# Patient Record
Sex: Male | Born: 1950 | Race: White | Hispanic: No | Smoking: Former smoker
Health system: Southern US, Community
[De-identification: ages and names within clinical notes are randomized; demographics above are authoritative.]

## PROBLEM LIST (undated history)

## (undated) DIAGNOSIS — J449 Chronic obstructive pulmonary disease, unspecified: Secondary | ICD-10-CM

## (undated) DIAGNOSIS — I2510 Atherosclerotic heart disease of native coronary artery without angina pectoris: Secondary | ICD-10-CM

## (undated) DIAGNOSIS — N189 Chronic kidney disease, unspecified: Secondary | ICD-10-CM

## (undated) DIAGNOSIS — I509 Heart failure, unspecified: Secondary | ICD-10-CM

## (undated) DIAGNOSIS — M1990 Unspecified osteoarthritis, unspecified site: Secondary | ICD-10-CM

## (undated) DIAGNOSIS — I10 Essential (primary) hypertension: Secondary | ICD-10-CM

## (undated) DIAGNOSIS — E785 Hyperlipidemia, unspecified: Secondary | ICD-10-CM

## (undated) DIAGNOSIS — K219 Gastro-esophageal reflux disease without esophagitis: Secondary | ICD-10-CM

## (undated) HISTORY — PX: CARDIAC CATHETERIZATION: SHX172

## (undated) HISTORY — PX: TONSILLECTOMY: SUR1361

## (undated) HISTORY — DX: Essential (primary) hypertension: I10

## (undated) HISTORY — DX: Atherosclerotic heart disease of native coronary artery without angina pectoris: I25.10

## (undated) HISTORY — DX: Heart failure, unspecified: I50.9

## (undated) HISTORY — PX: KNEE SURGERY: SHX244

---

## 2018-09-02 LAB — BASIC METABOLIC PANEL
BUN: 15 (ref 4–21)
CO2: 25 — AB (ref 13–22)
Chloride: 111 — AB (ref 99–108)
Creatinine: 1.2 (ref 0.6–1.3)
Glucose: 107
Potassium: 4.4 (ref 3.4–5.3)
Sodium: 141 (ref 137–147)

## 2018-09-02 LAB — COMPREHENSIVE METABOLIC PANEL
Calcium: 8.9 (ref 8.7–10.7)
GFR calc non Af Amer: >60

## 2018-09-08 LAB — PULMONARY FUNCTION TEST

## 2018-09-09 LAB — PROTIME-INR: INR: 1 (ref 0.9–1.1)

## 2019-04-20 LAB — PULMONARY FUNCTION TEST

## 2019-09-02 ENCOUNTER — Other Ambulatory Visit: Payer: Self-pay

## 2019-09-02 ENCOUNTER — Ambulatory Visit (INDEPENDENT_AMBULATORY_CARE_PROVIDER_SITE_OTHER): Payer: Medicare Other | Admitting: Family Medicine

## 2019-09-02 ENCOUNTER — Encounter: Payer: Self-pay | Admitting: Family Medicine

## 2019-09-02 VITALS — BP 120/76 | HR 80 | Temp 97.4°F | Ht 69.75 in | Wt 175.0 lb

## 2019-09-02 DIAGNOSIS — I2510 Atherosclerotic heart disease of native coronary artery without angina pectoris: Secondary | ICD-10-CM | POA: Insufficient documentation

## 2019-09-02 DIAGNOSIS — I10 Essential (primary) hypertension: Secondary | ICD-10-CM | POA: Diagnosis not present

## 2019-09-02 DIAGNOSIS — E785 Hyperlipidemia, unspecified: Secondary | ICD-10-CM | POA: Insufficient documentation

## 2019-09-02 DIAGNOSIS — E782 Mixed hyperlipidemia: Secondary | ICD-10-CM | POA: Diagnosis not present

## 2019-09-02 DIAGNOSIS — Z955 Presence of coronary angioplasty implant and graft: Secondary | ICD-10-CM | POA: Diagnosis not present

## 2019-09-02 DIAGNOSIS — J449 Chronic obstructive pulmonary disease, unspecified: Secondary | ICD-10-CM | POA: Diagnosis not present

## 2019-09-02 DIAGNOSIS — J439 Emphysema, unspecified: Secondary | ICD-10-CM | POA: Insufficient documentation

## 2019-09-02 DIAGNOSIS — R911 Solitary pulmonary nodule: Secondary | ICD-10-CM

## 2019-09-02 DIAGNOSIS — I351 Nonrheumatic aortic (valve) insufficiency: Secondary | ICD-10-CM | POA: Insufficient documentation

## 2019-09-02 DIAGNOSIS — I509 Heart failure, unspecified: Secondary | ICD-10-CM | POA: Insufficient documentation

## 2019-09-02 DIAGNOSIS — Z125 Encounter for screening for malignant neoplasm of prostate: Secondary | ICD-10-CM

## 2019-09-02 NOTE — Assessment & Plan Note (Addendum)
Symptoms improved on Breo. Cont treatment. If SOB worsens may refer to pulmonology given prior lung functioning.

## 2019-09-02 NOTE — Assessment & Plan Note (Signed)
Continue dual therapy. Referral to cardiology given extent of stents and recent placement to discuss duration of dual agent therapy. Per outside recommendation - at least 1 year.

## 2019-09-02 NOTE — Assessment & Plan Note (Signed)
BP controled. Wonder if fatigue may due to carvedilol. Cardiology referral - recommend discussing trial of dose decrease with them to see if fatigue improves.

## 2019-09-02 NOTE — Patient Instructions (Addendum)
#  Referral I have placed a referral to a specialist for you. You should receive a phone call from the specialty office. Make sure your voicemail is not full and that if you are able to answer your phone to unknown or new numbers.   It may take up to 2 weeks to hear about the referral. If you do not hear anything in 2 weeks, please call our office and ask to speak with the referral coordinator.   Update pharmacy to let me them know you've changed providers

## 2019-09-02 NOTE — Assessment & Plan Note (Signed)
Reviewed outside imaging - follow-up PET reassuring. Given smoking hx will recommend annual low dose CT screening.

## 2019-09-02 NOTE — Progress Notes (Signed)
Subjective:     Caleb House is a 69 y.o. male presenting for Establish Care (Patient states he would like referral to Cardiologist)     HPI  #Hx of Stent placement x 3 - last year - on dual therapy - on BB - noticing some fatigue lately  #COPD - diagnosed and seen by pulm - hx of smoking - also had lung nodule   Review of Systems   Social History   Tobacco Use  Smoking Status Former Smoker  . Packs/day: 0.75  . Years: 40.00  . Pack years: 30.00  . Types: Cigarettes  . Quit date: 08/23/2018  . Years since quitting: 1.0  Smokeless Tobacco Never Used        Objective:    BP Readings from Last 3 Encounters:  09/02/19 120/76   Wt Readings from Last 3 Encounters:  09/02/19 175 lb (79.4 kg)    BP 120/76 (BP Location: Left Arm, Patient Position: Sitting, Cuff Size: Normal)   Pulse 80   Temp (!) 97.4 F (36.3 C) (Temporal)   Ht 5' 9.75" (1.772 m)   Wt 175 lb (79.4 kg)   SpO2 99%   BMI 25.29 kg/m    Physical Exam Constitutional:      Appearance: Normal appearance. He is not ill-appearing or diaphoretic.  HENT:     Right Ear: External ear normal.     Left Ear: External ear normal.     Nose: Nose normal.  Eyes:     General: No scleral icterus.    Extraocular Movements: Extraocular movements intact.     Conjunctiva/sclera: Conjunctivae normal.  Cardiovascular:     Rate and Rhythm: Normal rate.  Pulmonary:     Effort: Pulmonary effort is normal.  Musculoskeletal:     Cervical back: Neck supple.  Skin:    General: Skin is warm and dry.  Neurological:     Mental Status: He is alert. Mental status is at baseline.  Psychiatric:        Mood and Affect: Mood normal.           Assessment & Plan:   Problem List Items Addressed This Visit      Cardiovascular and Mediastinum   Essential hypertension    BP controled. Wonder if fatigue may due to carvedilol. Cardiology referral - recommend discussing trial of dose decrease with them to see if  fatigue improves.       Relevant Medications   carvedilol (COREG) 3.125 MG tablet   amLODipine (NORVASC) 5 MG tablet   rosuvastatin (CRESTOR) 40 MG tablet   telmisartan (MICARDIS) 40 MG tablet   aspirin EC 81 MG tablet   Other Relevant Orders   Ambulatory referral to Cardiology   Comprehensive metabolic panel   CBC     Respiratory   COPD (chronic obstructive pulmonary disease) (HCC)    Symptoms improved on Breo. Cont treatment. If SOB worsens may refer to pulmonology given prior lung functioning.       Relevant Medications   BREO ELLIPTA 100-25 MCG/INH AEPB     Other   S/P coronary artery stent placement - Primary    Continue dual therapy. Referral to cardiology given extent of stents and recent placement to discuss duration of dual agent therapy. Per outside recommendation - at least 1 year.       Relevant Orders   Ambulatory referral to Cardiology   CBC   Hyperlipidemia   Relevant Medications   carvedilol (COREG) 3.125 MG tablet  amLODipine (NORVASC) 5 MG tablet   rosuvastatin (CRESTOR) 40 MG tablet   telmisartan (MICARDIS) 40 MG tablet   aspirin EC 81 MG tablet   Other Relevant Orders   Lipid panel   CBC   Nodule of lower lobe of right lung    Reviewed outside imaging - follow-up PET reassuring. Given smoking hx will recommend annual low dose CT screening.        Other Visit Diagnoses    Prostate cancer screening       Relevant Orders   PSA, Medicare       Return in about 1 year (around 09/01/2020).  Lynnda Child, MD  This visit occurred during the SARS-CoV-2 public health emergency.  Safety protocols were in place, including screening questions prior to the visit, additional usage of staff PPE, and extensive cleaning of exam room while observing appropriate contact time as indicated for disinfecting solutions.

## 2019-09-09 ENCOUNTER — Encounter: Payer: Self-pay | Admitting: Family Medicine

## 2019-09-09 ENCOUNTER — Encounter: Payer: Self-pay | Admitting: Cardiology

## 2019-09-09 DIAGNOSIS — Z7189 Other specified counseling: Secondary | ICD-10-CM | POA: Insufficient documentation

## 2019-09-09 NOTE — Progress Notes (Deleted)
Cardiology Office Note   Date:  09/09/2019   ID:  Caleb House, DOB Sep 22, 1950, MRN [id number]  PCP:  Lynnda Child, MD  Cardiologist:   No primary care provider on file. Referring:  ***  No chief complaint on file.     History of Present Illness: Caleb House is a 69 y.o. male who presents for ***     Past Medical History:  Diagnosis Date  . Allergy   . CHF (congestive heart failure) (HCC)   . Heart disease     Past Surgical History:  Procedure Laterality Date  . CARDIAC CATHETERIZATION     7 stents placed  . KNEE SURGERY     Right knee  . TONSILLECTOMY       Current Outpatient Medications  Medication Sig Dispense Refill  . acetaminophen (TYLENOL) 500 MG tablet Take 500 mg by mouth every 6 (six) hours as needed.    [name] amLODipine (NORVASC) 5 MG tablet Take 5 mg by mouth daily.    [name] aspirin EC 81 MG tablet Take 81 mg by mouth daily. Swallow whole.    [name] BREO ELLIPTA 100-25 MCG/INH AEPB Inhale 1 puff into the lungs daily.    . carvedilol (COREG) 3.125 MG tablet Take 3.125 mg by mouth 2 (two) times daily.    . clopidogrel (PLAVIX) 75 MG tablet Take 75 mg by mouth daily.    . Multiple Vitamin (MULTIVITAMIN) tablet Take 1 tablet by mouth daily.    . naproxen (NAPROSYN) 500 MG tablet Take 500 mg by mouth 2 (two) times daily with a meal.    . rosuvastatin (CRESTOR) 40 MG tablet Take 40 mg by mouth daily.    [name] telmisartan (MICARDIS) 40 MG tablet Take 40 mg by mouth daily.     No current facility-administered medications for this visit.    Allergies:   Patient has no known allergies.    Social History:  The patient  reports that he quit smoking about 12 months ago. His smoking use included cigarettes. He has a 30.00 pack-year smoking history. He has never used smokeless tobacco. He reports previous alcohol use. He reports that he does not use drugs.   Family History:  The patient's ***family history includes Alcohol abuse in his mother; Depression in his mother;  Heart attack (age of onset: 45) in his paternal grandfather; Heart disease in his brother and father; Suicidality in his maternal grandfather and mother.    ROS:  Please see the history of present illness.   Otherwise, review of systems are positive for {NONE DEFAULTED:18576::"none"}.   All other systems are reviewed and negative.    PHYSICAL EXAM: VS:  There were no vitals taken for this visit. , BMI There is no height or weight on file to calculate BMI. GENERAL:  Well appearing HEENT:  Pupils equal round and reactive, fundi not visualized, oral mucosa unremarkable NECK:  No jugular venous distention, waveform within normal limits, carotid upstroke brisk and symmetric, no bruits, no thyromegaly LYMPHATICS:  No cervical, inguinal adenopathy LUNGS:  Clear to auscultation bilaterally BACK:  No CVA tenderness CHEST:  Unremarkable HEART:  PMI not displaced or sustained,S1 and S2 within normal limits, no S3, no S4, no clicks, no rubs, *** murmurs ABD:  Flat, positive bowel sounds normal in frequency in pitch, no bruits, no rebound, no guarding, no midline pulsatile mass, no hepatomegaly, no splenomegaly EXT:  2 plus pulses throughout, no edema, no cyanosis no clubbing SKIN:  No rashes no nodules  NEURO:  Cranial nerves II through XII grossly intact, motor grossly intact throughout PSYCH:  Cognitively intact, oriented to person place and time    EKG:  EKG {ACTION; IS/IS TDV:76160737} ordered today. The ekg ordered today demonstrates ***   Recent Labs: No results found for requested labs within last 8760 hours.    Lipid Panel No results found for: CHOL, TRIG, HDL, CHOLHDL, VLDL, LDLCALC, LDLDIRECT    Wt Readings from Last 3 Encounters:  09/02/19 175 lb (79.4 kg)      Other studies Reviewed: Additional studies/ records that were reviewed today include: ***. Review of the above records demonstrates:  Please see elsewhere in the note.  ***   ASSESSMENT AND PLAN:  CAD:   ***  DYSLIPIDEMIA:  ***    COVID EDUCATION:  ***    Current medicines are reviewed at length with the patient today.  The patient {ACTIONS; HAS/DOES NOT HAVE:19233} concerns regarding medicines.  The following changes have been made:  {PLAN; NO CHANGE:13088:s}  Labs/ tests ordered today include: *** No orders of the defined types were placed in this encounter.    Disposition:   FU with ***    Signed, Rollene Rotunda, MD  09/09/2019 8:12 PM    [facility]

## 2019-09-10 ENCOUNTER — Other Ambulatory Visit: Payer: Self-pay

## 2019-09-10 ENCOUNTER — Ambulatory Visit (INDEPENDENT_AMBULATORY_CARE_PROVIDER_SITE_OTHER): Payer: Medicare Other | Admitting: Cardiology

## 2019-09-10 ENCOUNTER — Other Ambulatory Visit (INDEPENDENT_AMBULATORY_CARE_PROVIDER_SITE_OTHER): Payer: Medicare Other

## 2019-09-10 ENCOUNTER — Encounter: Payer: Self-pay | Admitting: Cardiology

## 2019-09-10 VITALS — BP 134/80 | HR 56 | Temp 97.5°F | Ht 70.0 in | Wt 180.0 lb

## 2019-09-10 DIAGNOSIS — E782 Mixed hyperlipidemia: Secondary | ICD-10-CM

## 2019-09-10 DIAGNOSIS — Z7189 Other specified counseling: Secondary | ICD-10-CM | POA: Diagnosis not present

## 2019-09-10 DIAGNOSIS — Z125 Encounter for screening for malignant neoplasm of prostate: Secondary | ICD-10-CM

## 2019-09-10 DIAGNOSIS — R0989 Other specified symptoms and signs involving the circulatory and respiratory systems: Secondary | ICD-10-CM

## 2019-09-10 DIAGNOSIS — E785 Hyperlipidemia, unspecified: Secondary | ICD-10-CM

## 2019-09-10 DIAGNOSIS — F17200 Nicotine dependence, unspecified, uncomplicated: Secondary | ICD-10-CM

## 2019-09-10 DIAGNOSIS — I10 Essential (primary) hypertension: Secondary | ICD-10-CM

## 2019-09-10 DIAGNOSIS — I2510 Atherosclerotic heart disease of native coronary artery without angina pectoris: Secondary | ICD-10-CM

## 2019-09-10 DIAGNOSIS — Z955 Presence of coronary angioplasty implant and graft: Secondary | ICD-10-CM

## 2019-09-10 LAB — CBC
HCT: 36.5 % — ABNORMAL LOW (ref 39.0–52.0)
Hemoglobin: 12 g/dL — ABNORMAL LOW (ref 13.0–17.0)
MCHC: 32.9 g/dL (ref 30.0–36.0)
MCV: 89.3 fl (ref 78.0–100.0)
Platelets: 207 10*3/uL (ref 150.0–400.0)
RBC: 4.09 Mil/uL — ABNORMAL LOW (ref 4.22–5.81)
RDW: 14.5 % (ref 11.5–15.5)
WBC: 5.3 10*3/uL (ref 4.0–10.5)

## 2019-09-10 LAB — COMPREHENSIVE METABOLIC PANEL
ALT: 22 U/L (ref 0–53)
AST: 16 U/L (ref 0–37)
Albumin: 4 g/dL (ref 3.5–5.2)
Alkaline Phosphatase: 61 U/L (ref 39–117)
BUN: 27 mg/dL — ABNORMAL HIGH (ref 6–23)
CO2: 24 mEq/L (ref 19–32)
Calcium: 9.1 mg/dL (ref 8.4–10.5)
Chloride: 108 mEq/L (ref 96–112)
Creatinine, Ser: 1.44 mg/dL (ref 0.40–1.50)
GFR: 48.37 mL/min — ABNORMAL LOW (ref >60.00)
Glucose, Bld: 110 mg/dL — ABNORMAL HIGH (ref 70–99)
Potassium: 4.6 mEq/L (ref 3.5–5.1)
Sodium: 138 mEq/L (ref 135–145)
Total Bilirubin: 0.6 mg/dL (ref 0.2–1.2)
Total Protein: 6 g/dL (ref 6.0–8.3)

## 2019-09-10 LAB — LIPID PANEL
Cholesterol: 119 mg/dL (ref 0–200)
HDL: 44 mg/dL (ref >39.00)
LDL Cholesterol: 62 mg/dL (ref 0–99)
NonHDL: 74.66
Total CHOL/HDL Ratio: 3
Triglycerides: 65 mg/dL (ref 0.0–149.0)
VLDL: 13 mg/dL (ref 0.0–40.0)

## 2019-09-10 LAB — PSA, MEDICARE: PSA: 1.57 ng/ml (ref 0.10–4.00)

## 2019-09-10 MED ORDER — ENTRESTO 24-26 MG PO TABS: 1.0000 | ORAL_TABLET | Freq: Two times a day (BID) | ORAL | 11 refills | Status: DC

## 2019-09-10 MED ORDER — AMLODIPINE BESYLATE 2.5 MG PO TABS
5.0000 mg | ORAL_TABLET | Freq: Every day | ORAL | 11 refills | Status: DC
Start: 2019-09-10 — End: 2019-10-22

## 2019-09-10 NOTE — Patient Instructions (Signed)
Medication Instructions:  Stop Micardis Start Entresto 24-26mg , take 1 tablet twice a day Decrease Norvasc to 2.5mg  daily *If you need a refill on your cardiac medications before your next appointment, please call your pharmacy*  Lab Work: None ordered this visit  Testing/Procedures: Your physician has requested that you have an abdominal aorta duplex. During this test, an ultrasound is used to evaluate the aorta. Allow 30 minutes for this exam. Do not eat after midnight the day before and avoid carbonated beverages.  Your physician has requested that you have a carotid duplex. This test is an ultrasound of the carotid arteries in your neck. It looks at blood flow through these arteries that supply the brain with blood. Allow one hour for this exam. There are no restrictions or special instructions.  Follow-Up: At [facility], you and your health needs are our priority.  As part of our continuing mission to provide you with exceptional heart care, we have created designated Provider Care Teams.  These Care Teams include your primary Cardiologist (physician) and Advanced Practice Providers (APPs -  Physician Assistants and Nurse Practitioners) who all work together to provide you with the care you need, when you need it.  Your next appointment:   1 month(s)  The format for your next appointment:   In Person  Provider:   Rollene Rotunda, MD

## 2019-09-10 NOTE — Progress Notes (Signed)
Cardiology Office Note   Date:  09/10/2019   ID:  Caleb House, DOB Dec 13, 1950, MRN [id number]  PCP:  Lynnda Child, MD  Cardiologist:   No primary care provider on file. Referring:  Lynnda Child, MD  Chief Complaint  Patient presents with  . Coronary Artery Disease      History of Present Illness: Caleb House is a 69 y.o. male who is referred by Lynnda Child, MD for evaluation of CAD.  He came for [facility].  I do have some records to review.  He has had severe coronary artery disease.  He had LAD and RCA chronic total obstruction and high-grade disease in his circumflex treated with PCI on September 09, 2018.[name] He was turned down for bypass because of his severe COPD.  His ejection fraction in July 2020 was 38%.  He did have PET scanning that demonstrated moderate sized full-thickness defect in the mid to distal anterior wall and apex.  He had viable anterior and lateral wall.  He had LAD CTO revascularization.  He had ADR/Sting-Ray on October 23, 2018.   He had staged RCA CTO revascularization on January 01, 2019.  Of note he could not tolerate Brilinta secondary to shortness of breath.  His ejection fraction was 38%.  I do see labs from the fall and he had normal renal function.  Interestingly the patient never really had symptoms associated with this.  He was just found to have an abnormal EKG.  In retrospect he had some episodes bad heartburn he thought.  He had some chronic mild dyspnea.  However, he never really knew that he had angina.  Since having all of this done he has done well.  He has had some fatigue he thinks related to starting his carvedilol.  He has not been having any chest pressure, neck or arm discomfort.  He has not been having any new shortness of breath, PND or orthopnea.  He has had no weight gain or edema.  He has been exercising routinely using a [employer].    Past Medical History:  Diagnosis Date  . CAD (coronary artery disease)   . CHF (congestive  heart failure) (HCC)   . HTN (hypertension)     Past Surgical History:  Procedure Laterality Date  . CARDIAC CATHETERIZATION     7 stents placed  . KNEE SURGERY     Right knee  . TONSILLECTOMY       Current Outpatient Medications  Medication Sig Dispense Refill  . acetaminophen (TYLENOL) 500 MG tablet Take 500 mg by mouth every 6 (six) hours as needed.    [name] amLODipine (NORVASC) 2.5 MG tablet Take 2 tablets (5 mg total) by mouth daily. 30 tablet 11  . aspirin EC 81 MG tablet Take 81 mg by mouth daily. Swallow whole.    [name] BREO ELLIPTA 100-25 MCG/INH AEPB Inhale 1 puff into the lungs daily.    . carvedilol (COREG) 3.125 MG tablet Take 3.125 mg by mouth 2 (two) times daily.    . clopidogrel (PLAVIX) 75 MG tablet Take 75 mg by mouth daily.    . Multiple Vitamin (MULTIVITAMIN) tablet Take 1 tablet by mouth daily.    . naproxen (NAPROSYN) 500 MG tablet Take 500 mg by mouth 2 (two) times daily with a meal.    . rosuvastatin (CRESTOR) 40 MG tablet Take 40 mg by mouth daily.    . sacubitril-valsartan (ENTRESTO) 24-26 MG Take 1 tablet by mouth 2 (two)  times daily. 60 tablet 11   No current facility-administered medications for this visit.    Allergies:   Patient has no known allergies.    Social History:  The patient  reports that he quit smoking about 12 months ago. His smoking use included cigarettes. He has a 30.00 pack-year smoking history. He has never used smokeless tobacco. He reports previous alcohol use. He reports that he does not use drugs.   Family History:  The patient's family history includes Alcohol abuse in his mother; Depression in his mother; Heart attack (age of onset: 16) in his paternal grandfather; Heart disease in his brother and father; Suicidality in his maternal grandfather and mother.    ROS:  Please see the history of present illness.   Otherwise, review of systems are positive for none.   All other systems are reviewed and negative.    PHYSICAL EXAM: VS:   BP 134/80   Pulse (!) 56   Temp (!) 97.5 F (36.4 C)   Ht 5\' 10"  (1.778 m)   Wt 180 lb (81.6 kg)   SpO2 98%   BMI 25.83 kg/m  , BMI Body mass index is 25.83 kg/m. GENERAL:  Well appearing HEENT:  Pupils equal round and reactive, fundi not visualized, oral mucosa unremarkable NECK:  No jugular venous distention, waveform within normal limits, carotid upstroke brisk and symmetric, positive bruits, no thyromegaly LYMPHATICS:  No cervical, inguinal adenopathy LUNGS:  Clear to auscultation bilaterally BACK:  No CVA tenderness CHEST:  Unremarkable HEART:  PMI not displaced or sustained,S1 and S2 within normal limits, no S3, no S4, no clicks, no rubs, no murmurs ABD:  Flat, positive bowel sounds normal in frequency in pitch, no bruits, no rebound, no guarding, no midline pulsatile mass, no hepatomegaly, no splenomegaly EXT:  2 plus pulses throughout, no edema, no cyanosis no clubbing SKIN:  No rashes no nodules NEURO:  Cranial nerves II through XII grossly intact, motor grossly intact throughout PSYCH:  Cognitively intact, oriented to person place and time    EKG:  EKG is ordered today. The ekg ordered today demonstrates sinus rhythm, rate 56, axis within normal limits, intervals within normal limits, old anteroseptal infarct, lateral T wave inversions, no old EKGs for comparison.   Recent Labs: No results found for requested labs within last 8760 hours.    Lipid Panel No results found for: CHOL, TRIG, HDL, CHOLHDL, VLDL, LDLCALC, LDLDIRECT    Wt Readings from Last 3 Encounters:  09/10/19 180 lb (81.6 kg)  09/02/19 175 lb (79.4 kg)      Other studies Reviewed: Additional studies/ records that were reviewed today include: Extensive review of [facility] records. Review of the above records demonstrates:  Please see elsewhere in the note.     ASSESSMENT AND PLAN:  CAD:   The patient had significant three-vessel coronary disease with complicated revascularization as above.  He  has 7 stents reportedly no I do not have these details I do have some of the records.  I am going to continue DAPT indefinitely.  We are going to continue with aggressive risk reduction.  DYSLIPIDEMIA: He has a lipid profile that is drawn and I will be wanting to review this with an LDL goal less than 70.  CARDIOMYOPATHY: He has an ischemic cardiomyopathy.  I am going to work to reduce his Norvasc and eliminated.  I will start by reducing it to 2.5 mg daily.  I am going to stop his Micardis and start Entresto 24/26  twice daily.  He has normal renal function.  AAA SCREENING: I will order screening because of his smoking history.  BRUITS: We will get carotid Dopplers.  COVID EDUCATION: He has been vaccinated.   Current medicines are reviewed at length with the patient today.  The patient does not have concerns regarding medicines.  The following changes have been made: As above.  Labs/ tests ordered today include:   Orders Placed This Encounter  Procedures  . EKG 12-Lead  . VAS US CAROTID  . VAS [place] AAA DUPLEX     Disposition:   FU with me in one month.     Signed, Rollene Rotunda, MD  09/10/2019 2:31 PM    [facility]

## 2019-09-11 DIAGNOSIS — H3530 Unspecified macular degeneration: Secondary | ICD-10-CM | POA: Insufficient documentation

## 2019-10-07 ENCOUNTER — Other Ambulatory Visit: Payer: Self-pay

## 2019-10-07 ENCOUNTER — Other Ambulatory Visit: Payer: Self-pay | Admitting: Cardiology

## 2019-10-07 ENCOUNTER — Ambulatory Visit (HOSPITAL_COMMUNITY)
Admission: RE | Admit: 2019-10-07 | Discharge: 2019-10-07 | Disposition: A | Discharge status: Home / Self Care | Payer: Medicare Other | Source: Ambulatory Visit | Attending: Cardiology | Admitting: Cardiology

## 2019-10-07 ENCOUNTER — Ambulatory Visit (HOSPITAL_BASED_OUTPATIENT_CLINIC_OR_DEPARTMENT_OTHER)
Admission: RE | Admit: 2019-10-07 | Discharge: 2019-10-07 | Disposition: A | Discharge status: Home / Self Care | Payer: Medicare Other | Source: Ambulatory Visit | Attending: Cardiology | Admitting: Cardiology

## 2019-10-07 DIAGNOSIS — I6523 Occlusion and stenosis of bilateral carotid arteries: Secondary | ICD-10-CM

## 2019-10-07 DIAGNOSIS — R0989 Other specified symptoms and signs involving the circulatory and respiratory systems: Secondary | ICD-10-CM | POA: Diagnosis present

## 2019-10-07 DIAGNOSIS — F17200 Nicotine dependence, unspecified, uncomplicated: Secondary | ICD-10-CM

## 2019-10-07 DIAGNOSIS — I700 Atherosclerosis of aorta: Secondary | ICD-10-CM | POA: Insufficient documentation

## 2019-10-20 NOTE — Progress Notes (Signed)
Cardiology Office Note   Date:  10/22/2019   ID:  Caleb House, DOB [birth year], MRN [id number]  PCP:  Lynnda Child, MD  Cardiologist:   No primary care provider on file. Referring:  Lynnda Child, MD  Chief Complaint  Patient presents with   Cardiomyopathy      History of Present Illness: Caleb House is a 69 y.o. male who is referred by Lynnda Child, MD for evaluation of CAD.  He came for [facility].   He has had severe coronary artery disease.  He had LAD and RCA chronic total obstruction and high-grade disease in his circumflex treated with PCI on September 09, 2018.[name] He was turned down for bypass because of his severe COPD.  His ejection fraction in July 2020 was 38%.  He did have PET scanning that demonstrated moderate sized full-thickness defect in the mid to distal anterior wall and apex.  He had viable anterior and lateral wall.  He had LAD CTO revascularization.  He had ADR/Sting-Ray on October 23, 2018.   He had staged RCA CTO revascularization on January 01, 2019.  Of note he could not tolerate Brilinta secondary to shortness of breath.  His ejection fraction was 38%.  I do see labs from the fall and he had normal renal function.  At my first visit with him I reduced his Norvasc.  Stopped the Micardis and started [employer].  He has felt well since that time.  He denies any cardiovascular symptoms.  He has no new shortness of breath, PND or orthopnea.  He has had no palpitations, presyncope or syncope.  He tolerates the meds as listed despite having a low heart rate.   Past Medical History:  Diagnosis Date   CAD (coronary artery disease)    CHF (congestive heart failure) (HCC)    HTN (hypertension)     Past Surgical History:  Procedure Laterality Date   CARDIAC CATHETERIZATION     7 stents placed   KNEE SURGERY     Right knee   TONSILLECTOMY       Current Outpatient Medications  Medication Sig Dispense Refill   acetaminophen (TYLENOL) 500 MG tablet  Take 500 mg by mouth every 6 (six) hours as needed.     aspirin EC 81 MG tablet Take 81 mg by mouth daily. Swallow whole.     BREO ELLIPTA 100-25 MCG/INH AEPB Inhale 1 puff into the lungs daily.     carvedilol (COREG) 3.125 MG tablet Take 3.125 mg by mouth 2 (two) times daily.     clopidogrel (PLAVIX) 75 MG tablet Take 75 mg by mouth daily.     Multiple Vitamin (MULTIVITAMIN) tablet Take 1 tablet by mouth daily.     naproxen (NAPROSYN) 500 MG tablet Take 500 mg by mouth 2 (two) times daily with a meal.     rosuvastatin (CRESTOR) 40 MG tablet Take 40 mg by mouth daily.     sacubitril-valsartan (ENTRESTO) 49-51 MG Take 1 tablet by mouth 2 (two) times daily. 60 tablet 1   No current facility-administered medications for this visit.    Allergies:   Patient has no known allergies.    ROS:  Please see the history of present illness.   Otherwise, review of systems are positive for none.   All other systems are reviewed and negative.    PHYSICAL EXAM: VS:  BP 132/68    Pulse (!) 46    Ht 5\' 10"  (1.778 m)    Wt  177 lb 12.8 oz (80.6 kg)    SpO2 98%    BMI 25.51 kg/m  , BMI Body mass index is 25.51 kg/m. GENERAL:  Well appearing NECK:  No jugular venous distention, waveform within normal limits, carotid upstroke brisk and symmetric, no bruits, no thyromegaly LUNGS:  Clear to auscultation bilaterally CHEST:  Unremarkable HEART:  PMI not displaced or sustained,S1 and S2 within normal limits, no S3, no S4, no clicks, no rubs, no murmurs ABD:  Flat, positive bowel sounds normal in frequency in pitch, no bruits, no rebound, no guarding, no midline pulsatile mass, no hepatomegaly, no splenomegaly EXT:  2 plus pulses throughout, no edema, no cyanosis no clubbing   EKG:  EKG is not ordered today.   Recent Labs: 09/10/2019: ALT 22; BUN 27; Creatinine, Ser 1.44; Hemoglobin 12.0; Platelets 207.0; Potassium 4.6; Sodium 138    Lipid Panel    Component Value Date/Time   CHOL 119 09/10/2019  0825   TRIG 65.0 09/10/2019 0825   HDL 44.00 09/10/2019 0825   CHOLHDL 3 09/10/2019 0825   VLDL 13.0 09/10/2019 0825   LDLCALC 62 09/10/2019 0825      Wt Readings from Last 3 Encounters:  10/22/19 177 lb 12.8 oz (80.6 kg)  09/10/19 180 lb (81.6 kg)  09/02/19 175 lb (79.4 kg)      Other studies Reviewed: Additional studies/ records that were reviewed today include: Vascular studies Review of the above records demonstrates:  Please see elsewhere in the note.     ASSESSMENT AND PLAN:  CAD:   I will continue DAPT indefinitely.  No change in therapy other than below.   DYSLIPIDEMIA:   LDL was 62.  He will continue the meds as listed.   CARDIOMYOPATHY:    I am going to stop his amlodipine.  I am going to increase his Entresto to 47/51 twice daily.  AAA SCREENING:  He had no evidence of aneurysm.    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  I will follow in Sept of next year.    COVID EDUCATION: He has been vaccinated.     Current medicines are reviewed at length with the patient today.  The patient does not have concerns regarding medicines.  The following changes have been made:  As above  Labs/ tests ordered today include: NA  No orders of the defined types were placed in this encounter.    Disposition:   FU with me in 1 month.     Signed, Rollene Rotunda, MD  10/22/2019 12:25 PM    [facility]

## 2019-10-22 ENCOUNTER — Ambulatory Visit (INDEPENDENT_AMBULATORY_CARE_PROVIDER_SITE_OTHER): Payer: Medicare Other | Admitting: Cardiology

## 2019-10-22 ENCOUNTER — Other Ambulatory Visit: Payer: Self-pay

## 2019-10-22 ENCOUNTER — Encounter: Payer: Self-pay | Admitting: Cardiology

## 2019-10-22 VITALS — BP 132/68 | HR 46 | Ht 70.0 in | Wt 177.8 lb

## 2019-10-22 DIAGNOSIS — Z7189 Other specified counseling: Secondary | ICD-10-CM | POA: Diagnosis not present

## 2019-10-22 DIAGNOSIS — I255 Ischemic cardiomyopathy: Secondary | ICD-10-CM | POA: Diagnosis not present

## 2019-10-22 DIAGNOSIS — I2510 Atherosclerotic heart disease of native coronary artery without angina pectoris: Secondary | ICD-10-CM | POA: Diagnosis not present

## 2019-10-22 DIAGNOSIS — E785 Hyperlipidemia, unspecified: Secondary | ICD-10-CM

## 2019-10-22 MED ORDER — ENTRESTO 49-51 MG PO TABS: 1.0000 | ORAL_TABLET | Freq: Two times a day (BID) | ORAL | 1 refills | Status: DC

## 2019-10-22 NOTE — Patient Instructions (Signed)
Medication Instructions:  STOP amlodipine  INCREASE Entresto to 49/51 mg two times daily  *If you need a refill on your cardiac medications before your next appointment, please call your pharmacy*   Follow-Up: At [facility], you and your health needs are our priority.  As part of our continuing mission to provide you with exceptional heart care, we have created designated Provider Care Teams.  These Care Teams include your primary Cardiologist (physician) and Advanced Practice Providers (APPs -  Physician Assistants and Nurse Practitioners) who all work together to provide you with the care you need, when you need it.  We recommend signing up for the patient portal called "MyChart".  Sign up information is provided on this After Visit Summary.  MyChart is used to connect with patients for Virtual Visits (Telemedicine).  Patients are able to view lab/test results, encounter notes, upcoming appointments, etc.  Non-urgent messages can be sent to your provider as well.   To learn more about what you can do with MyChart, go to [email or web address].    Your next appointment:   1 month(s)  The format for your next appointment:   In Person  Provider:   Rollene Rotunda, MD

## 2019-11-23 ENCOUNTER — Ambulatory Visit: Payer: Medicare Other | Admitting: Cardiology

## 2019-11-23 DIAGNOSIS — I6523 Occlusion and stenosis of bilateral carotid arteries: Secondary | ICD-10-CM | POA: Insufficient documentation

## 2019-11-23 NOTE — Progress Notes (Signed)
Cardiology Office Note   Date:  11/25/2019   ID:  Caleb House, DOB 11-25-1950, MRN [id number]  PCP:  Caleb Child, MD  Cardiologist:   No primary care provider on file. Referring:  Caleb Child, MD  Chief Complaint  Patient presents with  . Cardiomyopathy      History of Present Illness: Caleb House is a 69 y.o. male who is referred by Caleb Child, MD for evaluation of CAD.  He came for [facility].   He has had severe coronary artery disease.  He had LAD and RCA chronic total obstruction and high-grade disease in his circumflex treated with PCI on September 09, 2018.[name] He was turned down for bypass because of his severe COPD.  His ejection fraction in July 2020 was 38%.  He did have PET scanning that demonstrated moderate sized full-thickness defect in the mid to distal anterior wall and apex.  He had viable anterior and lateral wall.  He had LAD CTO revascularization.  He had ADR/Sting-Ray on October 23, 2018.   He had staged RCA CTO revascularization on January 01, 2019.  Of note he could not tolerate Brilinta secondary to shortness of breath.  His ejection fraction was 38%.  I do see labs from the fall and he had normal renal function.  I have been titrating his medications.  At the last visit I increased Entresto.   He did well with this.  He has had no lightheadedness except when he might bend over very quickly.  Otherwise feels well. The patient denies any new symptoms such as chest discomfort, neck or arm discomfort. There has been no new shortness of breath, PND or orthopnea. There have been no reported palpitations, presyncope or syncope.   Past Medical History:  Diagnosis Date  . CAD (coronary artery disease)   . CHF (congestive heart failure) (HCC)   . HTN (hypertension)     Past Surgical History:  Procedure Laterality Date  . CARDIAC CATHETERIZATION     7 stents placed  . KNEE SURGERY     Right knee  . TONSILLECTOMY       Current Outpatient Medications   Medication Sig Dispense Refill  . aspirin EC 81 MG tablet Take 81 mg by mouth daily. Swallow whole.    [name] BREO ELLIPTA 100-25 MCG/INH AEPB Inhale 1 puff into the lungs daily.    . carvedilol (COREG) 3.125 MG tablet Take 3 tablets (9.375 mg total) by mouth 2 (two) times daily. 180 tablet 4  . clopidogrel (PLAVIX) 75 MG tablet Take 75 mg by mouth daily.    . Multiple Vitamin (MULTIVITAMIN) tablet Take 1 tablet by mouth daily.    . rosuvastatin (CRESTOR) 40 MG tablet Take 40 mg by mouth daily.    . sacubitril-valsartan (ENTRESTO) 49-51 MG Take 1 tablet by mouth 2 (two) times daily. 60 tablet 1  . acetaminophen (TYLENOL) 500 MG tablet Take 500 mg by mouth every 6 (six) hours as needed. (Patient not taking: Reported on 11/25/2019)    . naproxen (NAPROSYN) 500 MG tablet Take 500 mg by mouth 2 (two) times daily with a meal. (Patient not taking: Reported on 11/25/2019)     No current facility-administered medications for this visit.    Allergies:   Patient has no known allergies.    ROS:  Please see the history of present illness.   Otherwise, review of systems are positive for none.   All other systems are reviewed and negative.  PHYSICAL EXAM: VS:  BP 122/70   Pulse 62   Ht 5\' 10"  (1.778 m)   Wt 173 lb 3.2 oz (78.6 kg)   SpO2 96%   BMI 24.85 kg/m  , BMI Body mass index is 24.85 kg/m. GENERAL:  Well appearing NECK:  No jugular venous distention, waveform within normal limits, carotid upstroke brisk and symmetric, no bruits, no thyromegaly LUNGS:  Clear to auscultation bilaterally CHEST:  Unremarkable HEART:  PMI not displaced or sustained,S1 and S2 within normal limits, no S3, no S4, no clicks, no rubs, no murmurs ABD:  Flat, positive bowel sounds normal in frequency in pitch, no bruits, no rebound, no guarding, no midline pulsatile mass, no hepatomegaly, no splenomegaly EXT:  2 plus pulses throughout, no edema, no cyanosis no clubbing   EKG:  EKG is not ordered today. NA  Recent  Labs: 09/10/2019: ALT 22; BUN 27; Creatinine, Ser 1.44; Hemoglobin 12.0; Platelets 207.0; Potassium 4.6; Sodium 138    Lipid Panel    Component Value Date/Time   CHOL 119 09/10/2019 0825   TRIG 65.0 09/10/2019 0825   HDL 44.00 09/10/2019 0825   CHOLHDL 3 09/10/2019 0825   VLDL 13.0 09/10/2019 0825   LDLCALC 62 09/10/2019 0825      Wt Readings from Last 3 Encounters:  11/25/19 173 lb 3.2 oz (78.6 kg)  10/22/19 177 lb 12.8 oz (80.6 kg)  09/10/19 180 lb (81.6 kg)      Other studies Reviewed: Additional studies/ records that were reviewed today include: None Review of the above records demonstrates:  Please see elsewhere in the note.     ASSESSMENT AND PLAN:  CAD:   I will continue DAPT indefinitely.   No change in therapy.   DYSLIPIDEMIA:   LDL was 62.  No change in therapy  CARDIOMYOPATHY:    Today we will increase his Entresto.  I would like him to come back in 1 month and see if his heart rate and blood pressure will allow increasing his carvedilol.  When he comes back he should also get a basic metabolic profile.   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  I will follow in Sept of 2022.       Current medicines are reviewed at length with the patient today.  The patient does not have concerns regarding medicines.  The following changes have been made:  As above  Labs/ tests ordered today include:   Orders Placed This Encounter  Procedures  . Basic metabolic panel     Disposition:   FU with me in APP in one month.     Signed, 02-12-1979, MD  11/25/2019 9:33 AM    [facility]

## 2019-11-25 ENCOUNTER — Encounter: Payer: Self-pay | Admitting: Cardiology

## 2019-11-25 ENCOUNTER — Other Ambulatory Visit: Payer: Self-pay

## 2019-11-25 ENCOUNTER — Ambulatory Visit (INDEPENDENT_AMBULATORY_CARE_PROVIDER_SITE_OTHER): Payer: Medicare Other | Admitting: Cardiology

## 2019-11-25 VITALS — BP 122/70 | HR 62 | Ht 70.0 in | Wt 173.2 lb

## 2019-11-25 DIAGNOSIS — E785 Hyperlipidemia, unspecified: Secondary | ICD-10-CM

## 2019-11-25 DIAGNOSIS — I255 Ischemic cardiomyopathy: Secondary | ICD-10-CM | POA: Diagnosis not present

## 2019-11-25 DIAGNOSIS — I6523 Occlusion and stenosis of bilateral carotid arteries: Secondary | ICD-10-CM | POA: Diagnosis not present

## 2019-11-25 MED ORDER — CARVEDILOL 3.125 MG PO TABS
9.3750 mg | ORAL_TABLET | Freq: Two times a day (BID) | ORAL | 4 refills | Status: DC
Start: 2019-11-25 — End: 2020-01-07

## 2019-11-25 NOTE — Patient Instructions (Addendum)
Medication Instructions:    increase dose of Carvedilol 9.375 twice a day   ( 3 tablets each time)   *If you need a refill on your cardiac medications before your next appointment, please call your pharmacy*   Lab Work: Bmp in one month  If you have labs (blood work) drawn today and your tests are completely normal, you will receive your results only by: [name] MyChart Message (if you have MyChart) OR . A paper copy in the mail If you have any lab test that is abnormal or we need to change your treatment, we will call you to review the results.   Testing/Procedures: Not needed   Follow-Up: At [facility], you and your health needs are our priority.  As part of our continuing mission to provide you with exceptional heart care, we have created designated Provider Care Teams.  These Care Teams include your primary Cardiologist (physician) and Advanced Practice Providers (APPs -  Physician Assistants and Nurse Practitioners) who all work together to provide you with the care you need, when you need it.  We recommend signing up for the patient portal called "MyChart".  Sign up information is provided on this After Visit Summary.  MyChart is used to connect with patients for Virtual Visits (Telemedicine).  Patients are able to view lab/test results, encounter notes, upcoming appointments, etc.  Non-urgent messages can be sent to your provider as well.   To learn more about what you can do with MyChart, go to [email or web address].    Your next appointment:   1 month(s)  The format for your next appointment:   In Person  Provider:   You may see  or one of the following Advanced Practice Providers on your designated Care Team:    Theodore Demark, PA-C  Joni Reining, DNP, ANP

## 2019-12-19 ENCOUNTER — Other Ambulatory Visit: Payer: Self-pay | Admitting: Cardiology

## 2020-01-06 DIAGNOSIS — I429 Cardiomyopathy, unspecified: Secondary | ICD-10-CM | POA: Insufficient documentation

## 2020-01-06 DIAGNOSIS — I6529 Occlusion and stenosis of unspecified carotid artery: Secondary | ICD-10-CM | POA: Insufficient documentation

## 2020-01-06 LAB — BASIC METABOLIC PANEL
BUN/Creatinine Ratio: 14 (ref 10–24)
BUN: 24 mg/dL (ref 8–27)
CO2: 20 mmol/L (ref 20–29)
Calcium: 9.2 mg/dL (ref 8.6–10.2)
Chloride: 108 mmol/L — ABNORMAL HIGH (ref 96–106)
Creatinine, Ser: 1.73 mg/dL — ABNORMAL HIGH (ref 0.76–1.27)
GFR calc Af Amer: 45 mL/min/{1.73_m2} — ABNORMAL LOW (ref >59)
GFR calc non Af Amer: 39 mL/min/{1.73_m2} — ABNORMAL LOW (ref >59)
Glucose: 93 mg/dL (ref 65–99)
Potassium: 5.3 mmol/L — ABNORMAL HIGH (ref 3.5–5.2)
Sodium: 142 mmol/L (ref 134–144)

## 2020-01-06 NOTE — Progress Notes (Signed)
Cardiology Office Note   Date:  01/07/2020   ID:  Caleb House, DOB 09-Jun-1950, MRN [id number]  PCP:  Lynnda Child, MD  Cardiologist:   No primary care provider on file. Referring:  Lynnda Child, MD  Chief Complaint  Patient presents with  . Dizziness      History of Present Illness: Caleb House is a 69 y.o. male who is referred by Lynnda Child, MD for evaluation of CAD.  He came for [facility]   He has had severe coronary artery disease.  He had LAD and RCA chronic total obstruction and high-grade disease in his circumflex treated with PCI on September 09, 2018.[name] He was turned down for bypass because of his severe COPD.  His ejection fraction in July 2020 was 38%.  He did have PET scanning that demonstrated moderate sized full-thickness defect in the mid to distal anterior wall and apex.  He had viable anterior and lateral wall.  He had LAD CTO revascularization.  He had ADR/Sting-Ray on October 23, 2018.   He had staged RCA CTO revascularization on January 01, 2019.  Of note he could not tolerate Brilinta secondary to shortness of breath.  His ejection fraction was 38%.  I do see labs from the fall and he had normal renal function.  I have been titrating his medications.  At the last visit I increased Entresto and beta blocker. He has been getting lightheaded and has to stand up slowly with his beta-blocker. His heart rate has been low. He is not had any presyncope or syncope. He has no palpitations. He is not having any chest pressure, neck or arm discomfort. He has no new shortness of breath, PND or orthopnea. He has no weight gain or edema.  Past Medical History:  Diagnosis Date  . CAD (coronary artery disease)   . CHF (congestive heart failure) (HCC)   . HTN (hypertension)     Past Surgical History:  Procedure Laterality Date  . CARDIAC CATHETERIZATION     7 stents placed  . KNEE SURGERY     Right knee  . TONSILLECTOMY       Current Outpatient Medications   Medication Sig Dispense Refill  . acetaminophen (TYLENOL) 500 MG tablet Take 500 mg by mouth every 6 (six) hours as needed.    [name] aspirin EC 81 MG tablet Take 81 mg by mouth daily. Swallow whole.    [name] BREO ELLIPTA 100-25 MCG/INH AEPB Inhale 1 puff into the lungs daily.    . clopidogrel (PLAVIX) 75 MG tablet Take 75 mg by mouth daily.    [name] ENTRESTO 49-51 MG TAKE 1 TABLET BY MOUTH TWICE A DAY 60 tablet 10  . Multiple Vitamin (MULTIVITAMIN) tablet Take 1 tablet by mouth daily.    . naproxen (NAPROSYN) 500 MG tablet Take 500 mg by mouth 2 (two) times daily with a meal.    . rosuvastatin (CRESTOR) 40 MG tablet Take 40 mg by mouth daily.    . carvedilol (COREG) 6.25 MG tablet Take 1 tablet (6.25 mg total) by mouth 2 (two) times daily. 90 tablet 3   No current facility-administered medications for this visit.    Allergies:   Patient has no known allergies.    ROS:  Please see the history of present illness.   Otherwise, review of systems are positive for none.   All other systems are reviewed and negative.    PHYSICAL EXAM: VS:  BP 123/74   Pulse [name])  55   Temp (!) 95.9 F (35.5 C)   Ht 5\' 10"  (1.778 m)   Wt 179 lb 6.4 oz (81.4 kg)   SpO2 95%   BMI 25.74 kg/m  , BMI Body mass index is 25.74 kg/m. GENERAL:  Well appearing NECK:  No jugular venous distention, waveform within normal limits, carotid upstroke brisk and symmetric, no bruits, no thyromegaly LUNGS:  Clear to auscultation bilaterally CHEST:  Unremarkable HEART:  PMI not displaced or sustained,S1 and S2 within normal limits, no S3, no S4, no clicks, no rubs, no murmurs ABD:  Flat, positive bowel sounds normal in frequency in pitch, no bruits, no rebound, no guarding, no midline pulsatile mass, no hepatomegaly, no splenomegaly EXT:  2 plus pulses throughout, no edema, no cyanosis no clubbing    EKG:  EKG is not ordered today.   Recent Labs: 09/10/2019: ALT 22; Hemoglobin 12.0; Platelets 207.0 01/05/2020: BUN 24;  Creatinine, Ser 1.73; Potassium 5.3; Sodium 142    Lipid Panel    Component Value Date/Time   CHOL 119 09/10/2019 0825   TRIG 65.0 09/10/2019 0825   HDL 44.00 09/10/2019 0825   CHOLHDL 3 09/10/2019 0825   VLDL 13.0 09/10/2019 0825   LDLCALC 62 09/10/2019 0825      Wt Readings from Last 3 Encounters:  01/07/20 179 lb 6.4 oz (81.4 kg)  11/25/19 173 lb 3.2 oz (78.6 kg)  10/22/19 177 lb 12.8 oz (80.6 kg)      Other studies Reviewed: Additional studies/ records that were reviewed today include: Labs Review of the above records demonstrates:  Please see elsewhere in the note.     ASSESSMENT AND PLAN:  CAD:   The patient has no new sypmtoms.  No further cardiovascular testing is indicated.  We will continue with aggressive risk reduction and meds as listed  He will be on DAPT indefinitely.    DYSLIPIDEMIA:   LDL was 62 with HDL of 44.   CARDIOMYOPATHY:      Today we will increase his Entresto.  I would like him to come back in 1 month and see if his heart rate and blood pressure will allow increasing his carvedilol.  When he comes back he should also get a basic metabolic profile. I think I have to back off on his carvedilol to 6.25 twice daily as he is dizzy and bradycardic. I am going to try to increase his Entresto to 97/103 twice daily if his creat comes back normal or only mildly increased today.    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   I will follow in Sept of 2022.     CKD:  Creat and potassuim were elevated on labs two days ago.  I will repeat a BMET this morning.  He needs to take increased oral hydration as he drinks barely any fluid.     Current medicines are reviewed at length with the patient today.  The patient does not have concerns regarding medicines.  The following changes have been made:  As above  Labs/ tests ordered today include:    Orders Placed This Encounter  Procedures  . Basic metabolic panel     Disposition:    FU with me in one month.    Signed, 02-12-1979, MD  01/07/2020 8:57 AM    [facility]

## 2020-01-07 ENCOUNTER — Other Ambulatory Visit: Payer: Self-pay

## 2020-01-07 ENCOUNTER — Ambulatory Visit (INDEPENDENT_AMBULATORY_CARE_PROVIDER_SITE_OTHER): Payer: Medicare Other | Admitting: Cardiology

## 2020-01-07 ENCOUNTER — Encounter: Payer: Self-pay | Admitting: Cardiology

## 2020-01-07 VITALS — BP 123/74 | HR 55 | Temp 95.9°F | Ht 70.0 in | Wt 179.4 lb

## 2020-01-07 DIAGNOSIS — I429 Cardiomyopathy, unspecified: Secondary | ICD-10-CM | POA: Diagnosis not present

## 2020-01-07 DIAGNOSIS — I255 Ischemic cardiomyopathy: Secondary | ICD-10-CM | POA: Diagnosis not present

## 2020-01-07 DIAGNOSIS — I6529 Occlusion and stenosis of unspecified carotid artery: Secondary | ICD-10-CM

## 2020-01-07 DIAGNOSIS — E785 Hyperlipidemia, unspecified: Secondary | ICD-10-CM

## 2020-01-07 DIAGNOSIS — Z79899 Other long term (current) drug therapy: Secondary | ICD-10-CM

## 2020-01-07 DIAGNOSIS — I25118 Atherosclerotic heart disease of native coronary artery with other forms of angina pectoris: Secondary | ICD-10-CM

## 2020-01-07 LAB — BASIC METABOLIC PANEL
BUN/Creatinine Ratio: 16 (ref 10–24)
BUN: 27 mg/dL (ref 8–27)
CO2: 21 mmol/L (ref 20–29)
Calcium: 9.2 mg/dL (ref 8.6–10.2)
Chloride: 108 mmol/L — ABNORMAL HIGH (ref 96–106)
Creatinine, Ser: 1.64 mg/dL — ABNORMAL HIGH (ref 0.76–1.27)
GFR calc Af Amer: 48 mL/min/{1.73_m2} — ABNORMAL LOW (ref >59)
GFR calc non Af Amer: 41 mL/min/{1.73_m2} — ABNORMAL LOW (ref >59)
Glucose: 104 mg/dL — ABNORMAL HIGH (ref 65–99)
Potassium: 4.6 mmol/L (ref 3.5–5.2)
Sodium: 142 mmol/L (ref 134–144)

## 2020-01-07 MED ORDER — CARVEDILOL 6.25 MG PO TABS: 6.2500 mg | ORAL_TABLET | Freq: Two times a day (BID) | ORAL | 3 refills | Status: DC

## 2020-01-07 NOTE — Patient Instructions (Signed)
Medication Instructions:  Decrease Carvedilol to 6.25mg  twice a day *If you need a refill on your cardiac medications before your next appointment, please call your pharmacy*  Lab Work: Your physician recommends that you return for lab work today (BMP)  Testing/Procedures: None ordered this visit  Follow-Up: At [facility], you and your health needs are our priority.  As part of our continuing mission to provide you with exceptional heart care, we have created designated Provider Care Teams.  These Care Teams include your primary Cardiologist (physician) and Advanced Practice Providers (APPs -  Physician Assistants and Nurse Practitioners) who all work together to provide you with the care you need, when you need it.  Your next appointment:   1 month(s)  The format for your next appointment:   In Person  Provider:   Rollene Rotunda, MD

## 2020-01-08 ENCOUNTER — Ambulatory Visit: Payer: Medicare Other | Admitting: Adult Health

## 2020-01-08 ENCOUNTER — Ambulatory Visit: Payer: Medicare Other | Admitting: Cardiology

## 2020-01-11 ENCOUNTER — Telehealth: Payer: Self-pay

## 2020-01-11 MED ORDER — ENTRESTO 97-103 MG PO TABS: 1.0000 | ORAL_TABLET | Freq: Two times a day (BID) | ORAL | 11 refills | Status: DC

## 2020-01-11 NOTE — Telephone Encounter (Signed)
-----   Message from Rollene Rotunda, MD sent at 01/08/2020 12:07 PM EST ----- Creat is better and potassium not elevated .  He can start increased Entresto.  Call Mr. Scarfo with the results and send results to Lynnda Child, MD

## 2020-01-11 NOTE — Telephone Encounter (Signed)
Spoke with patient. Informed patient of lab results and ordered new Entresto 97-103 dosage. Prescription sent to preferred pharmacy. No further questions.

## 2020-02-08 ENCOUNTER — Ambulatory Visit: Payer: Medicare Other | Admitting: Cardiology

## 2020-02-17 ENCOUNTER — Other Ambulatory Visit: Payer: Self-pay | Admitting: Cardiology

## 2020-03-16 NOTE — Progress Notes (Signed)
Cardiology Office Note   Date:  03/17/2020   ID:  Caleb House, DOB 1950-04-15, MRN [id number]  PCP:  Caleb Child, MD  Cardiologist:   No primary care provider on file. Referring:  Caleb Child, MD  Chief Complaint  Patient presents with  . Cardiomyopathy      History of Present Illness: Caleb House is a 70 y.o. male who is referred by Caleb Child, MD for evaluation of CAD.  He came for [facility]   He has had severe coronary artery disease.  He had LAD and RCA chronic total obstruction and high-grade disease in his circumflex treated with PCI on September 09, 2018.[name] He was turned down for bypass because of his severe COPD.  His ejection fraction in July 2020 was 38%.  He did have PET scanning that demonstrated moderate sized full-thickness defect in the mid to distal anterior wall and apex.  He had viable anterior and lateral wall.  He had LAD CTO revascularization.  He had ADR/Sting-Ray on October 23, 2018.   He had staged RCA CTO revascularization on January 01, 2019.  Of note he could not tolerate Brilinta secondary to shortness of breath.  His ejection fraction was 38%.    I have been titrating his medications.   At the last visit I increased his Entresto.   He did okay with this.  He has had a little bit of orthostatic dizziness but this passes quickly and he is careful not to move quickly.  He denies any new shortness of breath, PND or orthopnea.  He said no palpitations, presyncope or syncope.  He has had no chest pressure, neck or arm discomfort.   Past Medical History:  Diagnosis Date  . CAD (coronary artery disease)   . CHF (congestive heart failure) (HCC)   . HTN (hypertension)     Past Surgical History:  Procedure Laterality Date  . CARDIAC CATHETERIZATION     7 stents placed  . KNEE SURGERY     Right knee  . TONSILLECTOMY       Current Outpatient Medications  Medication Sig Dispense Refill  . acetaminophen (TYLENOL) 500 MG tablet Take 500 mg by  mouth every 6 (six) hours as needed.    [name] aspirin EC 81 MG tablet Take 81 mg by mouth daily. Swallow whole.    [name] BREO ELLIPTA 100-25 MCG/INH AEPB Inhale 1 puff into the lungs daily.    . Calcium Carbonate Antacid (ALKA-SELTZER ANTACID PO) Take by mouth.    . carvedilol (COREG) 6.25 MG tablet Take 1 tablet (6.25 mg total) by mouth 2 (two) times daily. 90 tablet 3  . clopidogrel (PLAVIX) 75 MG tablet Take 75 mg by mouth daily.    . Multiple Vitamins-Minerals (IMMUNE SUPPORT PO) Take by mouth.    . Multiple Vitamins-Minerals (PRESERVISION AREDS 2) CHEW Chew by mouth.    . naproxen (NAPROSYN) 500 MG tablet Take 500 mg by mouth 2 (two) times daily with a meal.    . rosuvastatin (CRESTOR) 40 MG tablet Take 40 mg by mouth daily.    . sacubitril-valsartan (ENTRESTO) 97-103 MG Take 1 tablet by mouth 2 (two) times daily. 60 tablet 11  . spironolactone (ALDACTONE) 25 MG tablet Take 0.5 tablets (12.5 mg total) by mouth daily. 45 tablet 3   No current facility-administered medications for this visit.    Allergies:   Brilinta [ticagrelor]    ROS:  Please see the history of present illness.   Otherwise,  review of systems are positive for none.   All other systems are reviewed and negative.    PHYSICAL EXAM: VS:  BP 124/80   Pulse (!) 48   Ht 5\' 11"  (1.803 m)   Wt 181 lb (82.1 kg)   SpO2 98%   BMI 25.24 kg/m  , BMI Body mass index is 25.24 kg/m. GENERAL:  Well appearing NECK:  No jugular venous distention, waveform within normal limits, carotid upstroke brisk and symmetric, no bruits, no thyromegaly LUNGS:  Clear to auscultation bilaterally CHEST:  Unremarkable HEART:  PMI not displaced or sustained,S1 and S2 within normal limits, no S3, no S4, no clicks, no rubs, no murmurs ABD:  Flat, positive bowel sounds normal in frequency in pitch, no bruits, no rebound, no guarding, no midline pulsatile mass, no hepatomegaly, no splenomegaly EXT:  2 plus pulses throughout, no edema, no cyanosis no  clubbing   EKG:  EKG is  ordered today. Sinus bradycardia, rate 48, low voltage in the limb and chest leads, old anteroseptal infarct, lateral T wave inversions unchanged from previous.  Recent Labs: 09/10/2019: ALT 22; Hemoglobin 12.0; Platelets 207.0 01/07/2020: BUN 27; Creatinine, Ser 1.64; Potassium 4.6; Sodium 142    Lipid Panel    Component Value Date/Time   CHOL 119 09/10/2019 0825   TRIG 65.0 09/10/2019 0825   HDL 44.00 09/10/2019 0825   CHOLHDL 3 09/10/2019 0825   VLDL 13.0 09/10/2019 0825   LDLCALC 62 09/10/2019 0825      Wt Readings from Last 3 Encounters:  03/17/20 181 lb (82.1 kg)  01/07/20 179 lb 6.4 oz (81.4 kg)  11/25/19 173 lb 3.2 oz (78.6 kg)      Other studies Reviewed: Additional studies/ records that were reviewed today include: Labs Review of the above records demonstrates:  Please see elsewhere in the note.     ASSESSMENT AND PLAN:  CAD:     The patient has no new sypmtoms.  No further cardiovascular testing is indicated.  We will continue with aggressive risk reduction and meds as listed.  He will be on DAPT indefinitely given the extent of his disease.  DYSLIPIDEMIA:   LDL was 62 no change in therapy.   CARDIOMYOPATHY:    We had a long discussion about goal-directed medical therapy.  He does have some mild renal insufficiency.  However, I think is very reasonable to try spironolactone.    I will follow guidelines for spironolactone follow up in heart failure.  (Check potassium levels and  renal function 3--4 days and 1 week, then at least monthly for first 3 months and every 3 months thereafter after initiation of spironolactone.)  We talked about this at length.  If he gets dizzy or has any progressive renal insufficiency we will need to back off.  We talked briefly about SGLT2 inhibitors and he thinks that would probably be cost prohibitive.  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  I will follow this up in September  2022.   CKD:  Creat and potassium were stable in December.  Again this will be followed very closely as above.   Current medicines are reviewed at length with the patient today.  The patient does not have concerns regarding medicines.  The following changes have been made:  As above  Labs/ tests ordered today include:    Orders Placed This Encounter  Procedures  . Basic metabolic panel  . Basic metabolic panel  . Basic  metabolic panel  . EKG 12-Lead     Disposition:   FU with me in 6 weeks   Signed, Rollene Rotunda, MD  03/17/2020 10:43 AM    [facility]

## 2020-03-17 ENCOUNTER — Other Ambulatory Visit: Payer: Self-pay

## 2020-03-17 ENCOUNTER — Encounter: Payer: Self-pay | Admitting: Cardiology

## 2020-03-17 ENCOUNTER — Ambulatory Visit (INDEPENDENT_AMBULATORY_CARE_PROVIDER_SITE_OTHER): Payer: Medicare Other | Admitting: Cardiology

## 2020-03-17 VITALS — BP 124/80 | HR 48 | Ht 71.0 in | Wt 181.0 lb

## 2020-03-17 DIAGNOSIS — I10 Essential (primary) hypertension: Secondary | ICD-10-CM

## 2020-03-17 DIAGNOSIS — I509 Heart failure, unspecified: Secondary | ICD-10-CM | POA: Diagnosis not present

## 2020-03-17 DIAGNOSIS — Z79899 Other long term (current) drug therapy: Secondary | ICD-10-CM | POA: Diagnosis not present

## 2020-03-17 DIAGNOSIS — I25118 Atherosclerotic heart disease of native coronary artery with other forms of angina pectoris: Secondary | ICD-10-CM

## 2020-03-17 MED ORDER — SPIRONOLACTONE 25 MG PO TABS: 12.5000 mg | ORAL_TABLET | Freq: Every day | ORAL | 3 refills | Status: DC

## 2020-03-17 NOTE — Patient Instructions (Signed)
Medication Instructions:  Start 12.5mg  of Spironolactone daily *If you need a refill on your cardiac medications before your next appointment, please call your pharmacy*  Lab Work: Your physician recommends that you return for lab work: 4 days after starting spironolactone - 2/28 1 week after having lab work - 3/7 1 month after previous lab work - 4/7  If you have labs (blood work) drawn today and your tests are completely normal, you will receive your results only by: [name] MyChart Message (if you have MyChart) OR . A paper copy in the mail If you have any lab test that is abnormal or we need to change your treatment, we will call you to review the results.  Follow-Up: At [facility], you and your health needs are our priority.  As part of our continuing mission to provide you with exceptional heart care, we have created designated Provider Care Teams.  These Care Teams include your primary Cardiologist (physician) and Advanced Practice Providers (APPs -  Physician Assistants and Nurse Practitioners) who all work together to provide you with the care you need, when you need it.   Your next appointment:   6 week(s)  The format for your next appointment:   In Person  Provider:   Rollene Rotunda, MD

## 2020-03-23 LAB — BASIC METABOLIC PANEL
BUN/Creatinine Ratio: 16 (ref 10–24)
BUN: 28 mg/dL — ABNORMAL HIGH (ref 8–27)
CO2: 20 mmol/L (ref 20–29)
Calcium: 9.4 mg/dL (ref 8.6–10.2)
Chloride: 105 mmol/L (ref 96–106)
Creatinine, Ser: 1.7 mg/dL — ABNORMAL HIGH (ref 0.76–1.27)
Glucose: 99 mg/dL (ref 65–99)
Potassium: 5 mmol/L (ref 3.5–5.2)
Sodium: 140 mmol/L (ref 134–144)
eGFR: 43 mL/min/{1.73_m2} — ABNORMAL LOW (ref >59)

## 2020-03-30 LAB — BASIC METABOLIC PANEL
BUN/Creatinine Ratio: 17 (ref 10–24)
BUN: 33 mg/dL — ABNORMAL HIGH (ref 8–27)
CO2: 17 mmol/L — ABNORMAL LOW (ref 20–29)
Calcium: 8.6 mg/dL (ref 8.6–10.2)
Chloride: 108 mmol/L — ABNORMAL HIGH (ref 96–106)
Creatinine, Ser: 1.96 mg/dL — ABNORMAL HIGH (ref 0.76–1.27)
Glucose: 114 mg/dL — ABNORMAL HIGH (ref 65–99)
Potassium: 4.7 mmol/L (ref 3.5–5.2)
Sodium: 139 mmol/L (ref 134–144)
eGFR: 36 mL/min/{1.73_m2} — ABNORMAL LOW (ref >59)

## 2020-04-16 ENCOUNTER — Telehealth: Payer: Self-pay | Admitting: Cardiology

## 2020-04-16 ENCOUNTER — Telehealth: Payer: Self-pay | Admitting: Physician Assistant

## 2020-04-16 LAB — BASIC METABOLIC PANEL
BUN/Creatinine Ratio: 19 (ref 10–24)
BUN: 39 mg/dL — ABNORMAL HIGH (ref 8–27)
CO2: 17 mmol/L — ABNORMAL LOW (ref 20–29)
Calcium: 9.6 mg/dL (ref 8.6–10.2)
Chloride: 107 mmol/L — ABNORMAL HIGH (ref 96–106)
Creatinine, Ser: 2.07 mg/dL — ABNORMAL HIGH (ref 0.76–1.27)
Glucose: 89 mg/dL (ref 65–99)
Potassium: 6.1 mmol/L (ref 3.5–5.2)
Sodium: 139 mmol/L (ref 134–144)
eGFR: 34 mL/min/{1.73_m2} — ABNORMAL LOW (ref >59)

## 2020-04-16 MED ORDER — LOKELMA 10 G PO PACK: 10.0000 g | PACK | Freq: Once | ORAL | 0 refills | Status: AC

## 2020-04-16 NOTE — Telephone Encounter (Signed)
Called patient and updated with Dr. Jenene Slicker recommendation.  Patient already took his spironolactone this morning.  He will stop starting tomorrow.   Wrote spironolactone as allergy. Sent lokelma prescription to requested pharmacy. Advised to avoid leafy green vegetables until reevaluation of abnormal labs. He will call office Monday morning to arrange blood work.

## 2020-04-16 NOTE — Telephone Encounter (Signed)
Received page from [facility] regarding K+ 6.1. Notes reviewed from earlier in the morning. Caleb House addressed hyperkalemia with the patient per instructions noted by Dr. Antoine Poche.

## 2020-04-17 ENCOUNTER — Other Ambulatory Visit: Payer: Self-pay | Admitting: *Deleted

## 2020-04-17 DIAGNOSIS — E875 Hyperkalemia: Secondary | ICD-10-CM

## 2020-04-18 ENCOUNTER — Other Ambulatory Visit: Payer: Medicare Other

## 2020-04-18 LAB — BASIC METABOLIC PANEL
BUN/Creatinine Ratio: 17 (ref 10–24)
BUN: 34 mg/dL — ABNORMAL HIGH (ref 8–27)
CO2: 18 mmol/L — ABNORMAL LOW (ref 20–29)
Calcium: 9.1 mg/dL (ref 8.6–10.2)
Chloride: 107 mmol/L — ABNORMAL HIGH (ref 96–106)
Creatinine, Ser: 2 mg/dL — ABNORMAL HIGH (ref 0.76–1.27)
Glucose: 111 mg/dL — ABNORMAL HIGH (ref 65–99)
Potassium: 5.1 mmol/L (ref 3.5–5.2)
Sodium: 139 mmol/L (ref 134–144)
eGFR: 35 mL/min/{1.73_m2} — ABNORMAL LOW (ref >59)

## 2020-05-16 NOTE — Progress Notes (Signed)
Cardiology Office Note   Date:  05/17/2020   ID:  Caleb House, DOB 1951/01/03, MRN [id number]  PCP:  Lynnda Child, MD  Cardiologist:   No primary care provider on file. Referring:  Lynnda Child, MD  Chief Complaint  Patient presents with  . Follow-up  . Coronary Artery Disease      History of Present Illness: Caleb House is a 70 y.o. male who is referred by Lynnda Child, MD for evaluation of CAD.  He came for [facility].   He has had severe coronary artery disease.  He had LAD and RCA chronic total obstruction and high-grade disease in his circumflex treated with PCI on September 09, 2018.[name] He was turned down for bypass because of his severe COPD.  His ejection fraction in July 2020 was 38%.  He did have PET scanning that demonstrated moderate sized full-thickness defect in the mid to distal anterior wall and apex.  He had viable anterior and lateral wall.  He had LAD CTO revascularization.  He had ADR/Sting-Ray on October 23, 2018.   He had staged RCA CTO revascularization on January 01, 2019.  Of note he could not tolerate Brilinta secondary to shortness of breath.  His ejection fraction was 38%.   At the last visit I tried to start spironolactone but he had hyperkalemia.    Since I last saw him he has felt well.  He denies any new cardiovascular symptoms.  He is not having any new palpitations, presyncope or syncope.  He said he had side pain when he was taking the spironolactone.  Past Medical History:  Diagnosis Date  . CAD (coronary artery disease)   . CHF (congestive heart failure) (HCC)   . HTN (hypertension)     Past Surgical History:  Procedure Laterality Date  . CARDIAC CATHETERIZATION     7 stents placed  . KNEE SURGERY     Right knee  . TONSILLECTOMY       Current Outpatient Medications  Medication Sig Dispense Refill  . acetaminophen (TYLENOL) 500 MG tablet Take 500 mg by mouth every 6 (six) hours as needed.    [name] aspirin EC 81 MG tablet Take 81 mg  by mouth daily. Swallow whole.    [name] BREO ELLIPTA 100-25 MCG/INH AEPB Inhale 1 puff into the lungs daily.    . Calcium Carbonate Antacid (ALKA-SELTZER ANTACID PO) Take by mouth.    . carvedilol (COREG) 6.25 MG tablet Take 1 tablet (6.25 mg total) by mouth 2 (two) times daily. 90 tablet 3  . clopidogrel (PLAVIX) 75 MG tablet Take 75 mg by mouth daily.    . Multiple Vitamins-Minerals (PRESERVISION AREDS 2) CHEW Chew by mouth.    . rosuvastatin (CRESTOR) 40 MG tablet Take 40 mg by mouth daily.    . sacubitril-valsartan (ENTRESTO) 97-103 MG Take 1 tablet by mouth 2 (two) times daily. 60 tablet 11  . Multiple Vitamins-Minerals (IMMUNE SUPPORT PO) Take by mouth.     No current facility-administered medications for this visit.    Allergies:   Brilinta [ticagrelor] and Spironolactone    ROS:  Please see the history of present illness.   Otherwise, review of systems are positive for none.   All other systems are reviewed and negative.    PHYSICAL EXAM: VS:  BP 120/68   Pulse (!) 58   Ht 5\' 10"  (1.778 m)   Wt 177 lb 6.4 oz (80.5 kg)   SpO2 97%   BMI 25.45  kg/m  , BMI Body mass index is 25.45 kg/m. GENERAL:  Well appearing NECK:  No jugular venous distention, waveform within normal limits, carotid upstroke brisk and symmetric, no bruits, no thyromegaly LUNGS:  Clear to auscultation bilaterally CHEST:  Unremarkable HEART:  PMI not displaced or sustained,S1 and S2 within normal limits, no S3, no S4, no clicks, no rubs, no murmurs ABD:  Flat, positive bowel sounds normal in frequency in pitch, no bruits, no rebound, no guarding, no midline pulsatile mass, no hepatomegaly, no splenomegaly EXT:  2 plus pulses throughout, no edema, no cyanosis no clubbing   EKG:  EKG is not ordered today.   Recent Labs: 09/10/2019: ALT 22; Hemoglobin 12.0; Platelets 207.0 04/18/2020: BUN 34; Creatinine, Ser 2.00; Potassium 5.1; Sodium 139    Lipid Panel    Component Value Date/Time   CHOL 119 09/10/2019  0825   TRIG 65.0 09/10/2019 0825   HDL 44.00 09/10/2019 0825   CHOLHDL 3 09/10/2019 0825   VLDL 13.0 09/10/2019 0825   LDLCALC 62 09/10/2019 0825      Wt Readings from Last 3 Encounters:  05/17/20 177 lb 6.4 oz (80.5 kg)  03/17/20 181 lb (82.1 kg)  01/07/20 179 lb 6.4 oz (81.4 kg)      Other studies Reviewed: Additional studies/ records that were reviewed today include: Labs Review of the above records demonstrates:  Please see elsewhere in the note.     ASSESSMENT AND PLAN:  CAD:    He has no new anginal symptoms.  No change in therapy.   DYSLIPIDEMIA:   LDL was 62.  He will continue the meds as listed.   CARDIOMYOPATHY:      He has not tolerated spironolactone because of hyperkalemia.  He does not want to consider SGLT2 inhibitor because of cost.  He really does not want to titrate meds further and I think he is on maximal therapy that he would tolerate.  I am going to check an echocardiogram.  He would consider an ICD if his EF is less than 35%.   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  I will follow this up in September 2022.    CKD:   I will check a renal function panel today.   Current medicines are reviewed at length with the patient today.  The patient does not have concerns regarding medicines.  The following changes have been made:  None  Labs/ tests ordered today include:    Orders Placed This Encounter  Procedures  . Basic metabolic panel  . ECHOCARDIOGRAM COMPLETE     Disposition:   FU with me in 3 months.    Signed, Rollene Rotunda, MD  05/17/2020 3:45 PM    [facility]

## 2020-05-17 ENCOUNTER — Ambulatory Visit (INDEPENDENT_AMBULATORY_CARE_PROVIDER_SITE_OTHER): Payer: Medicare Other | Admitting: Cardiology

## 2020-05-17 ENCOUNTER — Encounter: Payer: Self-pay | Admitting: Cardiology

## 2020-05-17 ENCOUNTER — Other Ambulatory Visit: Payer: Self-pay

## 2020-05-17 VITALS — BP 120/68 | HR 58 | Ht 70.0 in | Wt 177.4 lb

## 2020-05-17 DIAGNOSIS — E785 Hyperlipidemia, unspecified: Secondary | ICD-10-CM | POA: Diagnosis not present

## 2020-05-17 DIAGNOSIS — I255 Ischemic cardiomyopathy: Secondary | ICD-10-CM | POA: Diagnosis not present

## 2020-05-17 DIAGNOSIS — I25118 Atherosclerotic heart disease of native coronary artery with other forms of angina pectoris: Secondary | ICD-10-CM | POA: Diagnosis not present

## 2020-05-17 DIAGNOSIS — I6523 Occlusion and stenosis of bilateral carotid arteries: Secondary | ICD-10-CM | POA: Diagnosis not present

## 2020-05-17 NOTE — Patient Instructions (Addendum)
Medication Instructions:  Your physician recommends that you continue on your current medications as directed. Please refer to the Current Medication list given to you today.  *If you need a refill on your cardiac medications before your next appointment, please call your pharmacy*  Lab Work: Your physician recommends that you return for lab work TODAY:   BMET If you have labs (blood work) drawn today and your tests are completely normal, you will receive your results only by: [name] MyChart Message (if you have MyChart) OR . A paper copy in the mail If you have any lab test that is abnormal or we need to change your treatment, we will call you to review the results.  Testing/Procedures: Your physician has requested that you have an echocardiogram. Echocardiography is a painless test that uses sound waves to create images of your heart. It provides your doctor with information about the size and shape of your heart and how well your heart's chambers and valves are working. This procedure takes approximately one hour. There are no restrictions for this procedure. This test is performed at [address], [place], [place] [address]   Please schedule for 1-2 months    Follow-Up: At [facility], you and your health needs are our priority.  As part of our continuing mission to provide you with exceptional heart care, we have created designated Provider Care Teams.  These Care Teams include your primary Cardiologist (physician) and Advanced Practice Providers (APPs -  Physician Assistants and Nurse Practitioners) who all work together to provide you with the care you need, when you need it.  Your next appointment:   3 month(s)  The format for your next appointment:   In Person  Provider:   Rollene Rotunda, MD  Other Instructions

## 2020-05-18 LAB — BASIC METABOLIC PANEL
BUN/Creatinine Ratio: 18 (ref 10–24)
BUN: 32 mg/dL — ABNORMAL HIGH (ref 8–27)
CO2: 19 mmol/L — ABNORMAL LOW (ref 20–29)
Calcium: 9 mg/dL (ref 8.6–10.2)
Chloride: 105 mmol/L (ref 96–106)
Creatinine, Ser: 1.78 mg/dL — ABNORMAL HIGH (ref 0.76–1.27)
Glucose: 96 mg/dL (ref 65–99)
Potassium: 4.6 mmol/L (ref 3.5–5.2)
Sodium: 139 mmol/L (ref 134–144)
eGFR: 40 mL/min/{1.73_m2} — ABNORMAL LOW (ref >59)

## 2020-06-15 ENCOUNTER — Ambulatory Visit (HOSPITAL_COMMUNITY): Payer: Medicare Other | Attending: Cardiology

## 2020-06-15 ENCOUNTER — Other Ambulatory Visit: Payer: Self-pay

## 2020-06-15 DIAGNOSIS — I255 Ischemic cardiomyopathy: Secondary | ICD-10-CM | POA: Diagnosis present

## 2020-06-15 LAB — ECHOCARDIOGRAM COMPLETE
Area-P 1/2: 3.45 cm2
P 1/2 time: 722 msec
S' Lateral: 2.7 cm

## 2020-07-06 MED ORDER — CLOPIDOGREL BISULFATE 75 MG PO TABS: 75.0000 mg | ORAL_TABLET | Freq: Every day | ORAL | 3 refills | Status: DC

## 2020-07-27 NOTE — Progress Notes (Signed)
Cardiology Office Note   Date:  07/28/2020   ID:  Caleb House, DOB [birth year], MRN [id number]  PCP:  Lynnda Child, MD  Cardiologist:   None Referring:  Lynnda Child, MD  Chief Complaint  Patient presents with   Coronary Artery Disease       History of Present Illness: Caleb House is a 70 y.o. male who is referred by Lynnda Child, MD for evaluation of CAD.  He came for [facility].   He has had severe coronary artery disease.  He had LAD and RCA chronic total obstruction and high-grade disease in his circumflex treated with PCI on September 09, 2018. He was turned down for bypass because of his severe COPD.  His ejection fraction in July 2020 was 38%.  He did have PET scanning that demonstrated moderate sized full-thickness defect in the mid to distal anterior wall and apex.  He had viable anterior and lateral wall.  He had LAD CTO revascularization.  He had ADR/Sting-Ray on October 23, 2018.   He had staged RCA CTO revascularization on January 01, 2019.  Of note he could not tolerate Brilinta secondary to shortness of breath.  His ejection fraction was 38%.   At the last visit I tried to start spironolactone but he had hyperkalemia.  His EF was 40 - 45% when I saw him recently.     Since I last saw him he has done well.  He does get dizzy with movements.  This is typically with bending over or sometimes just with turning quickly.  He had a little dizziness when he sat up from the exam table.  He has not had any syncope.  He is walking multiple times a day and staying very active.  He denies any chest pressure, neck or arm discomfort.  He has no new shortness of breath, PND or orthopnea.  He has had no weight gain or edema.   Past Medical History:  Diagnosis Date   CAD (coronary artery disease)    CHF (congestive heart failure) (HCC)    HTN (hypertension)     Past Surgical History:  Procedure Laterality Date   CARDIAC CATHETERIZATION     7 stents placed   KNEE SURGERY      Right knee   TONSILLECTOMY       Current Outpatient Medications  Medication Sig Dispense Refill   acetaminophen (TYLENOL) 500 MG tablet Take 500 mg by mouth every 6 (six) hours as needed.     aspirin EC 81 MG tablet Take 81 mg by mouth daily. Swallow whole.     BREO ELLIPTA 100-25 MCG/INH AEPB Inhale 1 puff into the lungs daily.     Calcium Carbonate Antacid (ALKA-SELTZER ANTACID PO) Take by mouth.     carvedilol (COREG) 6.25 MG tablet Take 1 tablet (6.25 mg total) by mouth 2 (two) times daily. 90 tablet 3   clopidogrel (PLAVIX) 75 MG tablet Take 1 tablet (75 mg total) by mouth daily. 90 tablet 3   Multiple Vitamins-Minerals (IMMUNE SUPPORT PO) Take by mouth.     Multiple Vitamins-Minerals (PRESERVISION AREDS 2) CHEW Chew by mouth.     rosuvastatin (CRESTOR) 40 MG tablet Take 40 mg by mouth daily.     sacubitril-valsartan (ENTRESTO) 97-103 MG Take 1 tablet by mouth 2 (two) times daily. 60 tablet 11   tadalafil (CIALIS) 5 MG tablet Take 1 tablet (5 mg total) by mouth daily as needed for erectile dysfunction. 10 tablet 1  No current facility-administered medications for this visit.    Allergies:   Brilinta [ticagrelor] and Spironolactone    ROS:  Please see the history of present illness.   Otherwise, review of systems are positive for ED.   All other systems are reviewed and negative.    PHYSICAL EXAM: VS:  BP 138/74   Pulse (!) 50   Ht 5\' 10"  (1.778 m)   Wt 173 lb 6.4 oz (78.7 kg)   SpO2 96%   BMI 24.88 kg/m  , BMI Body mass index is 24.88 kg/m. GENERAL:  Well appearing NECK:  No jugular venous distention, waveform within normal limits, carotid upstroke brisk and symmetric, no bruits, no thyromegaly LUNGS:  Clear to auscultation bilaterally CHEST:  Unremarkable HEART:  PMI not displaced or sustained,S1 and S2 within normal limits, no S3, no S4, no clicks, no rubs, no murmurs ABD:  Flat, positive bowel sounds normal in frequency in pitch, no bruits, no rebound, no guarding,  no midline pulsatile mass, no hepatomegaly, no splenomegaly EXT:  2 plus pulses throughout, no edema, no cyanosis no clubbing   EKG:  EKG is  not ordered today.   Recent Labs: 09/10/2019: ALT 22; Hemoglobin 12.0; Platelets 207.0 05/17/2020: BUN 32; Creatinine, Ser 1.78; Potassium 4.6; Sodium 139    Lipid Panel    Component Value Date/Time   CHOL 119 09/10/2019 0825   TRIG 65.0 09/10/2019 0825   HDL 44.00 09/10/2019 0825   CHOLHDL 3 09/10/2019 0825   VLDL 13.0 09/10/2019 0825   LDLCALC 62 09/10/2019 0825      Wt Readings from Last 3 Encounters:  07/28/20 173 lb 6.4 oz (78.7 kg)  05/17/20 177 lb 6.4 oz (80.5 kg)  03/17/20 181 lb (82.1 kg)      Other studies Reviewed: Additional studies/ records that were reviewed today include: None Review of the above records demonstrates:  Please see elsewhere in the note.     ASSESSMENT AND PLAN:  CAD:  The patient has no new sypmtoms.  No further cardiovascular testing is indicated.  We will continue with aggressive risk reduction and meds as listed.  DYSLIPIDEMIA:   LDL was 62.  No change in therapy  CARDIOMYOPATHY:    Ejection fraction improved.  He wondered about going down on the [place] because of the dizziness but we talked about it and he agrees to stay on the current dose.  He does say his blood pressure is oftentimes in the high 90s systolic.  If he has persistent dizziness we might have to reduce back down and he will let me know.    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   CKD:   He is going to have follow-up soon by his primary provider keeping an eye on his.   ED:   I will give him Cialis 5 mg and we talked about monitoring his blood pressure and if he gets dizzy or he will not be able to take this.    Current medicines are reviewed at length with the patient today.  The patient does not have concerns regarding medicines.  The following changes have been made: As above  Labs/ tests  ordered today include:  None  No orders of the defined types were placed in this encounter.    Disposition:   FU with me in 6 months.    Signed, [place], MD  07/28/2020 11:09 AM    [facility]

## 2020-07-28 ENCOUNTER — Other Ambulatory Visit: Payer: Self-pay

## 2020-07-28 ENCOUNTER — Encounter: Payer: Self-pay | Admitting: Cardiology

## 2020-07-28 ENCOUNTER — Ambulatory Visit (INDEPENDENT_AMBULATORY_CARE_PROVIDER_SITE_OTHER): Payer: Medicare Other | Admitting: Cardiology

## 2020-07-28 VITALS — BP 138/74 | HR 50 | Ht 70.0 in | Wt 173.4 lb

## 2020-07-28 DIAGNOSIS — N529 Male erectile dysfunction, unspecified: Secondary | ICD-10-CM | POA: Diagnosis not present

## 2020-07-28 DIAGNOSIS — I25118 Atherosclerotic heart disease of native coronary artery with other forms of angina pectoris: Secondary | ICD-10-CM | POA: Diagnosis not present

## 2020-07-28 DIAGNOSIS — I255 Ischemic cardiomyopathy: Secondary | ICD-10-CM | POA: Diagnosis not present

## 2020-07-28 MED ORDER — TADALAFIL 5 MG PO TABS: 5.0000 mg | ORAL_TABLET | Freq: Every day | ORAL | 1 refills | Status: DC | PRN

## 2020-07-28 NOTE — Patient Instructions (Addendum)
Medication Instructions:  RX FOR CIALIS 5 MG AS NEEDED HAS BEEN SENT TO PHARMACY   *If you need a refill on your cardiac medications before your next appointment, please call your pharmacy*  Lab Work: NONE  Testing/Procedures: Your physician has requested that you have a carotid duplex. This test is an ultrasound of the carotid arteries in your neck. It looks at blood flow through these arteries that supply the brain with blood. Allow one hour for this exam. There are no restrictions or special instructions. IN September   Follow-Up: At [facility], you and your health needs are our priority.  As part of our continuing mission to provide you with exceptional heart care, we have created designated Provider Care Teams.  These Care Teams include your primary Cardiologist (physician) and Advanced Practice Providers (APPs -  Physician Assistants and Nurse Practitioners) who all work together to provide you with the care you need, when you need it.  We recommend signing up for the patient portal called "MyChart".  Sign up information is provided on this After Visit Summary.  MyChart is used to connect with patients for Virtual Visits (Telemedicine).  Patients are able to view lab/test results, encounter notes, upcoming appointments, etc.  Non-urgent messages can be sent to your provider as well.   To learn more about what you can do with MyChart, go to [email or web address].    Your next appointment:   6 month(s)  The format for your next appointment:   In Person  Provider:   You may see DR [facility]  or one of the following Advanced Practice Providers on your designated Care Team:   Theodore Demark, PA-C Joni Reining, DNP, ANP

## 2020-07-31 ENCOUNTER — Other Ambulatory Visit: Payer: Self-pay | Admitting: Cardiology

## 2020-08-26 ENCOUNTER — Other Ambulatory Visit: Payer: Self-pay

## 2020-08-26 MED ORDER — TADALAFIL 10 MG PO TABS: 10.0000 mg | ORAL_TABLET | Freq: Every day | ORAL | 1 refills | Status: DC | PRN

## 2020-09-14 ENCOUNTER — Encounter: Payer: Self-pay | Admitting: Family Medicine

## 2020-09-14 ENCOUNTER — Ambulatory Visit (INDEPENDENT_AMBULATORY_CARE_PROVIDER_SITE_OTHER): Payer: Medicare Other | Admitting: Family Medicine

## 2020-09-14 ENCOUNTER — Other Ambulatory Visit: Payer: Self-pay

## 2020-09-14 VITALS — BP 120/68 | HR 67 | Temp 98.0°F | Ht 70.0 in | Wt 166.8 lb

## 2020-09-14 DIAGNOSIS — I10 Essential (primary) hypertension: Secondary | ICD-10-CM

## 2020-09-14 DIAGNOSIS — Z0000 Encounter for general adult medical examination without abnormal findings: Secondary | ICD-10-CM | POA: Diagnosis not present

## 2020-09-14 DIAGNOSIS — Z87891 Personal history of nicotine dependence: Secondary | ICD-10-CM | POA: Diagnosis not present

## 2020-09-14 DIAGNOSIS — E782 Mixed hyperlipidemia: Secondary | ICD-10-CM

## 2020-09-14 DIAGNOSIS — Z125 Encounter for screening for malignant neoplasm of prostate: Secondary | ICD-10-CM | POA: Diagnosis not present

## 2020-09-14 DIAGNOSIS — Z1211 Encounter for screening for malignant neoplasm of colon: Secondary | ICD-10-CM | POA: Diagnosis not present

## 2020-09-14 DIAGNOSIS — Z23 Encounter for immunization: Secondary | ICD-10-CM | POA: Diagnosis not present

## 2020-09-14 LAB — LIPID PANEL
Cholesterol: 104 mg/dL (ref 0–200)
HDL: 31.6 mg/dL — ABNORMAL LOW (ref >39.00)
LDL Cholesterol: 56 mg/dL (ref 0–99)
NonHDL: 72.54
Total CHOL/HDL Ratio: 3
Triglycerides: 83 mg/dL (ref 0.0–149.0)
VLDL: 16.6 mg/dL (ref 0.0–40.0)

## 2020-09-14 LAB — COMPREHENSIVE METABOLIC PANEL
ALT: 13 U/L (ref 0–53)
AST: 17 U/L (ref 0–37)
Albumin: 3.9 g/dL (ref 3.5–5.2)
Alkaline Phosphatase: 84 U/L (ref 39–117)
BUN: 36 mg/dL — ABNORMAL HIGH (ref 6–23)
CO2: 20 mEq/L (ref 19–32)
Calcium: 9.2 mg/dL (ref 8.4–10.5)
Chloride: 108 mEq/L (ref 96–112)
Creatinine, Ser: 1.97 mg/dL — ABNORMAL HIGH (ref 0.40–1.50)
GFR: 33.46 mL/min — ABNORMAL LOW (ref >60.00)
Glucose, Bld: 98 mg/dL (ref 70–99)
Potassium: 4.8 mEq/L (ref 3.5–5.1)
Sodium: 138 mEq/L (ref 135–145)
Total Bilirubin: 0.5 mg/dL (ref 0.2–1.2)
Total Protein: 6 g/dL (ref 6.0–8.3)

## 2020-09-14 LAB — PSA, MEDICARE: PSA: 5.67 ng/ml — ABNORMAL HIGH (ref 0.10–4.00)

## 2020-09-14 NOTE — Patient Instructions (Addendum)
Check with pharmacist about the Tdap and Shingrix Vaccine   Referral to GI and Lung Cancer screening group  #Referral I have placed a referral to a specialist for you. You should receive a phone call from the specialty office. Make sure your voicemail is not full and that if you are able to answer your phone to unknown or new numbers.   It may take up to 2 weeks to hear about the referral. If you do not hear anything in 2 weeks, please call our office and ask to speak with the referral coordinator.   GI - may take 4-6 weeks

## 2020-09-14 NOTE — Progress Notes (Signed)
Subjective:   Caleb House is a 70 y.o. male who presents for Medicare Annual/Subsequent preventive examination.  Review of Systems    Review of Systems  Constitutional:  Negative for chills and fever.  HENT:  Negative for congestion and sore throat.   Eyes:  Negative for blurred vision and double vision.  Respiratory:  Negative for shortness of breath.   Cardiovascular:  Negative for chest pain.  Gastrointestinal:  Negative for heartburn, nausea and vomiting.  Genitourinary: Negative.   Musculoskeletal: Negative.  Negative for myalgias.  Skin:  Negative for rash.  Neurological:  Negative for dizziness and headaches.  Endo/Heme/Allergies:  Does not bruise/bleed easily.  Psychiatric/Behavioral:  Negative for depression. The patient is not nervous/anxious.    Cardiac Risk Factors include: advanced age (>34men, >39 women);dyslipidemia;male gender;hypertension;smoking/ tobacco exposure     Objective:    Today's Vitals   09/14/20 0821  BP: 120/68  Pulse: 67  Temp: 98 F (36.7 C)  TempSrc: Temporal  SpO2: 98%  Weight: 166 lb 12 oz (75.6 kg)  Height: 5\' 10"  (1.778 m)   Body mass index is 23.93 kg/m.  Advanced Directives 09/14/2020  Does Patient Have a Medical Advance Directive? Yes  Type of 09/16/2020 of [place];Living will  Does patient want to make changes to medical advance directive? No - Patient declined  Copy of Healthcare Power of Attorney in Chart? No - copy requested    Current Medications (verified) Outpatient Encounter Medications as of 09/14/2020  Medication Sig   acetaminophen (TYLENOL) 500 MG tablet Take 500 mg by mouth every 6 (six) hours as needed.   aspirin EC 81 MG tablet Take 81 mg by mouth daily. Swallow whole.   Calcium Carbonate Antacid (ALKA-SELTZER ANTACID PO) Take by mouth.   carvedilol (COREG) 6.25 MG tablet TAKE 1 TABLET BY MOUTH TWICE A DAY   clopidogrel (PLAVIX) 75 MG tablet Take 1 tablet (75 mg total) by mouth  daily.   Multiple Vitamins-Minerals (IMMUNE SUPPORT PO) Take by mouth.   Multiple Vitamins-Minerals (PRESERVISION AREDS 2) CHEW Chew by mouth.   rosuvastatin (CRESTOR) 40 MG tablet Take 40 mg by mouth daily.   sacubitril-valsartan (ENTRESTO) 97-103 MG Take 1 tablet by mouth 2 (two) times daily.   tadalafil (CIALIS) 10 MG tablet Take 1 tablet (10 mg total) by mouth daily as needed for erectile dysfunction.   [DISCONTINUED] BREO ELLIPTA 100-25 MCG/INH AEPB Inhale 1 puff into the lungs daily.   No facility-administered encounter medications on file as of 09/14/2020.    Allergies (verified) Brilinta [ticagrelor] and Spironolactone   History: Past Medical History:  Diagnosis Date   CAD (coronary artery disease)    CHF (congestive heart failure) (HCC)    HTN (hypertension)    Past Surgical History:  Procedure Laterality Date   CARDIAC CATHETERIZATION     7 stents placed   KNEE SURGERY     Right knee   TONSILLECTOMY     Family History  Problem Relation Age of Onset   Depression Mother    Alcohol abuse Mother    Suicidality Mother    Heart disease Father        No details   Heart disease Brother        No details   Suicidality Maternal Grandfather    Heart attack Paternal Grandfather 74   Social History   Socioeconomic History   Marital status: Divorced    Spouse name: Not on file   Number of children: 1  Years of education: college   Highest education level: Not on file  Occupational History   Not on file  Tobacco Use   Smoking status: Former    Packs/day: 0.75    Years: 40.00    Pack years: 30.00    Types: Cigarettes    Quit date: 08/23/2018    Years since quitting: 2.0   Smokeless tobacco: Never  Vaping Use   Vaping Use: Never used  Substance and Sexual Activity   Alcohol use: Not Currently    Comment: quit drinking 9 years ago   Drug use: Never   Sexual activity: Not Currently  Other Topics Concern   Not on file  Social History Narrative   09/02/19    From: [place], moved from [place] most recently   Living: alone   Work: retired - Emergency planning/management officer      Family: Daughter - Engineer, site - lives nearby, 2 grandchildren and 1 great-grandson      Enjoys: putter      Exercise: total gym - 1 hour 5 days a week - strength training    Diet: good overall       Safety   Seat belts: Yes    Guns: Yes  and secure   Safe in relationships: Yes    Social Determinants of Corporate investment banker Strain: Not on file  Food Insecurity: Not on file  Transportation Needs: Not on file  Physical Activity: Not on file  Stress: Not on file  Social Connections: Not on file    Tobacco Counseling Counseling given: Not Answered   Clinical Intake:  Pre-visit preparation completed: No  Pain : No/denies pain     BMI - recorded: 23.9 Nutritional Status: BMI of 19-24  Normal Nutritional Risks: None Diabetes: No  How often do you need to have someone help you when you read instructions, pamphlets, or other written materials from your doctor or pharmacy?: 1 - Never What is the last grade level you completed in school?: associates degree  Diabetic?no  Interpreter Needed?: No      Activities of Daily Living In your present state of health, do you have any difficulty performing the following activities: 09/14/2020  Hearing? N  Vision? N  Difficulty concentrating or making decisions? N  Walking or climbing stairs? N  Dressing or bathing? N  Doing errands, shopping? N  Preparing Food and eating ? N  Using the Toilet? N  In the past six months, have you accidently leaked urine? N  Do you have problems with loss of bowel control? N  Managing your Medications? N  Managing your Finances? N  Housekeeping or managing your Housekeeping? N  Some recent data might be hidden    Patient Care Team: Lynnda Child, MD as PCP - General (Family Medicine)  Indicate any recent Medical Services you may have received from other than [facility] providers in the  past year (date may be approximate).     Assessment:   This is a routine wellness examination for Caleb House.  Hearing/Vision screen Hearing Screening   250Hz  500Hz  1000Hz  2000Hz  4000Hz   Right ear 20 20 20 20 20   Left ear 20 20 20 20 20   Vision Screening - Comments:: Last eye exam 8/22 at [facility]   Dietary issues and exercise activities discussed: Current Exercise Habits: Home exercise routine, Type of exercise: walking;strength training/weights, Time (Minutes): 50, Frequency (Times/Week): 7, Weekly Exercise (Minutes/Week): 350, Intensity: Moderate, Exercise limited by: orthopedic condition(s)   Goals  Addressed             This Visit's Progress    Exercise 3x per week (30 min per time)        Depression Screen PHQ 2/9 Scores 09/14/2020 09/14/2020  PHQ - 2 Score 0 0    Fall Risk Fall Risk  09/14/2020  Falls in the past year? 0  Number falls in past yr: 0    FALL RISK PREVENTION PERTAINING TO THE HOME:  Any stairs in or around the home? No  If so, are there any without handrails?  N/a Home free of loose throw rugs in walkways, pet beds, electrical cords, etc? Yes  Adequate lighting in your home to reduce risk of falls? Yes   ASSISTIVE DEVICES UTILIZED TO PREVENT FALLS:  Life alert? No  Use of a cane, walker or w/c? No  Grab bars in the bathroom? Yes  Shower chair or bench in shower? No  Elevated toilet seat or a handicapped toilet? No    Cognitive Function:      Mini-Cog - 09/14/20 0832     Normal clock drawing test? yes    How many words correct? 3                Immunizations Immunization History  Administered Date(s) Administered   PFIZER(Purple Top)SARS-COV-2 Vaccination 02/19/2019, 03/13/2019, 11/05/2019   PNEUMOCOCCAL CONJUGATE-20 09/14/2020    Tdap - pt will get with pharmacy  Flu Vaccine status: Due, Education has been provided regarding the importance of this vaccine. Advised may receive this vaccine at local pharmacy or Health  Dept. Aware to provide a copy of the vaccination record if obtained from local pharmacy or Health Dept. Verbalized acceptance and understanding.  Pneumococcal vaccine status: Due, Education has been provided regarding the importance of this vaccine. Advised may receive this vaccine at local pharmacy or Health Dept. Aware to provide a copy of the vaccination record if obtained from local pharmacy or Health Dept. Verbalized acceptance and understanding.  Covid-19 vaccine status: Completed vaccines  Qualifies for Shingles Vaccine? Yes   Zostavax completed Yes   Shingrix Completed?: No.    Education has been provided regarding the importance of this vaccine. Patient has been advised to call insurance company to determine out of pocket expense if they have not yet received this vaccine. Advised may also receive vaccine at local pharmacy or Health Dept. Verbalized acceptance and understanding.  Screening Tests Health Maintenance  Topic Date Due   TETANUS/TDAP  Never done   Zoster Vaccines- Shingrix (1 of 2) Never done   COLONOSCOPY (Pts 45-2137yrs Insurance coverage will need to be confirmed)  Never done   PNA vac Low Risk Adult (1 of 2 - PCV13) 10/06/2013   COVID-19 Vaccine (4 - Booster for Pfizer series) 02/05/2020   INFLUENZA VACCINE  08/22/2020   Hepatitis C Screening  Completed   HPV VACCINES  Aged Out    Health Maintenance  Health Maintenance Due  Topic Date Due   TETANUS/TDAP  Never done   Zoster Vaccines- Shingrix (1 of 2) Never done   COLONOSCOPY (Pts 45-1237yrs Insurance coverage will need to be confirmed)  Never done   PNA vac Low Risk Adult (1 of 2 - PCV13) 10/06/2013   COVID-19 Vaccine (4 - Booster for Pfizer series) 02/05/2020   INFLUENZA VACCINE  08/22/2020    Colorectal cancer screening: Type of screening: Colonoscopy. Completed >10 years agot. Repeat every 10 years  Lung Cancer Screening: (Low Dose CT Chest recommended if  Age 41-80 years, 30 pack-year currently smoking OR  have quit w/in 15years.) does qualify.   Lung Cancer Screening Referral: yes  Additional Screening:  Hepatitis C Screening: does qualify; Completed yes  Vision Screening: Recommended annual ophthalmology exams for early detection of glaucoma and other disorders of the eye. Is the patient up to date with their annual eye exam?  Yes  Who is the provider or what is the name of the office in which the patient attends annual eye exams? [facility]  If pt is not established with a provider, would they like to be referred to a provider to establish care? No .   Dental Screening: Recommended annual dental exams for proper oral hygiene  Community Resource Referral / Chronic Care Management: CRR required this visit?  No   CCM required this visit?  No      Plan:     I have personally reviewed and noted the following in the patient's chart:   Medical and social history Use of alcohol, tobacco or illicit drugs  Current medications and supplements including opioid prescriptions. Patient is not currently taking opioid prescriptions. Functional ability and status Nutritional status Physical activity Advanced directives List of other physicians Hospitalizations, surgeries, and ER visits in previous 12 months Vitals Screenings to include cognitive, depression, and falls Referrals and appointments  In addition, I have reviewed and discussed with patient certain preventive protocols, quality metrics, and best practice recommendations. A written personalized care plan for preventive services as well as general preventive health recommendations were provided to patient.     Lynnda Child, MD   09/14/2020

## 2020-09-15 ENCOUNTER — Encounter: Payer: Self-pay | Admitting: Family Medicine

## 2020-09-15 ENCOUNTER — Other Ambulatory Visit: Payer: Self-pay | Admitting: Family Medicine

## 2020-09-15 DIAGNOSIS — R972 Elevated prostate specific antigen [PSA]: Secondary | ICD-10-CM

## 2020-09-21 ENCOUNTER — Other Ambulatory Visit: Payer: Self-pay

## 2020-09-21 ENCOUNTER — Telehealth: Payer: Self-pay

## 2020-09-21 DIAGNOSIS — Z1211 Encounter for screening for malignant neoplasm of colon: Secondary | ICD-10-CM

## 2020-09-21 MED ORDER — CLENPIQ 10-3.5-12 MG-GM -GM/160ML PO SOLN: 320.0000 mL | Freq: Once | ORAL | 0 refills | Status: AC

## 2020-09-21 NOTE — Telephone Encounter (Signed)
   [facility] Pre-operative Risk Assessment    Request for surgical clearance:  What type of surgery is being performed? Colonoscopy   When is this surgery scheduled? 10/06/2020  Are there any medications that need to be held prior to surgery and how long?Plavix 10m   Practice name and name of physician performing surgery? [facility]   What is your office phone and fax number? [phone] Anesthesia type (None, local, MAC, general) ? General    [name]8/31/2022, 10:26 AM  _________________________________________________________________   (provider comments below)

## 2020-09-21 NOTE — Telephone Encounter (Signed)
Gastroenterology Pre-Procedure Review  Request Date: 10/06/20 Requesting Physician: Dr. Tobi Bastos   PATIENT REVIEW QUESTIONS: The patient responded to the following health history questions as indicated:    1. Are you having any GI issues? no 2. Do you have a personal history of Polyps? no 3. Do you have a family history of Colon Cancer or Polyps? no 4. Diabetes Mellitus? no 5. Joint replacements in the past 12 months?no 6. Major health problems in the past 3 months?no 7. Any artificial heart valves, MVP, or defibrillator?no    MEDICATIONS & ALLERGIES:    Patient reports the following regarding taking any anticoagulation/antiplatelet therapy:   Plavix, Coumadin, Eliquis, Xarelto, Lovenox, Pradaxa, Brilinta, or Effient? Plavix 75mg   Dr.  Aspirin? Yes Asprin 81mg    Patient confirms/reports the following medications:  Current Outpatient Medications  Medication Sig Dispense Refill   acetaminophen (TYLENOL) 500 MG tablet Take 500 mg by mouth every 6 (six) hours as needed.     aspirin EC 81 MG tablet Take 81 mg by mouth daily. Swallow whole.     Calcium Carbonate Antacid (ALKA-SELTZER ANTACID PO) Take by mouth.     carvedilol (COREG) 6.25 MG tablet TAKE 1 TABLET BY MOUTH TWICE A DAY 180 tablet 1   clopidogrel (PLAVIX) 75 MG tablet Take 1 tablet (75 mg total) by mouth daily. 90 tablet 3   Multiple Vitamins-Minerals (IMMUNE SUPPORT PO) Take by mouth.     Multiple Vitamins-Minerals (PRESERVISION AREDS 2) CHEW Chew by mouth.     rosuvastatin (CRESTOR) 40 MG tablet Take 40 mg by mouth daily.     sacubitril-valsartan (ENTRESTO) 97-103 MG Take 1 tablet by mouth 2 (two) times daily. 60 tablet 11   tadalafil (CIALIS) 10 MG tablet Take 1 tablet (10 mg total) by mouth daily as needed for erectile dysfunction. 10 tablet 1   No current facility-administered medications for this visit.    Patient confirms/reports the following allergies:  Allergies  Allergen Reactions   Brilinta [Ticagrelor]     Spironolactone     Hyperkalemia    No orders of the defined types were placed in this encounter.   AUTHORIZATION INFORMATION Primary Insurance: 1D#: Group #:  Secondary Insurance: 1D#: Group #:  SCHEDULE INFORMATION: Date:  Time: Location:

## 2020-09-21 NOTE — Telephone Encounter (Signed)
   Name: Caleb House  DOB: [birth year]  MRN: [id number]   Primary Cardiologist: Rollene Rotunda, MD  Chart reviewed as part of pre-operative protocol coverage. We have been asked for guidance to hold plavix. Pt has a history of CTO intervention to RCA and LAD in 2020. Per Dr. Antoine Poche, OK to hold plavix for 5-7 days prior to procedure.   I will route this recommendation to the requesting party via Epic fax function and remove from pre-op pool. Please call with questions.  Marcelino Duster, PA 09/21/2020, 4:51 PM

## 2020-09-21 NOTE — Telephone Encounter (Signed)
Returned your call-Patient is ready to get Colonoscopy scheduled.

## 2020-09-21 NOTE — Telephone Encounter (Signed)
Dr. Antoine Poche Pt has CAD with last intervention being CTO in Dec 2020. We are asked to hold plavix for colonoscopy. OK to hold?

## 2020-09-22 ENCOUNTER — Telehealth: Payer: Self-pay

## 2020-09-22 ENCOUNTER — Encounter: Payer: Self-pay | Admitting: Family Medicine

## 2020-09-22 NOTE — Telephone Encounter (Signed)
Per Cardiologist patient needs to stop the plavix 7 days prior to procedure.  Sent mychart message to patient informing him this information and called and left a detail message

## 2020-10-03 ENCOUNTER — Other Ambulatory Visit: Payer: Self-pay

## 2020-10-03 ENCOUNTER — Ambulatory Visit (HOSPITAL_COMMUNITY)
Admission: RE | Admit: 2020-10-03 | Discharge: 2020-10-03 | Disposition: A | Discharge status: Home / Self Care | Payer: Medicare Other | Source: Ambulatory Visit | Attending: Cardiology | Admitting: Cardiology

## 2020-10-03 ENCOUNTER — Other Ambulatory Visit (HOSPITAL_COMMUNITY): Payer: Self-pay | Admitting: Cardiology

## 2020-10-03 DIAGNOSIS — I6523 Occlusion and stenosis of bilateral carotid arteries: Secondary | ICD-10-CM | POA: Insufficient documentation

## 2020-10-03 NOTE — Progress Notes (Signed)
10/04/20 9:08 AM   Caleb House Jun 13, 1950 [id number]  Referring provider:  Lynnda Child, MD [address] [place],  [place] [address] Chief Complaint  Patient presents with   Elevated PSA     HPI: Caleb House is a 70 y.o.male who presents today for further evaluation of elevated PSA.   His last PSA on 09/14/2020 was 5.67 it was previously 1.57 on 09/10/2019.   He is doing well today. He reports that he recently put on Entresto and Lodopin, he is wondering if this change in medication could cause an elevation in PSA.  He reports that he rides a motorcycle and had ridden it close to when he had PSA.   He reports that he had a stent placed in 12/2018.  He is on chronic aspirin and Plavix.  He states that he was kept on Plavix because of the number of stents that he has, longer than he normally would have if he only had 1 or 2.  He is followed by Dr. Perry Lions.    PSA trend:   Component PSA  Latest Ref Rng & Units 0.10 - 4.00 ng/ml  09/10/2019 1.57  09/14/2020 5.67 (H)     IPSS     Row Name 10/04/20 0800         International Prostate Symptom Score   How often have you had the sensation of not emptying your bladder? Less than half the time     How often have you had to urinate less than every two hours? Less than 1 in 5 times     How often have you found you stopped and started again several times when you urinated? About half the time     How often have you found it difficult to postpone urination? Less than 1 in 5 times     How often have you had a weak urinary stream? More than half the time     How often have you had to strain to start urination? Less than 1 in 5 times     How many times did you typically get up at night to urinate? 2 Times     Total IPSS Score 14           Quality of Life due to urinary symptoms   If you were to spend the rest of your life with your urinary condition just the way it is now how would you feel about that? Mixed               Score:  1-7 Mild 8-19 Moderate 20-35 Severe    PMH: Past Medical History:  Diagnosis Date   CAD (coronary artery disease)    CHF (congestive heart failure) (HCC)    HTN (hypertension)     Surgical History: Past Surgical History:  Procedure Laterality Date   CARDIAC CATHETERIZATION     7 stents placed   KNEE SURGERY     Right knee   TONSILLECTOMY      Home Medications:  Allergies as of 10/04/2020       Reactions   Brilinta [ticagrelor]    Spironolactone    Hyperkalemia        Medication List        Accurate as of October 04, 2020  9:08 AM. If you have any questions, ask your nurse or doctor.          acetaminophen 500 MG tablet Commonly known as: TYLENOL Take 500 mg by mouth  every 6 (six) hours as needed.   ALKA-SELTZER ANTACID PO Take by mouth.   aspirin EC 81 MG tablet Take 81 mg by mouth daily. Swallow whole.   carvedilol 6.25 MG tablet Commonly known as: COREG TAKE 1 TABLET BY MOUTH TWICE A DAY   clopidogrel 75 MG tablet Commonly known as: PLAVIX Take 1 tablet (75 mg total) by mouth daily.   Entresto 97-103 MG Generic drug: sacubitril-valsartan Take 1 tablet by mouth 2 (two) times daily.   IMMUNE SUPPORT PO Take by mouth.   rosuvastatin 40 MG tablet Commonly known as: CRESTOR Take 40 mg by mouth daily.   tadalafil 10 MG tablet Commonly known as: Cialis Take 1 tablet (10 mg total) by mouth daily as needed for erectile dysfunction.   tamsulosin 0.4 MG Caps capsule Commonly known as: FLOMAX Take 1 capsule (0.4 mg total) by mouth daily. Started by: Caleb Scotland, MD        Allergies:  Allergies  Allergen Reactions   Brilinta [Ticagrelor]    Spironolactone     Hyperkalemia    Family History: Family History  Problem Relation Age of Onset   Depression Mother    Alcohol abuse Mother    Suicidality Mother    Heart disease Father        No details   Heart disease Brother        No details   Suicidality Maternal  Grandfather    Heart attack Paternal Grandfather 64    Social History:  reports that he quit smoking about 2 years ago. His smoking use included cigarettes. He has a 30.00 pack-year smoking history. He has never used smokeless tobacco. He reports that he does not currently use alcohol. He reports that he does not use drugs.   Physical Exam: BP 114/71   Pulse 60   Ht 5\' 10"  (1.778 m)   Wt 167 lb (75.8 kg)   BMI 23.96 kg/m   Constitutional:  Alert and oriented, No acute distress. HEENT: [place] AT, moist mucus membranes.  Trachea midline, no masses. Cardiovascular: No clubbing, cyanosis, or edema. Respiratory: Normal respiratory effort, no increased work of breathing. Rectal: Normal sphincter tone,  50  CC prostate, firm induration spot at left top left base that a discrete nodule Skin: No rashes, bruises or suspicious lesions. Neurologic: Grossly intact, no focal deficits, moving all 4 extremities. Psychiatric: Normal mood and affect.  Laboratory Data:  Lab Results  Component Value Date   CREATININE 1.97 (H) 09/14/2020    Lab Results  Component Value Date   PSA 5.67 (H) 09/14/2020   PSA 1.57 09/10/2019   Urinalysis negative  Pertinent Imaging: Results for orders placed or performed in visit on 10/04/20  BLADDER SCAN AMB NON-IMAGING  Result Value Ref Range   Scan Result 0 ml      Assessment & Plan:   Elevated PSA/ abnormal rectal exam  - suspicious for prostate cancer   - repeat PSA; pending   - We reviewed the implications of an elevated PSA and the uncertainty surrounding it. In general, a man's PSA increases with age and is produced by both normal and cancerous prostate tissue. Differential for elevated PSA is BPH, prostate cancer, infection, recent intercourse/ejaculation, prostate infarction, recent urethroscopic manipulation (foley placement/cystoscopy) and prostatitis. Management of an elevated PSA can include observation or prostate biopsy and wediscussed this in  detail. We discussed that indications for prostate biopsy are defined by age and race specific PSA cutoffs as well as a PSA velocity  of 0.75/year. All his question were answered.   - Rectal exam showed firm nodule st left top left base.   - If PSA comes back abnormal will strongly advise to pursue prostate biopsy. He will need to stop taking Plavix, we will contact his cardiologist if prostate biopsy is needed. We discussed prostate biopsy in detail including the procedure itself, the risks of blood in the urine, stool, and ejaculate, serious infection, and discomfort. He is willing to proceed with this as discussed, in October.   2. BPH with weak stream  - PVR 79ml today  - IPSS 14 - Flomax trial prescribed   Will call tomorrow with PSA results  I,Kailey Littlejohn,acting as a scribe for Caleb Scotland, MD.,have documented all relevant documentation on the behalf of Caleb Scotland, MD,as directed by  Caleb Scotland, MD while in the presence of Caleb Scotland, MD.  I have reviewed the above documentation for accuracy and completeness, and I agree with the above.   Caleb Scotland, MD   [facility] [address] [place], [place] [address] [phone]

## 2020-10-04 ENCOUNTER — Other Ambulatory Visit: Payer: Self-pay | Admitting: *Deleted

## 2020-10-04 ENCOUNTER — Ambulatory Visit (INDEPENDENT_AMBULATORY_CARE_PROVIDER_SITE_OTHER): Payer: Medicare Other | Admitting: Urology

## 2020-10-04 ENCOUNTER — Other Ambulatory Visit: Payer: Self-pay

## 2020-10-04 VITALS — BP 114/71 | HR 60 | Ht 70.0 in | Wt 167.0 lb

## 2020-10-04 DIAGNOSIS — I255 Ischemic cardiomyopathy: Secondary | ICD-10-CM

## 2020-10-04 DIAGNOSIS — R972 Elevated prostate specific antigen [PSA]: Secondary | ICD-10-CM

## 2020-10-04 DIAGNOSIS — I6523 Occlusion and stenosis of bilateral carotid arteries: Secondary | ICD-10-CM

## 2020-10-04 LAB — BLADDER SCAN AMB NON-IMAGING: Scan Result: 0

## 2020-10-04 MED ORDER — TAMSULOSIN HCL 0.4 MG PO CAPS
0.4000 mg | ORAL_CAPSULE | Freq: Every day | ORAL | 3 refills | Status: DC
Start: 2020-10-04 — End: 2020-10-18

## 2020-10-04 NOTE — Patient Instructions (Signed)

## 2020-10-05 ENCOUNTER — Encounter: Payer: Self-pay | Admitting: Gastroenterology

## 2020-10-05 LAB — URINALYSIS, COMPLETE
Bilirubin, UA: NEGATIVE
Glucose, UA: NEGATIVE
Ketones, UA: NEGATIVE
Leukocytes,UA: NEGATIVE
Nitrite, UA: NEGATIVE
Specific Gravity, UA: >1.03 — ABNORMAL HIGH (ref 1.005–1.030)
Urobilinogen, Ur: 0.2 mg/dL (ref 0.2–1.0)
pH, UA: 5.5 (ref 5.0–7.5)

## 2020-10-05 LAB — PSA: Prostate Specific Ag, Serum: 2.8 ng/mL (ref 0.0–4.0)

## 2020-10-05 LAB — MICROSCOPIC EXAMINATION: Bacteria, UA: NONE SEEN

## 2020-10-06 ENCOUNTER — Telehealth: Payer: Self-pay

## 2020-10-06 ENCOUNTER — Encounter
Admission: RE | Disposition: A | Discharge status: Home / Self Care | Payer: Self-pay | Source: Home / Self Care | Attending: Gastroenterology

## 2020-10-06 ENCOUNTER — Encounter: Payer: Self-pay | Admitting: Gastroenterology

## 2020-10-06 ENCOUNTER — Ambulatory Visit
Admission: RE | Admit: 2020-10-06 | Discharge: 2020-10-06 | Disposition: A | Discharge status: Home / Self Care | Payer: Medicare Other | Attending: Gastroenterology | Admitting: Gastroenterology

## 2020-10-06 ENCOUNTER — Encounter: Payer: Self-pay | Admitting: Urology

## 2020-10-06 ENCOUNTER — Ambulatory Visit: Payer: Medicare Other | Admitting: Certified Registered"

## 2020-10-06 DIAGNOSIS — Z955 Presence of coronary angioplasty implant and graft: Secondary | ICD-10-CM | POA: Diagnosis not present

## 2020-10-06 DIAGNOSIS — Z79899 Other long term (current) drug therapy: Secondary | ICD-10-CM | POA: Diagnosis not present

## 2020-10-06 DIAGNOSIS — K640 First degree hemorrhoids: Secondary | ICD-10-CM | POA: Insufficient documentation

## 2020-10-06 DIAGNOSIS — Z888 Allergy status to other drugs, medicaments and biological substances status: Secondary | ICD-10-CM | POA: Diagnosis not present

## 2020-10-06 DIAGNOSIS — Z1211 Encounter for screening for malignant neoplasm of colon: Secondary | ICD-10-CM | POA: Diagnosis present

## 2020-10-06 DIAGNOSIS — Z8249 Family history of ischemic heart disease and other diseases of the circulatory system: Secondary | ICD-10-CM | POA: Insufficient documentation

## 2020-10-06 DIAGNOSIS — I2510 Atherosclerotic heart disease of native coronary artery without angina pectoris: Secondary | ICD-10-CM | POA: Diagnosis not present

## 2020-10-06 DIAGNOSIS — Z87891 Personal history of nicotine dependence: Secondary | ICD-10-CM | POA: Insufficient documentation

## 2020-10-06 DIAGNOSIS — I110 Hypertensive heart disease with heart failure: Secondary | ICD-10-CM | POA: Insufficient documentation

## 2020-10-06 DIAGNOSIS — I509 Heart failure, unspecified: Secondary | ICD-10-CM | POA: Insufficient documentation

## 2020-10-06 DIAGNOSIS — Z7982 Long term (current) use of aspirin: Secondary | ICD-10-CM | POA: Insufficient documentation

## 2020-10-06 DIAGNOSIS — K5730 Diverticulosis of large intestine without perforation or abscess without bleeding: Secondary | ICD-10-CM | POA: Diagnosis not present

## 2020-10-06 HISTORY — PX: COLONOSCOPY WITH PROPOFOL: SHX5780

## 2020-10-06 SURGERY — COLONOSCOPY WITH PROPOFOL
Anesthesia: General

## 2020-10-06 MED ORDER — LIDOCAINE 2% (20 MG/ML) 5 ML SYRINGE
INTRAMUSCULAR | Status: DC | PRN
Start: 2020-10-06 — End: 2020-10-06
  Administered 2020-10-06: 25 mg via INTRAVENOUS

## 2020-10-06 MED ORDER — PROPOFOL 500 MG/50ML IV EMUL
INTRAVENOUS | Status: AC
  Filled 2020-10-06: qty 150

## 2020-10-06 MED ORDER — LIDOCAINE HCL (PF) 2 % IJ SOLN
INTRAMUSCULAR | Status: AC
  Filled 2020-10-06: qty 5

## 2020-10-06 MED ORDER — PROPOFOL 10 MG/ML IV BOLUS
INTRAVENOUS | Status: DC | PRN
  Administered 2020-10-06: 30 mg via INTRAVENOUS
  Administered 2020-10-06: 70 mg via INTRAVENOUS

## 2020-10-06 MED ORDER — SODIUM CHLORIDE 0.9 % IV SOLN: INTRAVENOUS | Status: DC

## 2020-10-06 MED ORDER — PROPOFOL 500 MG/50ML IV EMUL
INTRAVENOUS | Status: DC | PRN
  Administered 2020-10-06: 120 ug/kg/min via INTRAVENOUS

## 2020-10-06 NOTE — Op Note (Signed)
[facility] Patient Name: Caleb House Procedure Date: 10/06/2020 7:46 AM MRN: [id number] Account #: [id number] Date of Birth: [birth year] Admit Type: Outpatient Age: 70 Room: [facility] ENDO ROOM 3 Gender: Male Note Status: Finalized Instrument Name: Prentice Docker [id number] Procedure:             Colonoscopy Indications:           Screening for colorectal malignant neoplasm Providers:             Wyline Mood MD, MD Referring MD:          Chryl Heck. Selena Batten (Referring MD) Medicines:             Monitored Anesthesia Care Complications:         No immediate complications. Procedure:             Pre-Anesthesia Assessment:                        - Prior to the procedure, a History and Physical was                         performed, and patient medications, allergies and                         sensitivities were reviewed. The patient's tolerance                         of previous anesthesia was reviewed.                        - The risks and benefits of the procedure and the                         sedation options and risks were discussed with the                         patient. All questions were answered and informed                         consent was obtained.                        - ASA Grade Assessment: II - A patient with mild                         systemic disease.                        After obtaining informed consent, the colonoscope was                         passed under direct vision. Throughout the procedure,                         the patient's blood pressure, pulse, and oxygen                         saturations were monitored continuously. The                         Colonoscope was introduced  through the anus and                         advanced to the the cecum, identified by the                         appendiceal orifice. The colonoscopy was performed                         with ease. The patient tolerated the procedure well.                          The quality of the bowel preparation was excellent. Findings:      The perianal and digital rectal examinations were normal.      Non-bleeding internal hemorrhoids were found during retroflexion. The       hemorrhoids were severe, large and Grade I (internal hemorrhoids that do       not prolapse).      Multiple small-mouthed diverticula were found in the sigmoid colon.      The exam was otherwise without abnormality on direct and retroflexion       views. Impression:            - Non-bleeding internal hemorrhoids.                        - Diverticulosis in the sigmoid colon.                        - The examination was otherwise normal on direct and                         retroflexion views.                        - No specimens collected. Recommendation:        - Discharge patient to home (with escort).                        - Resume previous diet.                        - Continue present medications.                        - Repeat colonoscopy is not recommended due to current                         age (68 years or older) for surveillance. Procedure Code(s):     --- Professional ---                        [id number], Colonoscopy, flexible; diagnostic, including                         collection of specimen(s) by brushing or washing, when                         performed (separate procedure) Diagnosis Code(s):     --- Professional ---  Z12.11, Encounter for screening for malignant neoplasm                         of colon                        K64.0, First degree hemorrhoids                        K57.30, Diverticulosis of large intestine without                         perforation or abscess without bleeding CPT copyright 2019 American Medical Association. All rights reserved. The codes documented in this report are preliminary and upon coder review may  be revised to meet current compliance requirements. Wyline Mood, MD Wyline Mood MD,  MD 10/06/2020 8:04:42 AM This report has been signed electronically. Number of Addenda: 0 Note Initiated On: 10/06/2020 7:46 AM Scope Withdrawal Time: 0 hours 8 minutes 36 seconds  Total Procedure Duration: 0 hours 10 minutes 28 seconds  Estimated Blood Loss:  Estimated blood loss: none.      [facility]

## 2020-10-06 NOTE — Transfer of Care (Signed)
Immediate Anesthesia Transfer of Care Note  Patient: Carlester Kasparek  Procedure(s) Performed: COLONOSCOPY WITH PROPOFOL  Patient Location: Endoscopy Unit  Anesthesia Type:General  Level of Consciousness: drowsy  Airway & Oxygen Therapy: Patient Spontanous Breathing  Post-op Assessment: Report given to RN and Post -op Vital signs reviewed and stable  Post vital signs: Reviewed  Last Vitals:  Vitals Value Taken Time  BP 101/68 10/06/20 0804  Temp 35.9 C 10/06/20 0803  Pulse 56 10/06/20 0804  Resp 13 10/06/20 0804  SpO2 97 % 10/06/20 0804  Vitals shown include unvalidated device data.  Last Pain:  Vitals:   10/06/20 0803  TempSrc: Temporal  PainSc: Asleep         Complications: No notable events documented.

## 2020-10-06 NOTE — Telephone Encounter (Signed)
Sw patient. He states he sent dr Apolinar Junes a mychart message and is OK with what she thinks its best. I advised pt I will wait until she gets back to the message. Pt verbalized understanding.

## 2020-10-06 NOTE — Anesthesia Preprocedure Evaluation (Signed)
Anesthesia Evaluation  Patient identified by MRN, date of birth, ID band Patient awake    Reviewed: Allergy & Precautions, NPO status , Patient's Chart, lab work & pertinent test results  Airway Mallampati: II  TM Distance: >3 FB Neck ROM: full    Dental no notable dental hx.    Pulmonary neg pulmonary ROS, former smoker,    Pulmonary exam normal        Cardiovascular hypertension, negative cardio ROS Normal cardiovascular exam     Neuro/Psych negative neurological ROS  negative psych ROS   GI/Hepatic negative GI ROS, Neg liver ROS,   Endo/Other  negative endocrine ROS  Renal/GU negative Renal ROS  negative genitourinary   Musculoskeletal   Abdominal   Peds  Hematology negative hematology ROS (+)   Anesthesia Other Findings Past Medical History: No date: CAD (coronary artery disease) No date: CHF (congestive heart failure) (HCC) No date: HTN (hypertension)  Past Surgical History: No date: CARDIAC CATHETERIZATION     Comment:  7 stents placed No date: KNEE SURGERY     Comment:  Right knee No date: TONSILLECTOMY     Reproductive/Obstetrics negative OB ROS                             Anesthesia Physical Anesthesia Plan  ASA: 3  Anesthesia Plan: General   Post-op Pain Management:    Induction: Intravenous  PONV Risk Score and Plan: Propofol infusion and TIVA  Airway Management Planned: Natural Airway and Nasal Cannula  Additional Equipment:   Intra-op Plan:   Post-operative Plan:   Informed Consent: I have reviewed the patients History and Physical, chart, labs and discussed the procedure including the risks, benefits and alternatives for the proposed anesthesia with the patient or authorized representative who has indicated his/her understanding and acceptance.     Dental Advisory Given  Plan Discussed with: Anesthesiologist, CRNA and Surgeon  Anesthesia Plan  Comments: (Patient consented for risks of anesthesia including but not limited to:  - adverse reactions to medications - risk of airway placement if required - damage to eyes, teeth, lips or other oral mucosa - nerve damage due to positioning  - sore throat or hoarseness - Damage to heart, brain, nerves, lungs, other parts of body or loss of life  Patient voiced understanding.)        Anesthesia Quick Evaluation

## 2020-10-06 NOTE — Telephone Encounter (Signed)
-----   Message from Vanna Scotland, MD sent at 10/06/2020  1:48 PM EDT ----- PSA is coming back down to 2.8 but I am not comfortable with the nodule that I felt on exam.  I am hesitant to want to pursue biopsy because of your history of coronary artery disease and needing to stop aspirin and Plavix.  I do think we may benefit from a prostate MRI to image this area to see if there is radiographic concern for prostate cancer at this location.  Depending on the results, we can decide whether or not to pursue biopsy.  If he would like to discuss this further, we can schedule virtual visit.  If you are happy with this compromise, lets get an MRI and have a follow-up in 3 months with a PSA again prior.  Vanna Scotland, MD

## 2020-10-06 NOTE — H&P (Signed)
Wyline Mood, MD [address], [address], [place], [place], [address] [address], [place], [place], [address] Phone: [phone]  Fax: [phone]  Primary Care Physician:  Lynnda Child, MD   Pre-Procedure History & Physical: HPI:  Caleb House is a 70 y.o. male is here for an colonoscopy.   Past Medical History:  Diagnosis Date   CAD (coronary artery disease)    CHF (congestive heart failure) (HCC)    HTN (hypertension)     Past Surgical History:  Procedure Laterality Date   CARDIAC CATHETERIZATION     7 stents placed   KNEE SURGERY     Right knee   TONSILLECTOMY      Prior to Admission medications   Medication Sig Start Date End Date Taking? Authorizing Provider  aspirin EC 81 MG tablet Take 81 mg by mouth daily. Swallow whole.   Yes [provider]  carvedilol (COREG) 6.25 MG tablet TAKE 1 TABLET BY MOUTH TWICE A DAY 08/01/20  Yes Hochrein, Fayrene Fearing, MD  sacubitril-valsartan (ENTRESTO) 97-103 MG Take 1 tablet by mouth 2 (two) times daily. 01/11/20  Yes Rollene Rotunda, MD  acetaminophen (TYLENOL) 500 MG tablet Take 500 mg by mouth every 6 (six) hours as needed.    [provider]  Calcium Carbonate Antacid (ALKA-SELTZER ANTACID PO) Take by mouth.    [provider]  clopidogrel (PLAVIX) 75 MG tablet Take 1 tablet (75 mg total) by mouth daily. 07/06/20   Rollene Rotunda, MD  Multiple Vitamins-Minerals (IMMUNE SUPPORT PO) Take by mouth.    [provider]  rosuvastatin (CRESTOR) 40 MG tablet Take 40 mg by mouth daily. 08/17/19   [provider]  tadalafil (CIALIS) 10 MG tablet Take 1 tablet (10 mg total) by mouth daily as needed for erectile dysfunction. 08/26/20   Rollene Rotunda, MD  tamsulosin (FLOMAX) 0.4 MG CAPS capsule Take 1 capsule (0.4 mg total) by mouth daily. 10/04/20   Vanna Scotland, MD    Allergies as of 09/21/2020 - Review Complete 09/14/2020  Allergen Reaction Noted   Brilinta [ticagrelor]   03/17/2020   Spironolactone  04/16/2020    Family History  Problem Relation Age of Onset   Depression Mother    Alcohol abuse Mother    Suicidality Mother    Heart disease Father        No details   Heart disease Brother        No details   Suicidality Maternal Grandfather    Heart attack Paternal Grandfather 33    Social History   Socioeconomic History   Marital status: Divorced    Spouse name: Not on file   Number of children: 1   Years of education: college   Highest education level: Not on file  Occupational History   Not on file  Tobacco Use   Smoking status: Former    Packs/day: 0.75    Years: 40.00    Pack years: 30.00    Types: Cigarettes    Quit date: 08/23/2018    Years since quitting: 2.1   Smokeless tobacco: Never  Vaping Use   Vaping Use: Never used  Substance and Sexual Activity   Alcohol use: Not Currently    Comment: quit drinking 9 years ago   Drug use: Never   Sexual activity: Not Currently  Other Topics Concern   Not on file  Social History Narrative   09/02/19   From: [place], moved from [place] most recently  Living: alone   Work: retired - Emergency planning/management officer      Family: Daughter - Engineer, site - lives nearby, 2 grandchildren and 1 great-grandson      Enjoys: putter      Exercise: total gym - 1 hour 5 days a week - strength training    Diet: good overall       Safety   Seat belts: Yes    Guns: Yes  and secure   Safe in relationships: Yes    Social Determinants of Corporate investment banker Strain: Not on file  Food Insecurity: Not on file  Transportation Needs: Not on file  Physical Activity: Not on file  Stress: Not on file  Social Connections: Not on file  Intimate Partner Violence: Not on file    Review of Systems: See HPI, otherwise negative ROS  Physical Exam: There were no vitals taken for this visit. General:   Alert,  pleasant and cooperative in NAD Head:  Normocephalic and atraumatic. Neck:  Supple; no masses or  thyromegaly. Lungs:  Clear throughout to auscultation, normal respiratory effort.    Heart:  +S1, +S2, Regular rate and rhythm, No edema. Abdomen:  Soft, nontender and nondistended. Normal bowel sounds, without guarding, and without rebound.   Neurologic:  Alert and  oriented x4;  grossly normal neurologically.  Impression/Plan: Caleb House is here for an colonoscopy to be performed for Screening colonoscopy average risk   Risks, benefits, limitations, and alternatives regarding  colonoscopy have been reviewed with the patient.  Questions have been answered.  All parties agreeable.   Wyline Mood, MD  10/06/2020, 7:36 AM

## 2020-10-06 NOTE — Anesthesia Postprocedure Evaluation (Signed)
Anesthesia Post Note  Patient: Caleb House  Procedure(s) Performed: COLONOSCOPY WITH PROPOFOL  Patient location during evaluation: Endoscopy Anesthesia Type: General Level of consciousness: awake and alert Pain management: pain level controlled Vital Signs Assessment: post-procedure vital signs reviewed and stable Respiratory status: spontaneous breathing, nonlabored ventilation, respiratory function stable and patient connected to nasal cannula oxygen Cardiovascular status: blood pressure returned to baseline and stable Postop Assessment: no apparent nausea or vomiting Anesthetic complications: no   No notable events documented.   Last Vitals:  Vitals:   10/06/20 0803 10/06/20 0823  BP: 101/68 121/80  Pulse:    Resp:    Temp: (!) 35.9 C   SpO2:      Last Pain:  Vitals:   10/06/20 0823  TempSrc:   PainSc: 0-No pain                 [name]

## 2020-10-07 ENCOUNTER — Other Ambulatory Visit: Payer: Self-pay

## 2020-10-07 ENCOUNTER — Telehealth: Payer: Self-pay

## 2020-10-07 DIAGNOSIS — R972 Elevated prostate specific antigen [PSA]: Secondary | ICD-10-CM

## 2020-10-07 NOTE — Telephone Encounter (Signed)
Patient was scheduled for 3 month f/u with prostate mri and psa. I ordered mri and sent pt instructions. Pt verbalized understanding.

## 2020-10-17 ENCOUNTER — Other Ambulatory Visit: Payer: Self-pay | Admitting: *Deleted

## 2020-10-17 DIAGNOSIS — Z87891 Personal history of nicotine dependence: Secondary | ICD-10-CM

## 2020-10-18 ENCOUNTER — Other Ambulatory Visit: Payer: Self-pay | Admitting: Cardiology

## 2020-10-18 ENCOUNTER — Other Ambulatory Visit: Payer: Self-pay | Admitting: Urology

## 2020-11-07 ENCOUNTER — Telehealth (INDEPENDENT_AMBULATORY_CARE_PROVIDER_SITE_OTHER): Payer: Medicare Other | Admitting: Acute Care

## 2020-11-07 ENCOUNTER — Encounter: Payer: Self-pay | Admitting: Acute Care

## 2020-11-07 DIAGNOSIS — Z87891 Personal history of nicotine dependence: Secondary | ICD-10-CM | POA: Diagnosis not present

## 2020-11-07 NOTE — Patient Instructions (Signed)

## 2020-11-07 NOTE — Progress Notes (Addendum)
Virtual Visit via Video Note  I connected with Caleb House on 11/07/20 at  3:00 PM EDT by a video enabled telemedicine application and verified that I am speaking with the correct person using two identifiers.  Location: Patient: at home Provider: .[phone] [address] [place], [place], [address]    I discussed the limitations of evaluation and management by telemedicine and the availability of in person appointments. The patient expressed understanding and agreed to proceed.  Shared Decision Making Visit Lung Cancer Screening Program [phone])   Eligibility: Age 70 y.o. Pack Years Smoking History Calculation 33 pack year smoking history (# packs/per year x # years smoked) Recent History of coughing up blood  no Unexplained weight loss? no ( >Than 15 pounds within the last 6 months ) Prior History Lung / other cancer no (Diagnosis within the last 5 years already requiring surveillance chest CT Scans). Smoking Status Former Smoker Former Smokers: Years since quit: 2 years  Quit Date: 2020  Visit Components: Discussion included one or more decision making aids. yes Discussion included risk/benefits of screening. yes Discussion included potential follow up diagnostic testing for abnormal scans. yes Discussion included meaning and risk of over diagnosis. yes Discussion included meaning and risk of False Positives. yes Discussion included meaning of total radiation exposure. yes  Counseling Included: Importance of adherence to annual lung cancer LDCT screening. yes Impact of comorbidities on ability to participate in the program. yes Ability and willingness to under diagnostic treatment. yes  Smoking Cessation Counseling: Current Smokers:  Discussed importance of smoking cessation. yes Information about tobacco cessation classes and interventions provided to patient. yes Patient provided with "ticket" for LDCT Scan. yes Symptomatic Patient. no  Counseling NA Diagnosis Code:  Tobacco Use Z72.0 Asymptomatic Patient yes  Counseling (Intermediate counseling: > three minutes counseling) Y1749 Former Smokers:  Discussed the importance of maintaining cigarette abstinence. yes Diagnosis Code: Personal History of Nicotine Dependence. S49.675 Information about tobacco cessation classes and interventions provided to patient. Yes Patient provided with "ticket" for LDCT Scan. yes Written Order for Lung Cancer Screening with LDCT placed in Epic. Yes (CT Chest Lung Cancer Screening Low Dose W/O CM) [id number] Z12.2-Screening of respiratory organs Z87.891-Personal history of nicotine dependence  I spent 25 minutes of face to face time with [name] discussing the risks and benefits of lung cancer screening. We viewed a power point together that explained in detail the above noted topics. We took the time to pause the power point at intervals to allow for questions to be asked and answered to ensure understanding. We discussed that he had taken the single most powerful action possible to decrease his risk of developing lung cancer when he quit smoking. I counseled him to remain smoke free, and to contact me if he ever had the desire to smoke again so that I can provide resources and tools to help support the effort to remain smoke free. We discussed the time and location of the scan, and that either  Abigail Miyamoto RN or I will call with the results within  24-48 hours of receiving them. He has my card and contact information in the event he needs to speak with me, in addition to a copy of the power point we reviewed as a resource. He verbalized understanding of all of the above and had no further questions upon leaving the office.     I explained to the patient that there has been a high incidence of coronary artery disease noted on these  exams. I explained that this is a non-gated exam therefore degree or severity cannot be determined. This patient is on statin therapy. I have asked  the patient to follow-up with their PCP regarding any incidental finding of coronary artery disease and management with diet or medication as they feel is clinically indicated. The patient verbalized understanding of the above and had no further questions.  Pt. Has a cold/ upper respiratory infection. We will cancel his CT Chest scheduled for tomorrow and reschedule in 2-3 weeks when he is better.      [name], NP 11/07/2020

## 2020-11-08 ENCOUNTER — Encounter: Payer: Self-pay | Admitting: Family Medicine

## 2020-11-08 ENCOUNTER — Ambulatory Visit: Payer: Medicare Other | Attending: Acute Care

## 2020-11-08 NOTE — Telephone Encounter (Signed)
Spoke with pt scheduled virtual visit

## 2020-11-09 ENCOUNTER — Encounter: Payer: Self-pay | Admitting: Family Medicine

## 2020-11-09 ENCOUNTER — Other Ambulatory Visit: Payer: Self-pay

## 2020-11-09 ENCOUNTER — Telehealth (INDEPENDENT_AMBULATORY_CARE_PROVIDER_SITE_OTHER): Payer: Medicare Other | Admitting: Family Medicine

## 2020-11-09 VITALS — BP 138/81 | HR 62 | Temp 97.9°F

## 2020-11-09 DIAGNOSIS — J0100 Acute maxillary sinusitis, unspecified: Secondary | ICD-10-CM

## 2020-11-09 MED ORDER — AMOXICILLIN-POT CLAVULANATE 875-125 MG PO TABS: 1.0000 | ORAL_TABLET | Freq: Two times a day (BID) | ORAL | 0 refills | Status: AC

## 2020-11-09 NOTE — Progress Notes (Signed)
I connected with Caleb House on 11/09/20 at  8:00 AM EDT by video and verified that I am speaking with the correct person using two identifiers.   I discussed the limitations, risks, security and privacy concerns of performing an evaluation and management service by video and the availability of in person appointments. I also discussed with the patient that there may be a patient responsible charge related to this service. The patient expressed understanding and agreed to proceed.  Patient location: Home Provider Location: [facility] Participants: Lynnda Child and Caleb House   Subjective:     Caleb House is a 70 y.o. male presenting for Cough (Off and on x 3 weeks. Started like a "cold" and moved to his chest. Currently sx are coughing up grayish phlegm, some SOB, some chest tightness, runny nose, sneezing, sinus pressure/headache. No fever, no body aches. Covid test negative this morning)     Cough This is a new problem. The current episode started 1 to 4 weeks ago. The cough is Productive of sputum, productive of purulent sputum and productive of blood-tinged sputum. Associated symptoms include chills (resolved), nasal congestion, rhinorrhea, a sore throat and shortness of breath (during coughing fits). Pertinent negatives include no ear pain, fever or myalgias. He has tried OTC cough suppressant (dayquil/nyquil) for the symptoms. The treatment provided moderate relief.   Negative covid test Did not get the flu shot No wheezing Worse with post nasal drip  Started taking mucinex  Review of Systems  Constitutional:  Positive for chills (resolved). Negative for fever.  HENT:  Positive for rhinorrhea, sinus pressure and sore throat. Negative for dental problem and ear pain.   Respiratory:  Positive for cough and shortness of breath (during coughing fits).   Musculoskeletal:  Negative for myalgias.    Social History   Tobacco Use  Smoking Status Former    Packs/day: 0.75   Years: 40.00   Pack years: 30.00   Types: Cigarettes   Quit date: 08/23/2018   Years since quitting: 2.2  Smokeless Tobacco Never        Objective:   BP Readings from Last 3 Encounters:  11/09/20 138/81  10/06/20 121/80  10/04/20 114/71   Wt Readings from Last 3 Encounters:  10/06/20 167 lb (75.8 kg)  10/04/20 167 lb (75.8 kg)  09/14/20 166 lb 12 oz (75.6 kg)   BP 138/81   Pulse 62 Comment: going between 62 and 67  Temp 97.9 F (36.6 C)   SpO2 96%    Physical Exam Constitutional:      Appearance: Normal appearance. He is not ill-appearing.  HENT:     Head: Normocephalic and atraumatic.     Right Ear: External ear normal.     Left Ear: External ear normal.  Eyes:     Conjunctiva/sclera: Conjunctivae normal.  Pulmonary:     Effort: Pulmonary effort is normal. No respiratory distress.  Neurological:     Mental Status: He is alert. Mental status is at baseline.  Psychiatric:        Mood and Affect: Mood normal.        Behavior: Behavior normal.        Thought Content: Thought content normal.        Judgment: Judgment normal.            Assessment & Plan:   Problem List Items Addressed This Visit   None Visit Diagnoses     Acute non-recurrent maxillary sinusitis    -  Primary   Relevant Medications   amoxicillin-clavulanate (AUGMENTIN) 875-125 MG tablet      Continue symptomatic care  Add saline rinse/neti pot  Abx for persisting sinus pressure/pain  Return if symptoms worsen or fail to improve.  Lynnda Child, MD

## 2020-11-28 ENCOUNTER — Encounter: Payer: Self-pay | Admitting: Family Medicine

## 2020-12-07 ENCOUNTER — Ambulatory Visit
Admission: RE | Admit: 2020-12-07 | Discharge: 2020-12-07 | Disposition: A | Discharge status: Home / Self Care | Payer: Medicare Other | Source: Ambulatory Visit | Attending: Acute Care | Admitting: Acute Care

## 2020-12-07 ENCOUNTER — Other Ambulatory Visit: Payer: Self-pay

## 2020-12-07 DIAGNOSIS — Z87891 Personal history of nicotine dependence: Secondary | ICD-10-CM | POA: Diagnosis not present

## 2020-12-09 ENCOUNTER — Encounter: Payer: Self-pay | Admitting: Family Medicine

## 2020-12-12 ENCOUNTER — Other Ambulatory Visit: Payer: Self-pay | Admitting: Acute Care

## 2020-12-12 DIAGNOSIS — Z87891 Personal history of nicotine dependence: Secondary | ICD-10-CM

## 2020-12-21 ENCOUNTER — Other Ambulatory Visit: Payer: Self-pay

## 2020-12-21 ENCOUNTER — Ambulatory Visit (INDEPENDENT_AMBULATORY_CARE_PROVIDER_SITE_OTHER): Payer: Medicare Other | Admitting: Nurse Practitioner

## 2020-12-21 VITALS — BP 116/62 | HR 64 | Temp 96.8°F | Resp 14 | Ht 70.0 in | Wt 168.5 lb

## 2020-12-21 DIAGNOSIS — J0140 Acute pansinusitis, unspecified: Secondary | ICD-10-CM

## 2020-12-21 MED ORDER — AMOXICILLIN-POT CLAVULANATE 875-125 MG PO TABS: 1.0000 | ORAL_TABLET | Freq: Two times a day (BID) | ORAL | 0 refills | Status: AC

## 2020-12-21 NOTE — Patient Instructions (Addendum)
Nice to see you Will start on Augmentin. If you do not start to improve over the next 3-4 days please reach out via my chart or call the office. Try to avoid psuedophed

## 2020-12-21 NOTE — Assessment & Plan Note (Signed)
Given patient's length of symptoms and concurrence going to treat what seems to be a sinus infection.  Did discuss with patient that if this does not resolve and require further work-up.  He acknowledged. Start Augmentin 875 twice daily for 7 days.  Continue to monitor

## 2020-12-21 NOTE — Progress Notes (Signed)
Acute Office Visit  Subjective:    Patient ID: Caleb House, male    DOB: December 26, 1950, 70 y.o.   MRN: [id number]  Chief Complaint  Patient presents with   Headache    Sx started about 3 weeks now. Headache around left side of the face around eyes, temple into the jaw area. Post nasal drip, some earache off and on. No fever or chills. Has been taking Sudafed 12 hr, Tylenol 500 mg 2 tablets daily. Covid test negative about a month ago.     Patient is in today for Sinus headache  Symptoms started approx 3 weeks. States that it flares up at night. On left side Eye socket is tender to touch. States teeth hurts on the left side. Hx of sinus infections remotely in the past  Has been using warm compress, tylenol and pseudoephedrine without great relief   Past Medical History:  Diagnosis Date   CAD (coronary artery disease)    CHF (congestive heart failure) (HCC)    HTN (hypertension)     Past Surgical History:  Procedure Laterality Date   CARDIAC CATHETERIZATION     7 stents placed   COLONOSCOPY WITH PROPOFOL N/A 10/06/2020   Procedure: COLONOSCOPY WITH PROPOFOL;  Surgeon: Jonathon Bellows, MD;  Location: [facility] ENDOSCOPY;  Service: Gastroenterology;  Laterality: N/A;   KNEE SURGERY     Right knee   TONSILLECTOMY      Family History  Problem Relation Age of Onset   Depression Mother    Alcohol abuse Mother    Suicidality Mother    Heart disease Father        No details   Heart disease Brother        No details   Suicidality Maternal Grandfather    Heart attack Paternal Grandfather 78    Social History   Socioeconomic History   Marital status: Divorced    Spouse name: Not on file   Number of children: 1   Years of education: college   Highest education level: Not on file  Occupational History   Not on file  Tobacco Use   Smoking status: Former    Packs/day: 0.75    Years: 40.00    Pack years: 30.00    Types: Cigarettes    Quit date: 08/23/2018    Years since  quitting: 2.3   Smokeless tobacco: Never  Vaping Use   Vaping Use: Never used  Substance and Sexual Activity   Alcohol use: Not Currently    Comment: quit drinking 9 years ago   Drug use: Never   Sexual activity: Not Currently  Other Topics Concern   Not on file  Social History Narrative   09/02/19   From: [place], moved from [place] most recently   Living: alone   Work: retired - Engineer, structural      Family: Daughter - Theatre manager - lives nearby, 2 grandchildren and 1 great-grandson      Enjoys: putter      Exercise: total gym - 1 hour 5 days a week - strength training    Diet: good overall       Safety   Seat belts: Yes    Guns: Yes  and secure   Safe in relationships: Yes    Social Determinants of Radio broadcast assistant Strain: Not on file  Food Insecurity: Not on file  Transportation Needs: Not on file  Physical Activity: Not on file  Stress: Not on file  Social Connections: Not  on file  Intimate Partner Violence: Not on file    Outpatient Medications Prior to Visit  Medication Sig Dispense Refill   acetaminophen (TYLENOL) 500 MG tablet Take 500 mg by mouth every 6 (six) hours as needed.     aspirin EC 81 MG tablet Take 81 mg by mouth daily. Swallow whole.     Calcium Carbonate Antacid (ALKA-SELTZER ANTACID PO) Take by mouth.     carvedilol (COREG) 6.25 MG tablet TAKE 1 TABLET BY MOUTH TWICE A DAY 180 tablet 1   clopidogrel (PLAVIX) 75 MG tablet Take 1 tablet (75 mg total) by mouth daily. 90 tablet 3   Multiple Vitamins-Minerals (IMMUNE SUPPORT PO) Take by mouth.     rosuvastatin (CRESTOR) 40 MG tablet Take 40 mg by mouth daily.     sacubitril-valsartan (ENTRESTO) 97-103 MG Take 1 tablet by mouth 2 (two) times daily. 60 tablet 11   tadalafil (CIALIS) 10 MG tablet TAKE 1 TABLET BY MOUTH DAILY AS NEEDED FOR ERECTILE DYSFUNCTION 10 tablet 1   tamsulosin (FLOMAX) 0.4 MG CAPS capsule TAKE 1 CAPSULE BY MOUTH EVERY DAY (Patient not taking: Reported on 11/09/2020) 90  capsule 1   No facility-administered medications prior to visit.    Allergies  Allergen Reactions   Brilinta [Ticagrelor]    Spironolactone     Hyperkalemia    Review of Systems  Constitutional:  Negative for chills and fever.  HENT:  Positive for congestion, ear pain (left), postnasal drip, sinus pressure and sinus pain. Negative for ear discharge and sore throat.   Respiratory:  Positive for cough. Negative for shortness of breath.   Cardiovascular:  Negative for chest pain.  Gastrointestinal:  Negative for diarrhea, nausea and vomiting.  Musculoskeletal:  Negative for arthralgias and myalgias.  Neurological:  Positive for headaches. Negative for weakness and numbness.      Objective:    Physical Exam Constitutional:      Appearance: He is well-developed.  HENT:     Right Ear: Tympanic membrane, ear canal and external ear normal. There is no impacted cerumen.     Left Ear: Tympanic membrane, ear canal and external ear normal. There is no impacted cerumen.     Nose:     Left Sinus: Maxillary sinus tenderness and frontal sinus tenderness present.     Mouth/Throat:     Mouth: Mucous membranes are moist.     Dentition: Dental caries present. No dental abscesses.     Comments: cobblestoning Eyes:     Extraocular Movements: Extraocular movements intact.  Cardiovascular:     Rate and Rhythm: Normal rate and regular rhythm.  Pulmonary:     Effort: Pulmonary effort is normal.     Breath sounds: Normal breath sounds.  Lymphadenopathy:     Cervical: No cervical adenopathy.  Neurological:     Mental Status: He is alert.     Sensory: No sensory deficit.     Motor: No weakness.     Coordination: Coordination normal.     Gait: Gait normal.     Deep Tendon Reflexes: Reflexes normal.  Psychiatric:        Mood and Affect: Mood normal.    BP 116/62   Pulse 64   Temp (!) 96.8 F (36 C)   Resp 14   Ht $R'5\' 10"'zM$  (1.778 m)   Wt 168 lb 8 oz (76.4 kg)   SpO2 96%   BMI 24.18  kg/m  Wt Readings from Last 3 Encounters:  12/21/20 168 lb 8  oz (76.4 kg)  12/07/20 165 lb (74.8 kg)  10/06/20 167 lb (75.8 kg)    Health Maintenance Due  Topic Date Due   COVID-19 Vaccine (4 - Booster for Pfizer series) 12/31/2019    There are no preventive care reminders to display for this patient.   No results found for: TSH Lab Results  Component Value Date   WBC 5.3 09/10/2019   HGB 12.0 (L) 09/10/2019   HCT 36.5 (L) 09/10/2019   MCV 89.3 09/10/2019   PLT 207.0 09/10/2019   Lab Results  Component Value Date   NA 138 09/14/2020   K 4.8 09/14/2020   CO2 20 09/14/2020   GLUCOSE 98 09/14/2020   BUN 36 (H) 09/14/2020   CREATININE 1.97 (H) 09/14/2020   BILITOT 0.5 09/14/2020   ALKPHOS 84 09/14/2020   AST 17 09/14/2020   ALT 13 09/14/2020   PROT 6.0 09/14/2020   ALBUMIN 3.9 09/14/2020   CALCIUM 9.2 09/14/2020   EGFR 40 (L) 05/17/2020   GFR 33.46 (L) 09/14/2020   Lab Results  Component Value Date   CHOL 104 09/14/2020   Lab Results  Component Value Date   HDL 31.60 (L) 09/14/2020   Lab Results  Component Value Date   LDLCALC 56 09/14/2020   Lab Results  Component Value Date   TRIG 83.0 09/14/2020   Lab Results  Component Value Date   CHOLHDL 3 09/14/2020   No results found for: HGBA1C     Assessment & Plan:   Problem List Items Addressed This Visit       Respiratory   Acute non-recurrent pansinusitis - Primary    Given patient's length of symptoms and concurrence going to treat what seems to be a sinus infection.  Did discuss with patient that if this does not resolve and require further work-up.  He acknowledged. Start Augmentin 875 twice daily for 7 days.  Continue to monitor      Relevant Medications   amoxicillin-clavulanate (AUGMENTIN) 875-125 MG tablet     No orders of the defined types were placed in this encounter.  This visit occurred during the SARS-CoV-2 public health emergency.  Safety protocols were in place, including  screening questions prior to the visit, additional usage of staff PPE, and extensive cleaning of exam room while observing appropriate contact time as indicated for disinfecting solutions.   Romilda Garret, NP

## 2020-12-25 ENCOUNTER — Other Ambulatory Visit: Payer: Self-pay | Admitting: Cardiology

## 2020-12-27 ENCOUNTER — Encounter: Payer: Self-pay | Admitting: Nurse Practitioner

## 2021-01-02 ENCOUNTER — Other Ambulatory Visit: Payer: Self-pay

## 2021-01-02 ENCOUNTER — Ambulatory Visit
Admission: RE | Admit: 2021-01-02 | Discharge: 2021-01-02 | Disposition: A | Discharge status: Home / Self Care | Payer: Medicare Other | Source: Ambulatory Visit | Attending: Urology | Admitting: Urology

## 2021-01-02 DIAGNOSIS — R972 Elevated prostate specific antigen [PSA]: Secondary | ICD-10-CM

## 2021-01-02 MED ORDER — GADOBUTROL 1 MMOL/ML IV SOLN
7.5000 mL | Freq: Once | INTRAVENOUS | Status: AC | PRN
  Administered 2021-01-02: 7.5 mL via INTRAVENOUS

## 2021-01-06 ENCOUNTER — Other Ambulatory Visit: Payer: Self-pay

## 2021-01-06 ENCOUNTER — Other Ambulatory Visit: Payer: Medicare Other

## 2021-01-06 DIAGNOSIS — R972 Elevated prostate specific antigen [PSA]: Secondary | ICD-10-CM

## 2021-01-07 LAB — PSA: Prostate Specific Ag, Serum: 1.8 ng/mL (ref 0.0–4.0)

## 2021-01-10 NOTE — Progress Notes (Signed)
01/11/21 11:09 AM   Caleb House [birth year] [id number]  Referring provider:  Lynnda Child, MD [address] [place],  [place] [address] Chief Complaint  Patient presents with   Elevated PSA     HPI: Caleb House is a 70 y.o.male with a personal history of elevated PSA.  He had a recent PSA on 01/06/2021 that was 1.8 with a previous PSA of 2.8 on 07/04/2020. His PSA has been as high as 5.67 this year.   Prostate MRI on 01/02/2021 showed no evidence of high-risk lesion. Overall assessment PIRADS category 2. Changes of BPH and presumed sequela of prostatitis.  He is doing well today. He reports that he has experienced intermittent urination during the nighttime as well as weak stream. He reports that when this happens he does not notice any behavioral changes that could cause this.   He reports that he was unsure whether he would like to take Flomax due to the side effects that he read on the prescription bottle.  He is never tried it.  Component PSA  Latest Ref Rng & Units 0.10 - 4.00 ng/ml  09/10/2019 1.57  09/14/2020 5.67 (H)   10/04/2020 2.8  01/06/2021 1.8    PMH: Past Medical History:  Diagnosis Date   CAD (coronary artery disease)    CHF (congestive heart failure) (HCC)    HTN (hypertension)     Surgical History: Past Surgical History:  Procedure Laterality Date   CARDIAC CATHETERIZATION     7 stents placed   COLONOSCOPY WITH PROPOFOL N/A 10/06/2020   Procedure: COLONOSCOPY WITH PROPOFOL;  Surgeon: Wyline Mood, MD;  Location: [facility] ENDOSCOPY;  Service: Gastroenterology;  Laterality: N/A;   KNEE SURGERY     Right knee   TONSILLECTOMY      Home Medications:  Allergies as of 01/11/2021       Reactions   Brilinta [ticagrelor]    Spironolactone    Hyperkalemia        Medication List        Accurate as of January 11, 2021 11:09 AM. If you have any questions, ask your nurse or doctor.          acetaminophen 500 MG tablet Commonly known as:  TYLENOL Take 500 mg by mouth every 6 (six) hours as needed.   ALKA-SELTZER ANTACID PO Take by mouth.   aspirin EC 81 MG tablet Take 81 mg by mouth daily. Swallow whole.   carvedilol 6.25 MG tablet Commonly known as: COREG TAKE 1 TABLET BY MOUTH TWICE A DAY   clopidogrel 75 MG tablet Commonly known as: PLAVIX Take 1 tablet (75 mg total) by mouth daily.   Entresto 97-103 MG Generic drug: sacubitril-valsartan TAKE 1 TABLET BY MOUTH TWICE A DAY   IMMUNE SUPPORT PO Take by mouth.   rosuvastatin 40 MG tablet Commonly known as: CRESTOR Take 40 mg by mouth daily.   tadalafil 10 MG tablet Commonly known as: CIALIS TAKE 1 TABLET BY MOUTH DAILY AS NEEDED FOR ERECTILE DYSFUNCTION        Allergies:  Allergies  Allergen Reactions   Brilinta [Ticagrelor]    Spironolactone     Hyperkalemia    Family History: Family History  Problem Relation Age of Onset   Depression Mother    Alcohol abuse Mother    Suicidality Mother    Heart disease Father        No details   Heart disease Brother        No details  Suicidality Maternal Grandfather    Heart attack Paternal Grandfather 79    Social History:  reports that he quit smoking about 2 years ago. His smoking use included cigarettes. He has a 30.00 pack-year smoking history. He has never used smokeless tobacco. He reports that he does not currently use alcohol. He reports that he does not use drugs.   Physical Exam: BP 138/84    Ht 5\' 10"  (1.778 m)    Wt 168 lb (76.2 kg)    BMI 24.11 kg/m   Constitutional:  Alert and oriented, No acute distress. HEENT: [place] AT, moist mucus membranes.  Trachea midline, no masses. Cardiovascular: No clubbing, cyanosis, or edema. Respiratory: Normal respiratory effort, no increased work of breathing. Skin: No rashes, bruises or suspicious lesions. Neurologic: Grossly intact, no focal deficits, moving all 4 extremities. Psychiatric: Normal mood and affect.  Laboratory Data:  Lab Results   Component Value Date   CREATININE 1.97 (H) 09/14/2020   Lab Results  Component Value Date   PSA 5.67 (H) 09/14/2020   PSA 1.57 09/10/2019   Pertinent Imaging: CLINICAL DATA:  Enlarged prostate, elevated PSA by report. Most recent PSA of 2.8.   EXAM: MR PROSTATE WITHOUT AND WITH CONTRAST   TECHNIQUE: Multiplanar multisequence MRI images were obtained of the pelvis centered about the prostate. Pre and post contrast images were obtained.   CONTRAST:  7.5mL GADAVIST GADOBUTROL 1 MMOL/ML IV SOLN   COMPARISON:  None   FINDINGS: Prostate: Exam is mildly compromised by patient motion.   Transitional zone: Signs of BPH in the transitional zone. No high-risk lesion.   Peripheral zone: Linear and wedge-shaped areas of T2 hypointensity, generalized intermediate T2 signal throughout the peripheral zone. Preservation of volume in the peripheral zone bilaterally.   Volume: 4.8 x 4.0 x 3.9 (volume = 39 cc) cm   Transcapsular spread:  Absent   Seminal vesicle involvement: Absent   Neurovascular bundle involvement: Absent   Pelvic adenopathy: Absent   Bone metastasis: Absent   Other findings: Colonic diverticulosis.   IMPRESSION: No evidence of high-risk lesion. Overall assessment PIRADS category 2.   Changes of BPH and presumed sequela of prostatitis.     Electronically Signed   By: 4m M.D.   On: 01/02/2021 21:31  MRI images were personally reviewed.  Agree with radiologic education.  Assessment & Plan:    Elevated PSA - Prostate MRI was reassuring with NED  - PSA has trended back down -We will plan to perform routine annual screening again next year with PSA/DRE  BPH with weak stream  - Symptoms are not bothersome  - Discussed different treatment options such as optimization of medications versus outlet procedure. He is not interested in outlet procedure.  - He has Flomax prescribed he is willing to proceed with this medication, we will recheck if  he is taking this medication in 1 year.   Return in 1 year for IPSS, PSA, and DRE   I,Caleb House,acting as a scribe for 14/12/2020, MD.,have documented all relevant documentation on the behalf of Caleb Scotland, MD,as directed by  Caleb Scotland, MD while in the presence of Caleb Scotland, MD.  I have reviewed the above documentation for accuracy and completeness, and I agree with the above.   Caleb Scotland, MD   [facility] [address] [place], [place] [phone]

## 2021-01-11 ENCOUNTER — Encounter: Payer: Self-pay | Admitting: Urology

## 2021-01-11 ENCOUNTER — Ambulatory Visit (INDEPENDENT_AMBULATORY_CARE_PROVIDER_SITE_OTHER): Payer: Medicare Other | Admitting: Urology

## 2021-01-11 ENCOUNTER — Other Ambulatory Visit: Payer: Self-pay

## 2021-01-11 VITALS — BP 138/84 | Ht 70.0 in | Wt 168.0 lb

## 2021-01-11 DIAGNOSIS — I255 Ischemic cardiomyopathy: Secondary | ICD-10-CM

## 2021-01-11 DIAGNOSIS — R972 Elevated prostate specific antigen [PSA]: Secondary | ICD-10-CM

## 2021-01-23 ENCOUNTER — Other Ambulatory Visit: Payer: Self-pay | Admitting: Cardiology

## 2021-02-22 ENCOUNTER — Other Ambulatory Visit: Payer: Self-pay | Admitting: Family Medicine

## 2021-02-28 ENCOUNTER — Other Ambulatory Visit: Payer: Self-pay | Admitting: Family Medicine

## 2021-03-15 ENCOUNTER — Other Ambulatory Visit: Payer: Self-pay | Admitting: Cardiology

## 2021-04-12 ENCOUNTER — Other Ambulatory Visit: Payer: Self-pay | Admitting: Cardiology

## 2021-04-19 ENCOUNTER — Other Ambulatory Visit: Payer: Self-pay | Admitting: Primary Care

## 2021-04-19 ENCOUNTER — Other Ambulatory Visit: Payer: Self-pay | Admitting: Cardiology

## 2021-04-20 NOTE — Telephone Encounter (Signed)
Please call pt to see if he would like more than 10 tablets.  ?

## 2021-04-21 NOTE — Telephone Encounter (Signed)
Mychart sent to pt.

## 2021-04-21 NOTE — Telephone Encounter (Signed)
Pt's response: ten is fine but would it be possible to have a couple of refills added to the script?  They are just so expensive buying more than 10 at a time is a bit rough and they really don't work well anyway.  ?

## 2021-04-24 ENCOUNTER — Other Ambulatory Visit: Payer: Self-pay | Admitting: Family Medicine

## 2021-04-24 ENCOUNTER — Other Ambulatory Visit: Payer: Self-pay

## 2021-04-24 MED ORDER — TADALAFIL 10 MG PO TABS: ORAL_TABLET | ORAL | 1 refills | Status: DC

## 2021-04-24 NOTE — Telephone Encounter (Signed)
If ineffective, would recommend OV with me or discuss with urology alternatives at upcoming appointment ?

## 2021-04-24 NOTE — Telephone Encounter (Signed)
I spoke to pt and told him that 20 tablets at [facility] with [facility] would only cost him $14. Pt was very happy about this information and would like the script sent there. I will cancel the Rx sent to [facility] and send a refill request to PCP. ?

## 2021-06-10 ENCOUNTER — Other Ambulatory Visit: Payer: Self-pay | Admitting: Cardiology

## 2021-07-01 ENCOUNTER — Other Ambulatory Visit: Payer: Self-pay | Admitting: Cardiology

## 2021-07-03 ENCOUNTER — Other Ambulatory Visit: Payer: Self-pay

## 2021-07-03 MED ORDER — ENTRESTO 97-103 MG PO TABS: 1.0000 | ORAL_TABLET | Freq: Two times a day (BID) | ORAL | 1 refills | Status: DC

## 2021-07-07 ENCOUNTER — Other Ambulatory Visit: Payer: Medicare Other

## 2021-07-07 DIAGNOSIS — R972 Elevated prostate specific antigen [PSA]: Secondary | ICD-10-CM

## 2021-07-08 LAB — PSA: Prostate Specific Ag, Serum: 1.6 ng/mL (ref 0.0–4.0)

## 2021-07-12 ENCOUNTER — Ambulatory Visit (INDEPENDENT_AMBULATORY_CARE_PROVIDER_SITE_OTHER): Payer: Medicare Other | Admitting: Urology

## 2021-07-12 ENCOUNTER — Encounter: Payer: Self-pay | Admitting: Urology

## 2021-07-12 VITALS — BP 100/63 | HR 87 | Ht 70.0 in | Wt 172.0 lb

## 2021-07-12 DIAGNOSIS — R3912 Poor urinary stream: Secondary | ICD-10-CM

## 2021-07-12 DIAGNOSIS — N401 Enlarged prostate with lower urinary tract symptoms: Secondary | ICD-10-CM

## 2021-07-12 DIAGNOSIS — N529 Male erectile dysfunction, unspecified: Secondary | ICD-10-CM

## 2021-07-12 DIAGNOSIS — R972 Elevated prostate specific antigen [PSA]: Secondary | ICD-10-CM

## 2021-07-12 LAB — BLADDER SCAN AMB NON-IMAGING

## 2021-07-12 MED ORDER — SILDENAFIL CITRATE 20 MG PO TABS
20.0000 mg | ORAL_TABLET | ORAL | 11 refills | Status: DC | PRN
Start: 2021-07-12 — End: 2021-09-22

## 2021-07-12 NOTE — Progress Notes (Signed)
07/12/2021 2:40 PM   Caleb House [birth year] [id number]  Referring provider:  Lynnda Child, MD [address] [place],  [place] [address] Chief Complaint  Patient presents with   Elevated PSA    87mo w/     HPI: Caleb House is a 71 y.o.male with a personal history of elevated PSA who presents today for a 6 month follow-up with PSA, IPSS, and PVR.   He has a recent PSA of 1.6 on 07/07/2021 with a previous PSA of 1.8 on 01/06/2022.   Prostate MRI on 01/02/2021 showed no evidence of high-risk lesion. Overall assessment PIRADS category 2. Changes of BPH and presumed sequela of prostatitis.  He has stopped taking flomax in light of side effects of blood pressure. He is interested in a different medication for ED today he has tried highest dosage of cialis without improvement.   IPSS 9 below, PVR 66 ml tday.     IPSS     Row Name 07/12/21 1100         International Prostate Symptom Score   How often have you had the sensation of not emptying your bladder? Less than half the time     How often have you had to urinate less than every two hours? Less than 1 in 5 times     How often have you found you stopped and started again several times when you urinated? Less than half the time     How often have you found it difficult to postpone urination? Not at All     How often have you had a weak urinary stream? Less than half the time     How often have you had to strain to start urination? Not at All     How many times did you typically get up at night to urinate? 2 Times     Total IPSS Score 9       Quality of Life due to urinary symptoms   If you were to spend the rest of your life with your urinary condition just the way it is now how would you feel about that? Mixed              Score:  1-7 Mild 8-19 Moderate 20-35 Severe   PMH: Past Medical History:  Diagnosis Date   CAD (coronary artery disease)    CHF (congestive heart failure) (HCC)    HTN (hypertension)      Surgical History: Past Surgical History:  Procedure Laterality Date   CARDIAC CATHETERIZATION     7 stents placed   COLONOSCOPY WITH PROPOFOL N/A 10/06/2020   Procedure: COLONOSCOPY WITH PROPOFOL;  Surgeon: Wyline Mood, MD;  Location: [facility] ENDOSCOPY;  Service: Gastroenterology;  Laterality: N/A;   KNEE SURGERY     Right knee   TONSILLECTOMY      Home Medications:  Allergies as of 07/12/2021       Reactions   Brilinta [ticagrelor]    Spironolactone    Hyperkalemia        Medication List        Accurate as of July 12, 2021 11:59 PM. If you have any questions, ask your nurse or doctor.          STOP taking these medications    Entresto 97-103 MG Generic drug: sacubitril-valsartan Stopped by: Vanna Scotland, MD   tadalafil 10 MG tablet Commonly known as: CIALIS Stopped by: Vanna Scotland, MD       TAKE these medications  acetaminophen 500 MG tablet Commonly known as: TYLENOL Take 500 mg by mouth every 6 (six) hours as needed.   ALKA-SELTZER ANTACID PO Take by mouth.   aspirin EC 81 MG tablet Take 81 mg by mouth daily. Swallow whole.   carvedilol 6.25 MG tablet Commonly known as: COREG TAKE 1 TABLET BY MOUTH TWICE A DAY   clopidogrel 75 MG tablet Commonly known as: PLAVIX Take 1 tablet (75 mg total) by mouth daily.   IMMUNE SUPPORT PO Take by mouth.   rosuvastatin 40 MG tablet Commonly known as: CRESTOR Take 40 mg by mouth daily.   sildenafil 20 MG tablet Commonly known as: REVATIO Take 1 tablet (20 mg total) by mouth as needed. 3-5 tablets as needed prior to intercourse Started by: Vanna Scotland, MD        Allergies:  Allergies  Allergen Reactions   Brilinta [Ticagrelor]    Spironolactone     Hyperkalemia    Family History: Family History  Problem Relation Age of Onset   Depression Mother    Alcohol abuse Mother    Suicidality Mother    Heart disease Father        No details   Heart disease Brother        No details    Suicidality Maternal Grandfather    Heart attack Paternal Grandfather 46    Social History:  reports that he quit smoking about 2 years ago. His smoking use included cigarettes. He has a 30.00 pack-year smoking history. He has never used smokeless tobacco. He reports that he does not currently use alcohol. He reports that he does not use drugs.   Physical Exam: BP 100/63   Pulse 87   Ht 5\' 10"  (1.778 m)   Wt 172 lb (78 kg)   BMI 24.68 kg/m   Constitutional:  Alert and oriented, No acute distress. HEENT:  AT, moist mucus membranes.  Trachea midline, no masses. Cardiovascular: No clubbing, cyanosis, or edema. Respiratory: Normal respiratory effort, no increased work of breathing. Rectal: Normal sphincter tone,  50  CC prostate, smooth no nodules possible rubbery nodule at right apex and stable induration Skin: No rashes, bruises or suspicious lesions. Neurologic: Grossly intact, no focal deficits, moving all 4 extremities. Psychiatric: Normal mood and affect.  Laboratory Data:  Lab Results  Component Value Date   CREATININE 1.97 (H) 09/14/2020    Pertinent Imaging: Results for orders placed or performed in visit on 07/12/21  BLADDER SCAN AMB NON-IMAGING  Result Value Ref Range   Scan Result 81ml     Assessment & Plan:    BPH with weak stream  - Symptoms are not bothersome  - He is emptying adequately with a PVR of 80ml.  - He has stopped taking flomax in light of his blood pressure   2. Abnormal rectal exam  -Will continue to follow  - given stability and low PSA  3. Erectile dysfunction  - Cialis is not always affective he has tried higher doses - Prescribed sildenafil up to 100 mg, discussed optimal administration  Return in about 1 year (around 07/13/2022) for 1 year w/PSA, IPSS, PVR.  07/15/2022 as a Tawni Millers for Neurosurgeon, MD.,have documented all relevant documentation on the behalf of Vanna Scotland, MD,as directed by  Vanna Scotland, MD  while in the presence of Vanna Scotland, MD.  I have reviewed the above documentation for accuracy and completeness, and I agree with the above.   Vanna Scotland, MD   [facility] [address] [place], [place] [address] [phone]

## 2021-07-27 NOTE — Progress Notes (Signed)
Cardiology Office Note   Date:  07/28/2021   ID:  Caleb House, DOB Jan 31, 1950, MRN [id number]  PCP:  Caleb Child, MD  Cardiologist:   Caleb Rotunda, MD Referring:  Caleb Child, MD   Chief Complaint  Patient presents with   Dizziness   Fatigue     History of Present Illness: Caleb House is a 71 y.o. male who is referred by Caleb Child, MD for evaluation of CAD.  He came for [facility].   He has had severe coronary artery disease.  He had LAD and RCA chronic total obstruction and high-grade disease in his circumflex treated with PCI on September 09, 2018. He was turned down for bypass because of his severe COPD.  His ejection fraction in July 2020 was 38%.  He did have PET scanning that demonstrated moderate sized full-thickness defect in the mid to distal anterior wall and apex.  He had viable anterior and lateral wall.  He had LAD CTO revascularization.  He had ADR/Sting-Ray on October 23, 2018.   He had staged RCA CTO revascularization on January 01, 2019.  Of note he could not tolerate Brilinta secondary to shortness of breath.  His ejection fraction was 38%.   At the last visit I tried to start spironolactone but he had hyperkalemia.  His EF was 40 - 45% on echo in May of 2022.       Since I last saw him he has had lightheadedness.  About 2 hours after taking his morning medicines he becomes fatigued.  He might get somewhat dizzy.  He has not had any new shortness of breath, PND or orthopnea.  He has not had any new palpitations.  He has had no chest pressure, neck or arm discomfort.  He has had no weight gain or edema.   Past Medical History:  Diagnosis Date   CAD (coronary artery disease)    CHF (congestive heart failure) (HCC)    HTN (hypertension)     Past Surgical History:  Procedure Laterality Date   CARDIAC CATHETERIZATION     7 stents placed   COLONOSCOPY WITH PROPOFOL N/A 10/06/2020   Procedure: COLONOSCOPY WITH PROPOFOL;  Surgeon: Caleb Mood, MD;   Location: [facility] ENDOSCOPY;  Service: Gastroenterology;  Laterality: N/A;   KNEE SURGERY     Right knee   TONSILLECTOMY       Current Outpatient Medications  Medication Sig Dispense Refill   acetaminophen (TYLENOL) 500 MG tablet Take 500 mg by mouth every 6 (six) hours as needed.     aspirin EC 81 MG tablet Take 81 mg by mouth daily. Swallow whole.     Calcium Carbonate Antacid (ALKA-SELTZER ANTACID PO) Take by mouth.     carvedilol (COREG) 6.25 MG tablet TAKE 1 TABLET BY MOUTH TWICE A DAY 180 tablet 1   clopidogrel (PLAVIX) 75 MG tablet Take 1 tablet (75 mg total) by mouth daily. 90 tablet 0   Multiple Vitamins-Minerals (IMMUNE SUPPORT PO) Take by mouth.     rosuvastatin (CRESTOR) 40 MG tablet Take 40 mg by mouth daily.     sacubitril-valsartan (ENTRESTO) 49-51 MG Take 1 tablet by mouth 2 (two) times daily. 180 tablet 3   sildenafil (REVATIO) 20 MG tablet Take 1 tablet (20 mg total) by mouth as needed. 3-5 tablets as needed prior to intercourse 30 tablet 11   No current facility-administered medications for this visit.    Allergies:   Brilinta [ticagrelor] and Spironolactone  ROS:  Please see the history of present illness.   Otherwise, review of systems are positive for none.   All other systems are reviewed and negative.    PHYSICAL EXAM: VS:  BP 96/60   Pulse (!) 53   Ht 5\' 10"  (1.778 m)   Wt 171 lb 3.2 oz (77.7 kg)   SpO2 98%   BMI 24.56 kg/m  , BMI Body mass index is 24.56 kg/m. GENERAL:  Well appearing NECK:  No jugular venous distention, waveform within normal limits, carotid upstroke brisk and symmetric, no bruits, no thyromegaly LUNGS:  Clear to auscultation bilaterally CHEST:  Unremarkable HEART:  PMI not displaced or sustained,S1 and S2 within normal limits, no S3, no S4, no clicks, no rubs, no murmurs ABD:  Flat, positive bowel sounds normal in frequency in pitch, no bruits, no rebound, no guarding, no midline pulsatile mass, no hepatomegaly, no  splenomegaly EXT:  2 plus pulses throughout, no edema, no cyanosis no clubbing   EKG:  EKG is   ordered today. Sinus rhythm, rate 53, first-degree AV block, old anteroseptal infarct with chronic T wave inversions unchanged from previous.  Recent Labs: 09/14/2020: ALT 13; BUN 36; Creatinine, Ser 1.97; Potassium 4.8; Sodium 138    Lipid Panel    Component Value Date/Time   CHOL 104 09/14/2020 0903   TRIG 83.0 09/14/2020 0903   HDL 31.60 (L) 09/14/2020 0903   CHOLHDL 3 09/14/2020 0903   VLDL 16.6 09/14/2020 0903   LDLCALC 56 09/14/2020 0903      Wt Readings from Last 3 Encounters:  07/28/21 171 lb 3.2 oz (77.7 kg)  07/12/21 172 lb (78 kg)  01/11/21 168 lb (76.2 kg)      Other studies Reviewed: Additional studies/ records that were reviewed today include: None Review of the above records demonstrates:  NA.     ASSESSMENT AND PLAN:  CAD:   The patient has no new sypmtoms.  No further cardiovascular testing is indicated.  We will continue with aggressive risk reduction and meds as listed with the exception below.  DYSLIPIDEMIA:   LDL was at 56.  No change in therapy   CARDIOMYOPATHY:    Ejection fraction was improved.  However, I do not think he is tolerating the highest dose of Entresto so we will reduce that to 49/51 twice daily.  If he continues to have lightheadedness and might switch him to once daily beta-blocker in the evening to see if that helps.   CAROTID STENOSIS:  He had 40 - 59% stenosis of the left and mild righ in Sept.    CKD:   Creat was 1.97 in August.  I will try to get outside labs.  If however, cannot get these I will have him come back for BMET.    Current medicines are reviewed at length with the patient today.  The patient does not have concerns regarding medicines.  The following changes have been made:  As above  Labs/ tests ordered today include:   NA  Orders Placed This Encounter  Procedures   EKG 12-Lead    Disposition:   FU with me in 6  months.    Signed, September, MD  07/28/2021 12:22 PM    [facility]

## 2021-07-28 ENCOUNTER — Ambulatory Visit (INDEPENDENT_AMBULATORY_CARE_PROVIDER_SITE_OTHER): Payer: Medicare Other | Admitting: Cardiology

## 2021-07-28 ENCOUNTER — Encounter: Payer: Self-pay | Admitting: Cardiology

## 2021-07-28 VITALS — BP 96/60 | HR 53 | Ht 70.0 in | Wt 171.2 lb

## 2021-07-28 DIAGNOSIS — I25118 Atherosclerotic heart disease of native coronary artery with other forms of angina pectoris: Secondary | ICD-10-CM | POA: Diagnosis not present

## 2021-07-28 DIAGNOSIS — I6529 Occlusion and stenosis of unspecified carotid artery: Secondary | ICD-10-CM | POA: Diagnosis not present

## 2021-07-28 DIAGNOSIS — I420 Dilated cardiomyopathy: Secondary | ICD-10-CM | POA: Diagnosis not present

## 2021-07-28 DIAGNOSIS — N529 Male erectile dysfunction, unspecified: Secondary | ICD-10-CM | POA: Diagnosis not present

## 2021-07-28 MED ORDER — SACUBITRIL-VALSARTAN 49-51 MG PO TABS: 1.0000 | ORAL_TABLET | Freq: Two times a day (BID) | ORAL | 3 refills | Status: DC

## 2021-07-28 NOTE — Patient Instructions (Signed)
Medication Instructions:   DECREASE ENTRESTO TO 49/51 MG TWICE DAILY=1/2 OF THE 97/103 TABLET TWICE DAILY  *If you need a refill on your cardiac medications before your next appointment, please call your pharmacy*   Follow-Up: At [facility], you and your health needs are our priority.  As part of our continuing mission to provide you with exceptional heart care, we have created designated Provider Care Teams.  These Care Teams include your primary Cardiologist (physician) and Advanced Practice Providers (APPs -  Physician Assistants and Nurse Practitioners) who all work together to provide you with the care you need, when you need it.  We recommend signing up for the patient portal called "MyChart".  Sign up information is provided on this After Visit Summary.  MyChart is used to connect with patients for Virtual Visits (Telemedicine).  Patients are able to view lab/test results, encounter notes, upcoming appointments, etc.  Non-urgent messages can be sent to your provider as well.   To learn more about what you can do with MyChart, go to [email or web address].    Your next appointment:   6 month(s)  The format for your next appointment:   In Person  Provider:   Rollene Rotunda, MD      Important Information About Sugar

## 2021-08-17 ENCOUNTER — Encounter: Payer: Self-pay | Admitting: Cardiology

## 2021-08-17 DIAGNOSIS — R5383 Other fatigue: Secondary | ICD-10-CM

## 2021-08-21 ENCOUNTER — Other Ambulatory Visit: Payer: Self-pay

## 2021-08-21 DIAGNOSIS — R972 Elevated prostate specific antigen [PSA]: Secondary | ICD-10-CM

## 2021-08-21 LAB — CBC
Hematocrit: 36.6 % — ABNORMAL LOW (ref 37.5–51.0)
Hemoglobin: 11.9 g/dL — ABNORMAL LOW (ref 13.0–17.7)
MCH: 29.1 pg (ref 26.6–33.0)
MCHC: 32.5 g/dL (ref 31.5–35.7)
MCV: 90 fL (ref 79–97)
Platelets: 189 10*3/uL (ref 150–450)
RBC: 4.09 x10E6/uL — ABNORMAL LOW (ref 4.14–5.80)
RDW: 13.6 % (ref 11.6–15.4)
WBC: 5.3 10*3/uL (ref 3.4–10.8)

## 2021-08-21 LAB — BASIC METABOLIC PANEL
BUN/Creatinine Ratio: 18 (ref 10–24)
BUN: 29 mg/dL — ABNORMAL HIGH (ref 8–27)
CO2: 23 mmol/L (ref 20–29)
Calcium: 9 mg/dL (ref 8.6–10.2)
Chloride: 111 mmol/L — ABNORMAL HIGH (ref 96–106)
Creatinine, Ser: 1.58 mg/dL — ABNORMAL HIGH (ref 0.76–1.27)
Glucose: 99 mg/dL (ref 70–99)
Potassium: 5.5 mmol/L — ABNORMAL HIGH (ref 3.5–5.2)
Sodium: 140 mmol/L (ref 134–144)
eGFR: 46 mL/min/{1.73_m2} — ABNORMAL LOW (ref >59)

## 2021-08-21 LAB — TSH: TSH: 1.29 u[IU]/mL (ref 0.450–4.500)

## 2021-08-22 ENCOUNTER — Other Ambulatory Visit: Payer: Self-pay | Admitting: *Deleted

## 2021-08-22 DIAGNOSIS — E875 Hyperkalemia: Secondary | ICD-10-CM

## 2021-08-26 LAB — BASIC METABOLIC PANEL
BUN/Creatinine Ratio: 14 (ref 10–24)
BUN: 23 mg/dL (ref 8–27)
CO2: 20 mmol/L (ref 20–29)
Calcium: 9 mg/dL (ref 8.6–10.2)
Chloride: 108 mmol/L — ABNORMAL HIGH (ref 96–106)
Creatinine, Ser: 1.59 mg/dL — ABNORMAL HIGH (ref 0.76–1.27)
Glucose: 120 mg/dL — ABNORMAL HIGH (ref 70–99)
Potassium: 5.2 mmol/L (ref 3.5–5.2)
Sodium: 142 mmol/L (ref 134–144)
eGFR: 46 mL/min/{1.73_m2} — ABNORMAL LOW (ref >59)

## 2021-08-28 ENCOUNTER — Telehealth: Payer: Self-pay | Admitting: *Deleted

## 2021-08-28 DIAGNOSIS — N289 Disorder of kidney and ureter, unspecified: Secondary | ICD-10-CM

## 2021-08-28 MED ORDER — SACUBITRIL-VALSARTAN 24-26 MG PO TABS: 1.0000 | ORAL_TABLET | Freq: Two times a day (BID) | ORAL | Status: DC

## 2021-08-28 NOTE — Telephone Encounter (Signed)
Spoke with pt, he reports his blood pressure runs 110-112 most of the time. He is willing to try the entresto again and he will take 1/2 of the 49/51 mg tablets twice daily, he has at home. He is aware he needs lab work in 1 week. He does report he is feeling better off the entresto.

## 2021-08-28 NOTE — Telephone Encounter (Signed)
-----   Message from Rollene Rotunda, MD sent at 08/27/2021  5:22 PM EDT ----- I would like to have him on 24/26 bid Entresto if his SBP is averaging at home above 110 and would then repeat a BMET in one week.  Call Mr. Boord with the results and send results to Lynnda Child, MD

## 2021-08-30 ENCOUNTER — Encounter: Payer: Medicare Other | Admitting: Family Medicine

## 2021-09-04 LAB — BASIC METABOLIC PANEL
BUN/Creatinine Ratio: 16 (ref 10–24)
BUN: 31 mg/dL — ABNORMAL HIGH (ref 8–27)
CO2: 19 mmol/L — ABNORMAL LOW (ref 20–29)
Calcium: 9 mg/dL (ref 8.6–10.2)
Chloride: 106 mmol/L (ref 96–106)
Creatinine, Ser: 1.89 mg/dL — ABNORMAL HIGH (ref 0.76–1.27)
Glucose: 99 mg/dL (ref 70–99)
Potassium: 4.7 mmol/L (ref 3.5–5.2)
Sodium: 136 mmol/L (ref 134–144)
eGFR: 37 mL/min/{1.73_m2} — ABNORMAL LOW (ref >59)

## 2021-09-05 ENCOUNTER — Other Ambulatory Visit: Payer: Self-pay | Admitting: *Deleted

## 2021-09-05 DIAGNOSIS — N289 Disorder of kidney and ureter, unspecified: Secondary | ICD-10-CM

## 2021-09-05 DIAGNOSIS — I509 Heart failure, unspecified: Secondary | ICD-10-CM

## 2021-09-18 DIAGNOSIS — R0602 Shortness of breath: Secondary | ICD-10-CM | POA: Insufficient documentation

## 2021-09-18 DIAGNOSIS — I25118 Atherosclerotic heart disease of native coronary artery with other forms of angina pectoris: Secondary | ICD-10-CM | POA: Insufficient documentation

## 2021-09-18 DIAGNOSIS — J449 Chronic obstructive pulmonary disease, unspecified: Secondary | ICD-10-CM | POA: Insufficient documentation

## 2021-09-18 DIAGNOSIS — E785 Hyperlipidemia, unspecified: Secondary | ICD-10-CM | POA: Insufficient documentation

## 2021-09-18 DIAGNOSIS — I10 Essential (primary) hypertension: Secondary | ICD-10-CM | POA: Insufficient documentation

## 2021-09-19 ENCOUNTER — Ambulatory Visit (INDEPENDENT_AMBULATORY_CARE_PROVIDER_SITE_OTHER): Payer: Medicare Other

## 2021-09-19 VITALS — Wt 171.0 lb

## 2021-09-19 DIAGNOSIS — Z0000 Encounter for general adult medical examination without abnormal findings: Secondary | ICD-10-CM | POA: Diagnosis not present

## 2021-09-19 LAB — BASIC METABOLIC PANEL
BUN/Creatinine Ratio: 17 (ref 10–24)
BUN: 32 mg/dL — ABNORMAL HIGH (ref 8–27)
CO2: 19 mmol/L — ABNORMAL LOW (ref 20–29)
Calcium: 9.2 mg/dL (ref 8.6–10.2)
Chloride: 107 mmol/L — ABNORMAL HIGH (ref 96–106)
Creatinine, Ser: 1.91 mg/dL — ABNORMAL HIGH (ref 0.76–1.27)
Glucose: 103 mg/dL — ABNORMAL HIGH (ref 70–99)
Potassium: 5.1 mmol/L (ref 3.5–5.2)
Sodium: 139 mmol/L (ref 134–144)
eGFR: 37 mL/min/{1.73_m2} — ABNORMAL LOW (ref >59)

## 2021-09-19 NOTE — Patient Instructions (Signed)
Caleb House , Thank you for taking time to come for your Medicare Wellness Visit. I appreciate your ongoing commitment to your health goals. Please review the following plan we discussed and let me know if I can assist you in the future.   Screening recommendations/referrals: Colonoscopy: 10/06/20 Recommended yearly ophthalmology/optometry visit for glaucoma screening and checkup Recommended yearly dental visit for hygiene and checkup  Vaccinations: Influenza vaccine: n/d Pneumococcal vaccine: 09/14/20 Tdap vaccine: 09/21/20 Shingles vaccine: Shingrix 09/21/20, 11/28/20   Covid-19: 02/19/19, 03/13/19, 11/05/19  Advanced directives: no  Conditions/risks identified: none  Next appointment: Follow up in one year for your annual wellness visit. 09/21/22 @ 10:30 am by phone  Preventive Care 65 Years and Older, Male Preventive care refers to lifestyle choices and visits with your health care provider that can promote health and wellness. What does preventive care include? A yearly physical exam. This is also called an annual well check. Dental exams once or twice a year. Routine eye exams. Ask your health care provider how often you should have your eyes checked. Personal lifestyle choices, including: Daily care of your teeth and gums. Regular physical activity. Eating a healthy diet. Avoiding tobacco and drug use. Limiting alcohol use. Practicing safe sex. Taking low doses of aspirin every day. Taking vitamin and mineral supplements as recommended by your health care provider. What happens during an annual well check? The services and screenings done by your health care provider during your annual well check will depend on your age, overall health, lifestyle risk factors, and family history of disease. Counseling  Your health care provider may ask you questions about your: Alcohol use. Tobacco use. Drug use. Emotional well-being. Home and relationship well-being. Sexual  activity. Eating habits. History of falls. Memory and ability to understand (cognition). Work and work Astronomer. Screening  You may have the following tests or measurements: Height, weight, and BMI. Blood pressure. Lipid and cholesterol levels. These may be checked every 5 years, or more frequently if you are over 61 years old. Skin check. Lung cancer screening. You may have this screening every year starting at age 64 if you have a 30-pack-year history of smoking and currently smoke or have quit within the past 15 years. Fecal occult blood test (FOBT) of the stool. You may have this test every year starting at age 38. Flexible sigmoidoscopy or colonoscopy. You may have a sigmoidoscopy every 5 years or a colonoscopy every 10 years starting at age 79. Prostate cancer screening. Recommendations will vary depending on your family history and other risks. Hepatitis C blood test. Hepatitis B blood test. Sexually transmitted disease (STD) testing. Diabetes screening. This is done by checking your blood sugar (glucose) after you have not eaten for a while (fasting). You may have this done every 1-3 years. Abdominal aortic aneurysm (AAA) screening. You may need this if you are a current or former smoker. Osteoporosis. You may be screened starting at age 4 if you are at high risk. Talk with your health care provider about your test results, treatment options, and if necessary, the need for more tests. Vaccines  Your health care provider may recommend certain vaccines, such as: Influenza vaccine. This is recommended every year. Tetanus, diphtheria, and acellular pertussis (Tdap, Td) vaccine. You may need a Td booster every 10 years. Zoster vaccine. You may need this after age 47. Pneumococcal 13-valent conjugate (PCV13) vaccine. One dose is recommended after age 79. Pneumococcal polysaccharide (PPSV23) vaccine. One dose is recommended after age 52. Talk to your  health care provider about which  screenings and vaccines you need and how often you need them. This information is not intended to replace advice given to you by your health care provider. Make sure you discuss any questions you have with your health care provider. Document Released: 02/04/2015 Document Revised: 09/28/2015 Document Reviewed: 11/09/2014 Elsevier Interactive Patient Education  2017 [employer] Prevention in the Home Falls can cause injuries. They can happen to people of all ages. There are many things you can do to make your home safe and to help prevent falls. What can I do on the outside of my home? Regularly fix the edges of walkways and driveways and fix any cracks. Remove anything that might make you trip as you walk through a door, such as a raised step or threshold. Trim any bushes or trees on the path to your home. Use bright outdoor lighting. Clear any walking paths of anything that might make someone trip, such as rocks or tools. Regularly check to see if handrails are loose or broken. Make sure that both sides of any steps have handrails. Any raised decks and porches should have guardrails on the edges. Have any leaves, snow, or ice cleared regularly. Use sand or salt on walking paths during winter. Clean up any spills in your garage right away. This includes oil or grease spills. What can I do in the bathroom? Use night lights. Install grab bars by the toilet and in the tub and shower. Do not use towel bars as grab bars. Use non-skid mats or decals in the tub or shower. If you need to sit down in the shower, use a plastic, non-slip stool. Keep the floor dry. Clean up any water that spills on the floor as soon as it happens. Remove soap buildup in the tub or shower regularly. Attach bath mats securely with double-sided non-slip rug tape. Do not have throw rugs and other things on the floor that can make you trip. What can I do in the bedroom? Use night lights. Make sure that you have a  light by your bed that is easy to reach. Do not use any sheets or blankets that are too big for your bed. They should not hang down onto the floor. Have a firm chair that has side arms. You can use this for support while you get dressed. Do not have throw rugs and other things on the floor that can make you trip. What can I do in the kitchen? Clean up any spills right away. Avoid walking on wet floors. Keep items that you use a lot in easy-to-reach places. If you need to reach something above you, use a strong step stool that has a grab bar. Keep electrical cords out of the way. Do not use floor polish or wax that makes floors slippery. If you must use wax, use non-skid floor wax. Do not have throw rugs and other things on the floor that can make you trip. What can I do with my stairs? Do not leave any items on the stairs. Make sure that there are handrails on both sides of the stairs and use them. Fix handrails that are broken or loose. Make sure that handrails are as long as the stairways. Check any carpeting to make sure that it is firmly attached to the stairs. Fix any carpet that is loose or worn. Avoid having throw rugs at the top or bottom of the stairs. If you do have throw rugs, attach them  to the floor with carpet tape. Make sure that you have a light switch at the top of the stairs and the bottom of the stairs. If you do not have them, ask someone to add them for you. What else can I do to help prevent falls? Wear shoes that: Do not have high heels. Have rubber bottoms. Are comfortable and fit you well. Are closed at the toe. Do not wear sandals. If you use a stepladder: Make sure that it is fully opened. Do not climb a closed stepladder. Make sure that both sides of the stepladder are locked into place. Ask someone to hold it for you, if possible. Clearly mark and make sure that you can see: Any grab bars or handrails. First and last steps. Where the edge of each step  is. Use tools that help you move around (mobility aids) if they are needed. These include: Canes. Walkers. Scooters. Crutches. Turn on the lights when you go into a dark area. Replace any light bulbs as soon as they burn out. Set up your furniture so you have a clear path. Avoid moving your furniture around. If any of your floors are uneven, fix them. If there are any pets around you, be aware of where they are. Review your medicines with your doctor. Some medicines can make you feel dizzy. This can increase your chance of falling. Ask your doctor what other things that you can do to help prevent falls. This information is not intended to replace advice given to you by your health care provider. Make sure you discuss any questions you have with your health care provider. Document Released: 11/04/2008 Document Revised: 06/16/2015 Document Reviewed: 02/12/2014 Elsevier Interactive Patient Education  2017 [employer].

## 2021-09-19 NOTE — Progress Notes (Signed)
Virtual Visit via Telephone Note  I connected with  Caleb House on 09/19/21 at 10:00 AM EDT by telephone and verified that I am speaking with the correct person using two identifiers.  Location: Patient: home Provider: [facility] Persons participating in the virtual visit: patient/Nurse Health Advisor   I discussed the limitations, risks, security and privacy concerns of performing an evaluation and management service by telephone and the availability of in person appointments. The patient expressed understanding and agreed to proceed.  Interactive audio and video telecommunications were attempted between this nurse and patient, however failed, due to patient having technical difficulties OR patient did not have access to video capability.  We continued and completed visit with audio only.  Some vital signs may be absent or patient reported.   Hal Hope, LPN  Subjective:   Caleb House is a 71 y.o. male who presents for Medicare Annual/Subsequent preventive examination.  Review of Systems     Cardiac Risk Factors include: advanced age (>44men, >52 women);dyslipidemia;hypertension;male gender;smoking/ tobacco exposure     Objective:    Today's Vitals   09/19/21 1007  Weight: 171 lb (77.6 kg)   Body mass index is 24.54 kg/m.     09/19/2021    9:58 AM 10/06/2020    7:21 AM 09/14/2020    8:29 AM  Advanced Directives  Does Patient Have a Medical Advance Directive? No Yes Yes  Type of Special educational needs teacher of [place];Living will Healthcare Power of [place];Living will  Does patient want to make changes to medical advance directive?   No - Patient declined  Copy of Healthcare Power of Attorney in Chart?  No - copy requested No - copy requested  Would patient like information on creating a medical advance directive? No - Patient declined      Current Medications (verified) Outpatient Encounter Medications as of 09/19/2021  Medication Sig    acetaminophen (TYLENOL) 500 MG tablet Take 500 mg by mouth every 6 (six) hours as needed.   aspirin EC 81 MG tablet Take 81 mg by mouth daily. Swallow whole.   Calcium Carbonate Antacid (ALKA-SELTZER ANTACID PO) Take by mouth.   carvedilol (COREG) 6.25 MG tablet TAKE 1 TABLET BY MOUTH TWICE A DAY   clopidogrel (PLAVIX) 75 MG tablet Take 1 tablet (75 mg total) by mouth daily.   Multiple Vitamins-Minerals (IMMUNE SUPPORT PO) Take by mouth.   rosuvastatin (CRESTOR) 40 MG tablet Take 40 mg by mouth daily.   sacubitril-valsartan (ENTRESTO) 24-26 MG Take 1 tablet by mouth 2 (two) times daily.   sildenafil (REVATIO) 20 MG tablet Take 1 tablet (20 mg total) by mouth as needed. 3-5 tablets as needed prior to intercourse   No facility-administered encounter medications on file as of 09/19/2021.    Allergies (verified) Brilinta [ticagrelor] and Spironolactone   History: Past Medical History:  Diagnosis Date   CAD (coronary artery disease)    CHF (congestive heart failure) (HCC)    HTN (hypertension)    Past Surgical History:  Procedure Laterality Date   CARDIAC CATHETERIZATION     7 stents placed   COLONOSCOPY WITH PROPOFOL N/A 10/06/2020   Procedure: COLONOSCOPY WITH PROPOFOL;  Surgeon: Wyline Mood, MD;  Location: [facility] ENDOSCOPY;  Service: Gastroenterology;  Laterality: N/A;   KNEE SURGERY     Right knee   TONSILLECTOMY     Family History  Problem Relation Age of Onset   Depression Mother    Alcohol abuse Mother    Suicidality Mother  Heart disease Father        No details   Heart disease Brother        No details   Suicidality Maternal Grandfather    Heart attack Paternal Grandfather 29   Social History   Socioeconomic History   Marital status: Divorced    Spouse name: Not on file   Number of children: 1   Years of education: college   Highest education level: Not on file  Occupational History   Not on file  Tobacco Use   Smoking status: Former    Packs/day: 0.75     Years: 40.00    Total pack years: 30.00    Types: Cigarettes    Quit date: 08/23/2018    Years since quitting: 3.0   Smokeless tobacco: Never  Vaping Use   Vaping Use: Never used  Substance and Sexual Activity   Alcohol use: Not Currently    Comment: quit drinking 9 years ago   Drug use: Never   Sexual activity: Not Currently  Other Topics Concern   Not on file  Social History Narrative   09/02/19   From: [place], moved from [place] most recently   Living: alone   Work: retired - Emergency planning/management officer      Family: Daughter - Engineer, site - lives nearby, 2 grandchildren and 1 great-grandson      Enjoys: putter      Exercise: total gym - 1 hour 5 days a week - strength training    Diet: good overall       Safety   Seat belts: Yes    Guns: Yes  and secure   Safe in relationships: Yes    Social Determinants of Corporate investment banker Strain: Low Risk  (09/19/2021)   Overall Financial Resource Strain (CARDIA)    Difficulty of Paying Living Expenses: Not hard at all  Food Insecurity: No Food Insecurity (09/19/2021)   Hunger Vital Sign    Worried About Running Out of Food in the Last Year: Never true    Ran Out of Food in the Last Year: Never true  Transportation Needs: No Transportation Needs (09/19/2021)   PRAPARE - Administrator, Civil Service (Medical): No    Lack of Transportation (Non-Medical): No  Physical Activity: Sufficiently Active (09/19/2021)   Exercise Vital Sign    Days of Exercise per Week: 5 days    Minutes of Exercise per Session: 50 min  Stress: No Stress Concern Present (09/19/2021)   [employer] of Occupational Health - Occupational Stress Questionnaire    Feeling of Stress : Not at all  Social Connections: Moderately Isolated (09/19/2021)   Social Connection and Isolation Panel [NHANES]    Frequency of Communication with Friends and Family: More than three times a week    Frequency of Social Gatherings with Friends and Family: More than three  times a week    Attends Religious Services: Never    Database administrator or Organizations: Yes    Attends Engineer, structural: More than 4 times per year    Marital Status: Divorced    Tobacco Counseling Counseling given: Not Answered   Clinical Intake:  Pre-visit preparation completed: Yes  Pain : No/denies pain     Nutritional Risks: None Diabetes: No  How often do you need to have someone help you when you read instructions, pamphlets, or other written materials from your doctor or pharmacy?: 1 - Never  Diabetic?no  Interpreter Needed?:  No  Information entered by :: Kennedy Bucker, LPN   Activities of Daily Living    09/19/2021   10:01 AM 09/13/2021    4:01 PM  In your present state of health, do you have any difficulty performing the following activities:  Hearing? 0 0  Vision? 0 0  Difficulty concentrating or making decisions? 0 0  Walking or climbing stairs? 0 0  Dressing or bathing? 0 0  Doing errands, shopping? 0 0  Preparing Food and eating ? N N  Using the Toilet? N N  In the past six months, have you accidently leaked urine? N N  Do you have problems with loss of bowel control? N N  Managing your Medications? N N  Managing your Finances? N N  Housekeeping or managing your Housekeeping? N N    Patient Care Team: Lynnda Child, MD as PCP - General (Family Medicine) Rollene Rotunda, MD as PCP - Cardiology (Cardiology)  Indicate any recent Medical Services you may have received from other than [facility] providers in the past year (date may be approximate).     Assessment:   This is a routine wellness examination for Caleb House.  Hearing/Vision screen Hearing Screening - Comments:: No aids Vision Screening - Comments:: Wears glasses- [employer]  Dietary issues and exercise activities discussed: Current Exercise Habits: Home exercise routine, Type of exercise: walking, Time (Minutes): 50, Frequency (Times/Week): 5, Weekly Exercise  (Minutes/Week): 250, Intensity: Mild   Goals Addressed             This Visit's Progress    DIET - EAT MORE FRUITS AND VEGETABLES         Depression Screen    09/19/2021    9:57 AM 09/14/2020    9:00 AM 09/14/2020    8:30 AM  PHQ 2/9 Scores  PHQ - 2 Score 0 0 0  PHQ- 9 Score 0      Fall Risk    09/19/2021   10:00 AM 09/13/2021    4:01 PM 09/14/2020    8:17 AM  Fall Risk   Falls in the past year? 0 0 0  Number falls in past yr: 0  0  Injury with Fall? 0    Risk for fall due to : No Fall Risks    Follow up Falls prevention discussed      FALL RISK PREVENTION PERTAINING TO THE HOME:  Any stairs in or around the home? No  If so, are there any without handrails? No  Home free of loose throw rugs in walkways, pet beds, electrical cords, etc? Yes  Adequate lighting in your home to reduce risk of falls? Yes   ASSISTIVE DEVICES UTILIZED TO PREVENT FALLS:  Life alert? No  Use of a cane, walker or w/c? No  Grab bars in the bathroom? No  Shower chair or bench in shower? Yes  Elevated toilet seat or a handicapped toilet? No   Cognitive Function:        09/19/2021   10:08 AM  6CIT Screen  What Year? 0 points  What month? 0 points  What time? 0 points  Count back from 20 0 points  Months in reverse 0 points  Repeat phrase 0 points  Total Score 0 points    Immunizations Immunization History  Administered Date(s) Administered   PFIZER(Purple Top)SARS-COV-2 Vaccination 02/19/2019, 03/13/2019, 11/05/2019   PNEUMOCOCCAL CONJUGATE-20 09/14/2020   Tdap 09/21/2020   Zoster Recombinat (Shingrix) 09/21/2020, 11/28/2020    TDAP status: Up to  date  Flu Vaccine status: Declined, Education has been provided regarding the importance of this vaccine but patient still declined. Advised may receive this vaccine at local pharmacy or Health Dept. Aware to provide a copy of the vaccination record if obtained from local pharmacy or Health Dept. Verbalized acceptance and  understanding.  Pneumococcal vaccine status: Up to date  Covid-19 vaccine status: Completed vaccines  Qualifies for Shingles Vaccine? Yes   Zostavax completed No   Shingrix Completed?: Yes  Screening Tests Health Maintenance  Topic Date Due   COVID-19 Vaccine (4 - Pfizer risk series) 12/31/2019   INFLUENZA VACCINE  08/22/2021   TETANUS/TDAP  09/22/2030   COLONOSCOPY (Pts 45-83yrs Insurance coverage will need to be confirmed)  10/07/2030   Pneumonia Vaccine 40+ Years old  Completed   Hepatitis C Screening  Completed   Zoster Vaccines- Shingrix  Completed   HPV VACCINES  Aged Out    Health Maintenance  Health Maintenance Due  Topic Date Due   COVID-19 Vaccine (4 - Pfizer risk series) 12/31/2019   INFLUENZA VACCINE  08/22/2021    Colorectal cancer screening: Type of screening: Colonoscopy. Completed 10/06/20. Repeat every 10 years  Lung Cancer Screening: (Low Dose CT Chest recommended if Age 38-80 years, 30 pack-year currently smoking OR have quit w/in 15years.) does qualify.   Lung Cancer Screening Referral: had one last year  Additional Screening:  Hepatitis C Screening: does qualify; Completed 10/22/05  Vision Screening: Recommended annual ophthalmology exams for early detection of glaucoma and other disorders of the eye. Is the patient up to date with their annual eye exam?  Yes  Who is the provider or what is the name of the office in which the patient attends annual eye exams? [facility] If pt is not established with a provider, would they like to be referred to a provider to establish care? No .   Dental Screening: Recommended annual dental exams for proper oral hygiene  Community Resource Referral / Chronic Care Management: CRR required this visit?  No   CCM required this visit?  No      Plan:     I have personally reviewed and noted the following in the patient's chart:   Medical and social history Use of alcohol, tobacco or illicit drugs   Current medications and supplements including opioid prescriptions. Patient is not currently taking opioid prescriptions. Functional ability and status Nutritional status Physical activity Advanced directives List of other physicians Hospitalizations, surgeries, and ER visits in previous 12 months Vitals Screenings to include cognitive, depression, and falls Referrals and appointments  In addition, I have reviewed and discussed with patient certain preventive protocols, quality metrics, and best practice recommendations. A written personalized care plan for preventive services as well as general preventive health recommendations were provided to patient.     Hal Hope, LPN   0/09/3816   Nurse Notes: none

## 2021-09-20 ENCOUNTER — Other Ambulatory Visit: Payer: Self-pay | Admitting: *Deleted

## 2021-09-20 ENCOUNTER — Other Ambulatory Visit: Payer: Self-pay | Admitting: Cardiology

## 2021-09-20 ENCOUNTER — Encounter: Payer: Self-pay | Admitting: Cardiology

## 2021-09-20 DIAGNOSIS — N289 Disorder of kidney and ureter, unspecified: Secondary | ICD-10-CM

## 2021-09-20 MED ORDER — CLOPIDOGREL BISULFATE 75 MG PO TABS: 75.0000 mg | ORAL_TABLET | Freq: Every day | ORAL | 3 refills | Status: DC

## 2021-09-22 ENCOUNTER — Ambulatory Visit (INDEPENDENT_AMBULATORY_CARE_PROVIDER_SITE_OTHER): Payer: Medicare Other | Admitting: Family Medicine

## 2021-09-22 ENCOUNTER — Encounter: Payer: Self-pay | Admitting: Family Medicine

## 2021-09-22 ENCOUNTER — Other Ambulatory Visit: Payer: Medicare Other

## 2021-09-22 VITALS — BP 138/82 | HR 50 | Temp 97.2°F | Ht 71.0 in | Wt 175.8 lb

## 2021-09-22 DIAGNOSIS — N529 Male erectile dysfunction, unspecified: Secondary | ICD-10-CM | POA: Diagnosis not present

## 2021-09-22 DIAGNOSIS — I25118 Atherosclerotic heart disease of native coronary artery with other forms of angina pectoris: Secondary | ICD-10-CM

## 2021-09-22 DIAGNOSIS — J449 Chronic obstructive pulmonary disease, unspecified: Secondary | ICD-10-CM

## 2021-09-22 DIAGNOSIS — N1832 Chronic kidney disease, stage 3b: Secondary | ICD-10-CM | POA: Diagnosis not present

## 2021-09-22 DIAGNOSIS — N183 Chronic kidney disease, stage 3 unspecified: Secondary | ICD-10-CM | POA: Insufficient documentation

## 2021-09-22 DIAGNOSIS — I10 Essential (primary) hypertension: Secondary | ICD-10-CM

## 2021-09-22 DIAGNOSIS — Z1211 Encounter for screening for malignant neoplasm of colon: Secondary | ICD-10-CM

## 2021-09-22 DIAGNOSIS — E782 Mixed hyperlipidemia: Secondary | ICD-10-CM

## 2021-09-22 DIAGNOSIS — J439 Emphysema, unspecified: Secondary | ICD-10-CM

## 2021-09-22 LAB — LIPID PANEL
Cholesterol: 104 mg/dL (ref 0–200)
HDL: 46.1 mg/dL
LDL Cholesterol: 45 mg/dL (ref 0–99)
NonHDL: 57.52
Total CHOL/HDL Ratio: 2
Triglycerides: 61 mg/dL (ref 0.0–149.0)
VLDL: 12.2 mg/dL (ref 0.0–40.0)

## 2021-09-22 MED ORDER — SILDENAFIL CITRATE 100 MG PO TABS: 100.0000 mg | ORAL_TABLET | Freq: Every day | ORAL | 1 refills | Status: DC | PRN

## 2021-09-22 NOTE — Progress Notes (Signed)
Subjective:     Caleb House is a 71 y.o. male presenting for Medicare Wellness     HPI  Overall feeling well Working with cardiology on potassium  Up to date with dentist    Review of Systems  Constitutional:  Negative for chills and fever.  HENT:  Negative for congestion and sore throat.   Respiratory:  Negative for shortness of breath.   Cardiovascular:  Negative for chest pain.  Gastrointestinal:  Negative for nausea and vomiting.  Genitourinary: Negative.   Musculoskeletal: Negative.  Negative for myalgias.  Skin:  Negative for rash.  Neurological:  Negative for dizziness and headaches.  Hematological:  Does not bruise/bleed easily.  Psychiatric/Behavioral:  The patient is not nervous/anxious.      Social History   Tobacco Use  Smoking Status Former   Packs/day: 0.75   Years: 40.00   Total pack years: 30.00   Types: Cigarettes   Quit date: 08/23/2018   Years since quitting: 3.0  Smokeless Tobacco Never        Objective:    BP Readings from Last 3 Encounters:  09/22/21 138/82  07/28/21 96/60  07/12/21 100/63   Wt Readings from Last 3 Encounters:  09/22/21 175 lb 12.8 oz (79.7 kg)  09/19/21 171 lb (77.6 kg)  07/28/21 171 lb 3.2 oz (77.7 kg)    BP 138/82 (BP Location: Left Arm, Patient Position: Sitting, Cuff Size: Normal)   Pulse (!) 50   Temp (!) 97.2 F (36.2 C) (Temporal)   Ht 5\' 11"  (1.803 m)   Wt 175 lb 12.8 oz (79.7 kg)   SpO2 100%   BMI 24.52 kg/m    Physical Exam Constitutional:      General: He is not in acute distress.    Appearance: He is well-developed. He is not diaphoretic.  HENT:     Head: Normocephalic and atraumatic.     Right Ear: Tympanic membrane and ear canal normal.     Left Ear: Tympanic membrane and ear canal normal.     Nose: Nose normal.     Mouth/Throat:     Pharynx: Uvula midline.  Eyes:     General: No scleral icterus.    Conjunctiva/sclera: Conjunctivae normal.     Pupils: Pupils are equal, round,  and reactive to light.  Cardiovascular:     Rate and Rhythm: Normal rate and regular rhythm.     Heart sounds: Normal heart sounds. No murmur heard. Pulmonary:     Effort: Pulmonary effort is normal. No respiratory distress.     Breath sounds: Normal breath sounds. No wheezing.  Abdominal:     General: Bowel sounds are normal. There is no distension.     Palpations: Abdomen is soft. There is no mass.     Tenderness: There is no abdominal tenderness. There is no guarding.  Musculoskeletal:        General: Normal range of motion.     Cervical back: Normal range of motion and neck supple.  Lymphadenopathy:     Cervical: No cervical adenopathy.  Skin:    General: Skin is warm and dry.     Capillary Refill: Capillary refill takes less than 2 seconds.  Neurological:     Mental Status: He is alert and oriented to person, place, and time.           Assessment & Plan:   Problem List Items Addressed This Visit       Cardiovascular and Mediastinum   Essential hypertension  Controlled. Follows with cardiology. Appreciate support. Cont entrestoro 24-26 mg. Carvedilol 6.25 mg      Relevant Medications   sildenafil (VIAGRA) 100 MG tablet   Coronary artery disease of native artery of native heart with stable angina pectoris (HCC)    Stable. No chest pain. S/p stent. On plavix 75 mg and asa 81 mg and crestor 40 mg      Relevant Medications   sildenafil (VIAGRA) 100 MG tablet     Respiratory   COPD (chronic obstructive pulmonary disease) (HCC)    Breathing well. No inhaler needs.       Emphysema lung (HCC)    Stable, not currently smoking. Following with lung cancer screening group.         Genitourinary   CKD (chronic kidney disease) stage 3, GFR 30-59 ml/min (HCC)    Stable. Cont valsartan.         Other   Hyperlipidemia    Labs today. Cont crestor 40 mg      Relevant Medications   sildenafil (VIAGRA) 100 MG tablet   Other Relevant Orders   Lipid panel    Erectile dysfunction - Primary    Taking sildenafil 100 mg for ED with good response. No cp. Understands risk. [facility].       Relevant Medications   sildenafil (VIAGRA) 100 MG tablet     Return in about 6 months (around 03/23/2022) for TOC with [employer].  Lynnda Child, MD

## 2021-09-22 NOTE — Assessment & Plan Note (Signed)
Taking sildenafil 100 mg for ED with good response. No cp. Understands risk. [facility] use [facility].

## 2021-09-22 NOTE — Assessment & Plan Note (Signed)
Breathing well. No inhaler needs.

## 2021-09-22 NOTE — Assessment & Plan Note (Signed)
Controlled. Follows with cardiology. Appreciate support. Cont entrestoro 24-26 mg. Carvedilol 6.25 mg

## 2021-09-22 NOTE — Assessment & Plan Note (Signed)
Stable, not currently smoking. Following with lung cancer screening group.

## 2021-09-22 NOTE — Addendum Note (Signed)
Addended by: Alvina Chou on: 09/22/2021 02:15 PM   Modules accepted: Orders

## 2021-09-22 NOTE — Patient Instructions (Signed)
Try the change in sildenafil to 100 mg   Cholesterol check today

## 2021-09-22 NOTE — Assessment & Plan Note (Signed)
Stable. No chest pain. S/p stent. On plavix 75 mg and asa 81 mg and crestor 40 mg

## 2021-09-22 NOTE — Assessment & Plan Note (Signed)
Stable. Cont valsartan.

## 2021-09-22 NOTE — Assessment & Plan Note (Signed)
Labs today. Cont crestor 40 mg

## 2021-09-26 LAB — FECAL OCCULT BLOOD, IMMUNOCHEMICAL: Fecal Occult Bld: NEGATIVE

## 2021-09-27 ENCOUNTER — Encounter: Payer: Self-pay | Admitting: Cardiology

## 2021-10-04 ENCOUNTER — Ambulatory Visit (HOSPITAL_COMMUNITY)
Admission: RE | Admit: 2021-10-04 | Discharge: 2021-10-04 | Disposition: A | Discharge status: Home / Self Care | Payer: Medicare Other | Source: Ambulatory Visit | Attending: Cardiovascular Disease | Admitting: Cardiovascular Disease

## 2021-10-04 DIAGNOSIS — I6523 Occlusion and stenosis of bilateral carotid arteries: Secondary | ICD-10-CM | POA: Diagnosis present

## 2021-10-21 LAB — BASIC METABOLIC PANEL
BUN/Creatinine Ratio: 16 (ref 10–24)
BUN: 30 mg/dL — ABNORMAL HIGH (ref 8–27)
CO2: 24 mmol/L (ref 20–29)
Calcium: 9.8 mg/dL (ref 8.6–10.2)
Chloride: 104 mmol/L (ref 96–106)
Creatinine, Ser: 1.86 mg/dL — ABNORMAL HIGH (ref 0.76–1.27)
Glucose: 122 mg/dL — ABNORMAL HIGH (ref 70–99)
Potassium: 5.2 mmol/L (ref 3.5–5.2)
Sodium: 140 mmol/L (ref 134–144)
eGFR: 38 mL/min/{1.73_m2} — ABNORMAL LOW (ref >59)

## 2021-11-14 ENCOUNTER — Encounter: Payer: Self-pay | Admitting: Cardiology

## 2021-11-16 ENCOUNTER — Telehealth: Payer: Self-pay | Admitting: Cardiology

## 2021-11-16 MED ORDER — ROSUVASTATIN CALCIUM 40 MG PO TABS: 40.0000 mg | ORAL_TABLET | Freq: Every day | ORAL | 3 refills | Status: DC

## 2021-11-16 NOTE — Telephone Encounter (Signed)
*  STAT* If patient is at the pharmacy, call can be transferred to refill team.   1. Which medications need to be refilled? (please list name of each medication and dose if known)   rosuvastatin (CRESTOR) 40 MG tablet  2. Which pharmacy/location (including street and city if local pharmacy) is medication to be sent to?  [id number] [address], [place]  3. Do they need a 30 day or 90 day supply? 90 day  Patient stated he is completely out of this medication.

## 2021-12-07 ENCOUNTER — Ambulatory Visit
Admission: RE | Admit: 2021-12-07 | Discharge: 2021-12-07 | Disposition: A | Discharge status: Home / Self Care | Payer: Medicare Other | Source: Ambulatory Visit | Attending: Acute Care | Admitting: Acute Care

## 2021-12-07 DIAGNOSIS — I700 Atherosclerosis of aorta: Secondary | ICD-10-CM | POA: Insufficient documentation

## 2021-12-07 DIAGNOSIS — J439 Emphysema, unspecified: Secondary | ICD-10-CM | POA: Insufficient documentation

## 2021-12-07 DIAGNOSIS — I2510 Atherosclerotic heart disease of native coronary artery without angina pectoris: Secondary | ICD-10-CM | POA: Diagnosis not present

## 2021-12-07 DIAGNOSIS — Z122 Encounter for screening for malignant neoplasm of respiratory organs: Secondary | ICD-10-CM | POA: Insufficient documentation

## 2021-12-07 DIAGNOSIS — Z87891 Personal history of nicotine dependence: Secondary | ICD-10-CM | POA: Diagnosis present

## 2021-12-11 ENCOUNTER — Other Ambulatory Visit: Payer: Self-pay

## 2021-12-11 DIAGNOSIS — Z122 Encounter for screening for malignant neoplasm of respiratory organs: Secondary | ICD-10-CM

## 2021-12-11 DIAGNOSIS — Z87891 Personal history of nicotine dependence: Secondary | ICD-10-CM

## 2022-01-05 ENCOUNTER — Other Ambulatory Visit: Payer: Self-pay | Admitting: Cardiology

## 2022-01-22 HISTORY — PX: OTHER SURGICAL HISTORY: SHX169

## 2022-01-27 NOTE — Progress Notes (Unsigned)
Cardiology Office Note   Date:  01/29/2022   ID:  Caleb House, DOB 1950/05/08, MRN [id number]  PCP:  Waunita Schooner, MD  Cardiologist:   Minus Breeding, MD Referring:  Waunita Schooner, MD   Chief Complaint  Patient presents with   Cardiomyopathy     History of Present Illness: Caleb House is a 72 y.o. male who is referred by Waunita Schooner, MD for evaluation of CAD.  He came for [facility].   He has had severe coronary artery disease.  He had LAD and RCA chronic total obstruction and high-grade disease in his circumflex treated with PCI on September 09, 2018. He was turned down for bypass because of his severe COPD.  His ejection fraction in July 2020 was 38%.  He did have PET scanning that demonstrated moderate sized full-thickness defect in the mid to distal anterior wall and apex.  He had viable anterior and lateral wall.  He had LAD CTO revascularization.  He had ADR/Sting-Ray on October 23, 2018.   He had staged RCA CTO revascularization on January 01, 2019.  Of note he could not tolerate Brilinta secondary to shortness of breath.  His ejection fraction was 38%.   At the last visit I tried to start spironolactone but he had hyperkalemia.  His EF was 40 - 45% on echo in May of 2022.     I did try to titrate his Entresto but he did not tolerate this so he had a back down.  He was having lightheadedness.  Since I last saw him he has done much better.  He had much less lightheadedness.  He walks routinely 5 days a week 30 minutes at a time.  The patient denies any new symptoms such as chest discomfort, neck or arm discomfort. There has been no new shortness of breath, PND or orthopnea. There have been no reported palpitations, presyncope or syncope.  Past Medical History:  Diagnosis Date   CAD (coronary artery disease)    CHF (congestive heart failure) (HCC)    HTN (hypertension)     Past Surgical History:  Procedure Laterality Date   CARDIAC CATHETERIZATION     7 stents placed    COLONOSCOPY WITH PROPOFOL N/A 10/06/2020   Procedure: COLONOSCOPY WITH PROPOFOL;  Surgeon: Jonathon Bellows, MD;  Location: [facility] ENDOSCOPY;  Service: Gastroenterology;  Laterality: N/A;   KNEE SURGERY     Right knee   TONSILLECTOMY       Current Outpatient Medications  Medication Sig Dispense Refill   acetaminophen (TYLENOL) 500 MG tablet Take 500 mg by mouth every 6 (six) hours as needed.     aspirin EC 81 MG tablet Take 81 mg by mouth daily. Swallow whole.     Calcium Carbonate Antacid (ALKA-SELTZER ANTACID PO) Take by mouth.     carvedilol (COREG) 6.25 MG tablet TAKE 1 TABLET BY MOUTH TWICE A DAY 180 tablet 2   clopidogrel (PLAVIX) 75 MG tablet Take 1 tablet (75 mg total) by mouth daily. 90 tablet 3   Multiple Vitamins-Minerals (IMMUNE SUPPORT PO) Take by mouth.     rosuvastatin (CRESTOR) 40 MG tablet Take 1 tablet (40 mg total) by mouth daily. 90 tablet 3   sacubitril-valsartan (ENTRESTO) 24-26 MG Take 1 tablet by mouth 2 (two) times daily. 60 tablet    sildenafil (VIAGRA) 100 MG tablet Take 1 tablet (100 mg total) by mouth daily as needed for erectile dysfunction. 30 tablet 1   No current facility-administered medications for this visit.  Allergies:   Brilinta [ticagrelor] and Spironolactone    ROS:  Please see the history of present illness.   Otherwise, review of systems are positive for none.   All other systems are reviewed and negative.    PHYSICAL EXAM: VS:  BP 122/60   Pulse 63   Ht 5\' 10"  (1.778 m)   Wt 174 lb 3.2 oz (79 kg)   SpO2 99%   BMI 25.00 kg/m  , BMI Body mass index is 25 kg/m. GENERAL:  Well appearing NECK:  No jugular venous distention, waveform within normal limits, carotid upstroke brisk and symmetric, no bruits, no thyromegaly LUNGS:  Clear to auscultation bilaterally CHEST:  Unremarkable HEART:  PMI not displaced or sustained,S1 and S2 within normal limits, no S3, no S4, no clicks, no rubs, no murmurs ABD:  Flat, positive bowel sounds normal in  frequency in pitch, no bruits, no rebound, no guarding, no midline pulsatile mass, no hepatomegaly, no splenomegaly EXT:  2 plus pulses throughout, no edema, no cyanosis no clubbing   EKG:  EKG is ordered today.  Recent Labs: 08/21/2021: Hemoglobin 11.9; Platelets 189; TSH 1.290 10/20/2021: BUN 30; Creatinine, Ser 1.86; Potassium 5.2; Sodium 140    Lipid Panel    Component Value Date/Time   CHOL 104 09/22/2021 0928   TRIG 61.0 09/22/2021 0928   HDL 46.10 09/22/2021 0928   CHOLHDL 2 09/22/2021 0928   VLDL 12.2 09/22/2021 0928   LDLCALC 45 09/22/2021 0928      Wt Readings from Last 3 Encounters:  01/29/22 174 lb 3.2 oz (79 kg)  09/22/21 175 lb 12.8 oz (79.7 kg)  09/19/21 171 lb (77.6 kg)      Other studies Reviewed: Additional studies/ records that were reviewed today include: Labs Review of the above records demonstrates:   See elsewhere.     ASSESSMENT AND PLAN:  CAD:   The patient has no new sypmtoms.  No further cardiovascular testing is indicated.  We will continue with aggressive risk reduction and meds as listed.  DYSLIPIDEMIA:   LDL was 45.  No change in therapy.    CARDIOMYOPATHY:    Ejection fraction was 40 - 45%.  He does not tolerate med titration.   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   CKD:   Creat was 1.86 which is near his baseline.  He runs from about 1.5-2.0 but I will check a basic metabolic profile today.   Current medicines are reviewed at length with the patient today.  The patient does not have concerns regarding medicines.  The following changes have been made:  None  Labs/ tests ordered today include:     Orders Placed This Encounter  Procedures   Basic Metabolic Panel (BMET)    Disposition:   FU with me in 6 months.    Signed, October, MD  01/29/2022 11:10 AM    [facility]

## 2022-01-29 ENCOUNTER — Encounter: Payer: Self-pay | Admitting: Cardiology

## 2022-01-29 ENCOUNTER — Ambulatory Visit: Payer: Medicare Other | Attending: Cardiology | Admitting: Cardiology

## 2022-01-29 VITALS — BP 122/60 | HR 63 | Ht 70.0 in | Wt 174.2 lb

## 2022-01-29 DIAGNOSIS — E785 Hyperlipidemia, unspecified: Secondary | ICD-10-CM | POA: Diagnosis present

## 2022-01-29 DIAGNOSIS — I6529 Occlusion and stenosis of unspecified carotid artery: Secondary | ICD-10-CM | POA: Insufficient documentation

## 2022-01-29 DIAGNOSIS — I25118 Atherosclerotic heart disease of native coronary artery with other forms of angina pectoris: Secondary | ICD-10-CM | POA: Insufficient documentation

## 2022-01-29 DIAGNOSIS — I255 Ischemic cardiomyopathy: Secondary | ICD-10-CM | POA: Diagnosis present

## 2022-01-29 LAB — BASIC METABOLIC PANEL
BUN/Creatinine Ratio: 18 (ref 10–24)
BUN: 30 mg/dL — ABNORMAL HIGH (ref 8–27)
CO2: 20 mmol/L (ref 20–29)
Calcium: 9.2 mg/dL (ref 8.6–10.2)
Chloride: 108 mmol/L — ABNORMAL HIGH (ref 96–106)
Creatinine, Ser: 1.63 mg/dL — ABNORMAL HIGH (ref 0.76–1.27)
Glucose: 101 mg/dL — ABNORMAL HIGH (ref 70–99)
Potassium: 4.5 mmol/L (ref 3.5–5.2)
Sodium: 140 mmol/L (ref 134–144)
eGFR: 44 mL/min/{1.73_m2} — ABNORMAL LOW (ref >59)

## 2022-01-29 NOTE — Patient Instructions (Signed)
   Follow-Up: At  [facility], you and your health needs are our priority.  As part of our continuing mission to provide you with exceptional heart care, we have created designated Provider Care Teams.  These Care Teams include your primary Cardiologist (physician) and Advanced Practice Providers (APPs -  Physician Assistants and Nurse Practitioners) who all work together to provide you with the care you need, when you need it.  We recommend signing up for the patient portal called "MyChart".  Sign up information is provided on this After Visit Summary.  MyChart is used to connect with patients for Virtual Visits (Telemedicine).  Patients are able to view lab/test results, encounter notes, upcoming appointments, etc.  Non-urgent messages can be sent to your provider as well.   To learn more about what you can do with MyChart, go to [email or web address].    Your next appointment:   6 month(s)  The format for your next appointment:   In Person  Provider:   James Hochrein, MD           

## 2022-01-30 ENCOUNTER — Other Ambulatory Visit: Payer: Self-pay | Admitting: Cardiology

## 2022-01-30 MED ORDER — SACUBITRIL-VALSARTAN 24-26 MG PO TABS: 1.0000 | ORAL_TABLET | Freq: Two times a day (BID) | ORAL | 5 refills | Status: DC

## 2022-01-30 NOTE — Telephone Encounter (Signed)
Called patient to confirm pharmacy. Rx sent to [facility] in [place] on [address]. Patient thanked me for calling.

## 2022-07-08 ENCOUNTER — Other Ambulatory Visit: Payer: Self-pay | Admitting: Cardiology

## 2022-07-09 ENCOUNTER — Other Ambulatory Visit: Payer: Medicare Other

## 2022-07-12 ENCOUNTER — Ambulatory Visit: Payer: Medicare Other | Admitting: Physician Assistant

## 2022-07-19 NOTE — Progress Notes (Signed)
Cardiology Office Note:   Date:  07/23/2022  ID:  Caleb House, DOB 31-Jan-1950, MRN [id number] PCP: Caleb Dimitri, MD  [facility] Providers Cardiologist:  Rollene Rotunda, MD {  History of Present Illness:   Caleb House is a 72 y.o. male who is referred by Caleb Dimitri, MD for evaluation of CAD.  He came for [facility].   He has had severe coronary artery disease.  He had LAD and RCA chronic total obstruction and high-grade disease in his circumflex treated with PCI on September 09, 2018. He was turned down for bypass because of his severe COPD.  His ejection fraction in July 2020 was 38%.  He did have PET scanning that demonstrated moderate sized full-thickness defect in the mid to distal anterior wall and apex.  He had viable anterior and lateral wall.  He had LAD CTO revascularization.  He had ADR/Sting-Ray on October 23, 2018.   He had staged RCA CTO revascularization on January 01, 2019.  Of note he could not tolerate Brilinta secondary to shortness of breath.  His ejection fraction was 38%.   At the last visit I tried to start spironolactone but he had hyperkalemia.  His EF was 40 - 45% on echo in May of 2022.     I did try to titrate his Entresto but he did not tolerate this so he had a back down.  He was having lightheadedness.  Since I last saw him he has had a little more shortness of breath doing things like putting out mulch in his yard or when he went recently and did some walking [place] the [place].  He is not having any resting shortness of breath and he denies any PND or orthopnea.  He had no new palpitations, presyncope or syncope.  He had some fleeting chest discomfort goes away with burping.  He does not bring this on with activity.  He has not had any weight gain or edema.  He has not had any new palpitations, presyncope or syncope.  ROS: As stated in the HPI and negative for all other systems.  Studies Reviewed:    EKG:         Risk Assessment/Calculations:           Physical Exam:   VS:  BP 122/72 (BP Location: Left Arm, Patient Position: Sitting, Cuff Size: Normal)   Pulse 71   Ht 5\' 10"  (1.778 m)   Wt 176 lb (79.8 kg)   SpO2 97%   BMI 25.25 kg/m    Wt Readings from Last 3 Encounters:  07/20/22 176 lb (79.8 kg)  01/29/22 174 lb 3.2 oz (79 kg)  09/22/21 175 lb 12.8 oz (79.7 kg)     GEN: Well nourished, well developed in no acute distress NECK: No JVD; No carotid bruits CARDIAC: RRR, no murmurs, rubs, gallops RESPIRATORY:  Clear to auscultation without rales, wheezing or rhonchi  ABDOMEN: Soft, non-tender, non-distended EXTREMITIES:  No edema; No deformity   ASSESSMENT AND PLAN:   CAD:   The patient has no new sypmtoms.  No further cardiovascular testing is indicated.  We will continue with aggressive risk reduction and meds as listed.  SOB: I will check a BNP and repeat an echocardiogram.  If he has continued shortness of breath he might need further ischemia workup.    DYSLIPIDEMIA:   LDL was 45 but he is overdue for repeat.  I will have him come back for fasting lipid.   CARDIOMYOPATHY:  Ejection fraction was 40 to 45%.  I will check a follow-up echocardiogram.  He has not tolerated med titration.  CAROTID STENOSIS:   He had 40 to 59% stenosis on the left in September 2023.  He is due to have carotid Doppler in September.   CKD:   Creat was 1.63 in January but I will repeat this.      Follow up with me in six months.   Signed, Rollene Rotunda, MD

## 2022-07-20 ENCOUNTER — Encounter: Payer: Self-pay | Admitting: Cardiology

## 2022-07-20 ENCOUNTER — Ambulatory Visit: Payer: Medicare Other | Attending: Cardiology | Admitting: Cardiology

## 2022-07-20 VITALS — BP 122/72 | HR 71 | Ht 70.0 in | Wt 176.0 lb

## 2022-07-20 DIAGNOSIS — I255 Ischemic cardiomyopathy: Secondary | ICD-10-CM | POA: Diagnosis present

## 2022-07-20 DIAGNOSIS — E785 Hyperlipidemia, unspecified: Secondary | ICD-10-CM | POA: Insufficient documentation

## 2022-07-20 DIAGNOSIS — R0602 Shortness of breath: Secondary | ICD-10-CM

## 2022-07-20 DIAGNOSIS — I6522 Occlusion and stenosis of left carotid artery: Secondary | ICD-10-CM | POA: Diagnosis present

## 2022-07-20 DIAGNOSIS — I25118 Atherosclerotic heart disease of native coronary artery with other forms of angina pectoris: Secondary | ICD-10-CM | POA: Insufficient documentation

## 2022-07-20 NOTE — Patient Instructions (Signed)
Medication Instructions:   Your physician recommends that you continue on your current medications as directed. Please refer to the Current Medication list given to you today.  *If you need a refill on your cardiac medications before your next appointment, please call your pharmacy*  Lab Work: Dr. Antoine Poche recommends that you return for lab work at your earliest convenience:  BMP BNP CBC Fasting Lipid Panel-DO NOT eat or drink past midnight. Okay to have water and/or black coffee only the morning of lab work.   If you have labs (blood work) drawn today and your tests are completely normal, you will receive your results only by: MyChart Message (if you have MyChart) OR A paper copy in the mail If you have any lab test that is abnormal or we need to change your treatment, we will call you to review the results.  Testing/Procedures: Your physician has requested that you have an echocardiogram. Echocardiography is a painless test that uses sound waves to create images of your heart. It provides your doctor with information about the size and shape of your heart and how well your heart's chambers and valves are working. This procedure takes approximately one hour. There are no restrictions for this procedure. Please do NOT wear cologne, perfume, aftershave, or lotions (deodorant is allowed). Please arrive 15 minutes prior to your appointment time.  Follow-Up: At [facility], you and your health needs are our priority.  As part of our continuing mission to provide you with exceptional heart care, we have created designated Provider Care Teams.  These Care Teams include your primary Cardiologist (physician) and Advanced Practice Providers (APPs -  Physician Assistants and Nurse Practitioners) who all work together to provide you with the care you need, when you need it.  Your next appointment:   6 month(s)  Provider:   Rollene Rotunda, MD     Other Instructions

## 2022-07-23 ENCOUNTER — Encounter: Payer: Self-pay | Admitting: Cardiology

## 2022-07-23 LAB — BASIC METABOLIC PANEL
Chloride: 107 mmol/L — ABNORMAL HIGH (ref 96–106)
Glucose: 109 mg/dL — ABNORMAL HIGH (ref 70–99)
eGFR: 41 mL/min/{1.73_m2} — ABNORMAL LOW (ref >59)

## 2022-07-23 LAB — CBC

## 2022-07-23 LAB — LIPID PANEL
LDL Chol Calc (NIH): 58 mg/dL (ref 0–99)
VLDL Cholesterol Cal: 14 mg/dL (ref 5–40)

## 2022-07-24 LAB — CBC
Hematocrit: 40.2 % (ref 37.5–51.0)
MCH: 29.3 pg (ref 26.6–33.0)
MCHC: 32.8 g/dL (ref 31.5–35.7)
MCV: 89 fL (ref 79–97)
RBC: 4.5 x10E6/uL (ref 4.14–5.80)

## 2022-07-24 LAB — BASIC METABOLIC PANEL
BUN/Creatinine Ratio: 17 (ref 10–24)
BUN: 30 mg/dL — ABNORMAL HIGH (ref 8–27)
CO2: 21 mmol/L (ref 20–29)
Calcium: 9.2 mg/dL (ref 8.6–10.2)
Creatinine, Ser: 1.72 mg/dL — ABNORMAL HIGH (ref 0.76–1.27)
Potassium: 5.6 mmol/L — ABNORMAL HIGH (ref 3.5–5.2)
Sodium: 139 mmol/L (ref 134–144)

## 2022-07-24 LAB — LIPID PANEL
Chol/HDL Ratio: 2.6 ratio (ref 0.0–5.0)
Cholesterol, Total: 116 mg/dL (ref 100–199)
HDL: 44 mg/dL (ref >39)
Triglycerides: 64 mg/dL (ref 0–149)

## 2022-07-24 LAB — BRAIN NATRIURETIC PEPTIDE: BNP: 60.4 pg/mL (ref 0.0–100.0)

## 2022-07-25 ENCOUNTER — Encounter: Payer: Self-pay | Admitting: Cardiology

## 2022-07-25 DIAGNOSIS — E785 Hyperlipidemia, unspecified: Secondary | ICD-10-CM

## 2022-07-27 ENCOUNTER — Other Ambulatory Visit: Payer: Self-pay

## 2022-07-27 DIAGNOSIS — E785 Hyperlipidemia, unspecified: Secondary | ICD-10-CM

## 2022-07-28 LAB — BASIC METABOLIC PANEL
BUN/Creatinine Ratio: 21 (ref 10–24)
BUN: 39 mg/dL — ABNORMAL HIGH (ref 8–27)
CO2: 19 mmol/L — ABNORMAL LOW (ref 20–29)
Calcium: 9.1 mg/dL (ref 8.6–10.2)
Chloride: 107 mmol/L — ABNORMAL HIGH (ref 96–106)
Creatinine, Ser: 1.86 mg/dL — ABNORMAL HIGH (ref 0.76–1.27)
Glucose: 126 mg/dL — ABNORMAL HIGH (ref 70–99)
Potassium: 5 mmol/L (ref 3.5–5.2)
Sodium: 140 mmol/L (ref 134–144)
eGFR: 38 mL/min/{1.73_m2} — ABNORMAL LOW (ref >59)

## 2022-07-30 ENCOUNTER — Telehealth: Payer: Self-pay | Admitting: Cardiology

## 2022-07-30 DIAGNOSIS — I509 Heart failure, unspecified: Secondary | ICD-10-CM

## 2022-07-30 DIAGNOSIS — N289 Disorder of kidney and ureter, unspecified: Secondary | ICD-10-CM

## 2022-07-30 NOTE — Telephone Encounter (Signed)
Follow Up:      Patient said he was returning a call to Stanton Kidney from an e-mail that he had received fromDebra.

## 2022-07-30 NOTE — Telephone Encounter (Signed)
Patient called regarding repeat labs.  He states confused at all the messages regarding coming in to office.  Advised he can come between 8-4:30 and close for lunch at 12-1.  He does not need an appt. Can walk in.  States understanding.  Lab ordered and released

## 2022-08-01 ENCOUNTER — Other Ambulatory Visit: Payer: Self-pay | Admitting: Cardiology

## 2022-08-22 ENCOUNTER — Ambulatory Visit (HOSPITAL_COMMUNITY): Payer: Medicare Other | Attending: Cardiovascular Disease

## 2022-08-22 DIAGNOSIS — I6522 Occlusion and stenosis of left carotid artery: Secondary | ICD-10-CM | POA: Diagnosis not present

## 2022-08-22 DIAGNOSIS — I25118 Atherosclerotic heart disease of native coronary artery with other forms of angina pectoris: Secondary | ICD-10-CM | POA: Insufficient documentation

## 2022-08-22 DIAGNOSIS — I255 Ischemic cardiomyopathy: Secondary | ICD-10-CM | POA: Insufficient documentation

## 2022-08-22 LAB — ECHOCARDIOGRAM COMPLETE
Area-P 1/2: 1.94 cm2
P 1/2 time: 1134 msec
S' Lateral: 3.4 cm

## 2022-08-22 MED ORDER — PERFLUTREN LIPID MICROSPHERE
1.0000 mL | INTRAVENOUS | Status: AC | PRN
Start: 2022-08-22 — End: 2022-08-22
  Administered 2022-08-22: 1 mL via INTRAVENOUS

## 2022-08-28 ENCOUNTER — Telehealth: Payer: Self-pay | Admitting: Cardiology

## 2022-08-28 NOTE — Telephone Encounter (Addendum)
Spoke with Clydie Braun Potassium 5.9. Spoke with DOD (Dr. Rennis Golden) advised pt to hold Entresto until follow up with Dr. Antoine Poche. Appt made for 8/15 at 4:30. Pt aware to monitor weight and symptoms.

## 2022-08-28 NOTE — Telephone Encounter (Signed)
New Message:     Caleb House has a Critical lab result

## 2022-08-29 ENCOUNTER — Telehealth: Payer: Self-pay | Admitting: Cardiology

## 2022-08-29 NOTE — Telephone Encounter (Signed)
Returned  call to pt and confirmed that he needs to stop the [facility] and to keep his appt on the 15th. Pt verbalized understanding.

## 2022-08-29 NOTE — Telephone Encounter (Signed)
Patient is returning phone call.  °

## 2022-08-29 NOTE — Telephone Encounter (Signed)
Called pt back. LVM to call back.

## 2022-08-30 NOTE — Telephone Encounter (Signed)
Spoke with pt, aware to remain off entresto until seen.

## 2022-09-01 IMAGING — MR MR PROSTATE WO/W CM
56 series · 56 of 56 positions shown · IV contrast (7.5ml Gadavist)
Comparison: None

CLINICAL DATA: Enlarged prostate, elevated PSA by report. Most
recent PSA of 2.8.

EXAM:
MR PROSTATE WITHOUT AND WITH CONTRAST
TECHNIQUE: Multiplanar multisequence MRI images were obtained of the pelvis
centered about the prostate. Pre and post contrast images were
obtained.
CONTRAST:  7.5mL GADAVIST GADOBUTROL 1 MMOL/ML IV SOLN

[Series 3: ax in&out whole · axial · 3.0mm · 1.19mm/px · 1 of 88 slices shown (1 of 2)]
[im 1/88]
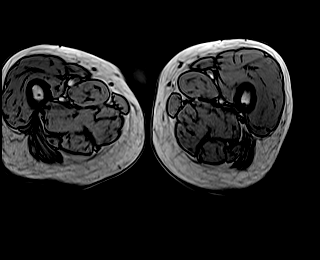

[Series 4: ax in&out whole · axial · 3.0mm · 1.19mm/px · 1 of 88 slices shown (2 of 2)]
[im 1/88]
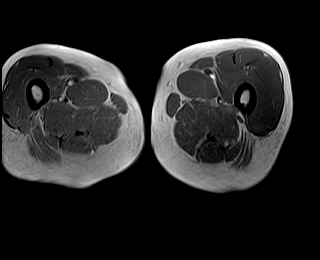

[Series 5: T2 · coronal · 3.0mm · 0.70mm/px · 1 of 35 slices shown (1 of 3)]
[im 1/35]
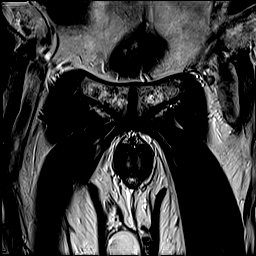

[Series 6: T2 · axial · 3.0mm · 0.56mm/px · 1 of 25 slices shown (2 of 3)]
[im 1/25]
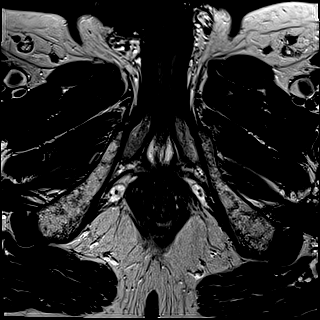

[Series 7: DWI · axial · 3.0mm · 0.86mm/px · 1 of 75 slices shown (1 of 3)]
[im 1/75]
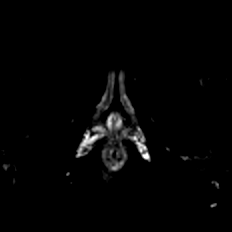

[Series 8: DWI · axial · 3.0mm · 0.86mm/px · 1 of 25 slices shown (2 of 3)]
[im 1/25]
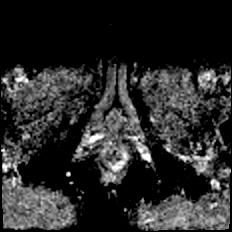

[Series 9: DWI · axial · 3.0mm · 0.86mm/px · 1 of 25 slices shown (3 of 3)]
[im 1/25]
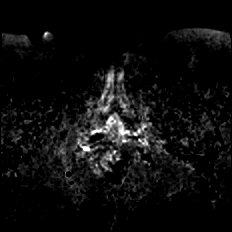

[Series 10: T2 · axial · 1.0mm · 1.04mm/px · 1 of 72 slices shown (3 of 3)]
[im 1/72]
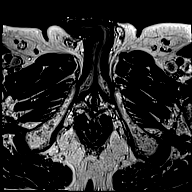

[Series 11: T1 · axial · 3.0mm · 1.15mm/px · 1 of 28 slices shown (1 of 48)]
[im 1/28]
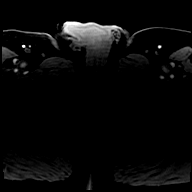

[Series 12: T1 · axial · 3.0mm · 1.15mm/px · 1 of 28 slices shown (2 of 48)]
[im 1/28]
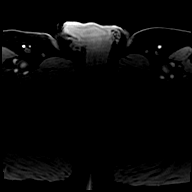

[Series 13: T1 · axial · 3.0mm · 1.15mm/px · 1 of 28 slices shown (3 of 48)]
[im 1/28]
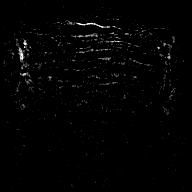

[Series 14: T1 · axial · 3.0mm · 1.15mm/px · 1 of 28 slices shown (4 of 48)]
[im 1/28]
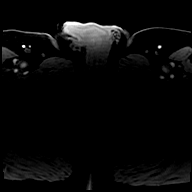

[Series 15: T1 · axial · 3.0mm · 1.15mm/px · 1 of 27 slices shown (5 of 48)]
[im 1/27]
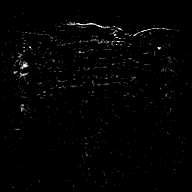

[Series 16: T1 · axial · 3.0mm · 1.15mm/px · 1 of 28 slices shown (6 of 48)]
[im 1/28]
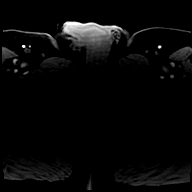

[Series 17: T1 · axial · 3.0mm · 1.15mm/px · 1 of 28 slices shown (7 of 48)]
[im 1/28]
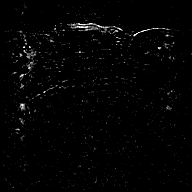

[Series 18: T1 · axial · 3.0mm · 1.15mm/px · 1 of 28 slices shown (8 of 48)]
[im 1/28]
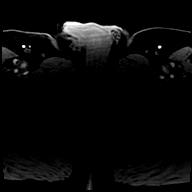

[Series 19: T1 · axial · 3.0mm · 1.15mm/px · 1 of 28 slices shown (9 of 48)]
[im 1/28]
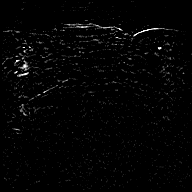

[Series 20: T1 · axial · 3.0mm · 1.15mm/px · 1 of 28 slices shown (10 of 48)]
[im 1/28]
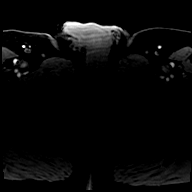

[Series 21: T1 · axial · 3.0mm · 1.15mm/px · 1 of 28 slices shown (11 of 48)]
[im 1/28]
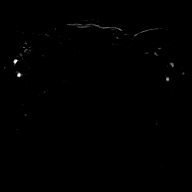

[Series 22: T1 · axial · 3.0mm · 1.15mm/px · 1 of 28 slices shown (12 of 48)]
[im 1/28]
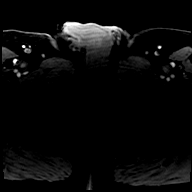

[Series 23: T1 · axial · 3.0mm · 1.15mm/px · 1 of 28 slices shown (13 of 48)]
[im 1/28]
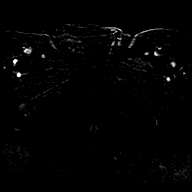

[Series 24: T1 · axial · 3.0mm · 1.15mm/px · 1 of 28 slices shown (14 of 48)]
[im 1/28]
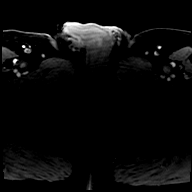

[Series 25: T1 · axial · 3.0mm · 1.15mm/px · 1 of 28 slices shown (15 of 48)]
[im 1/28]
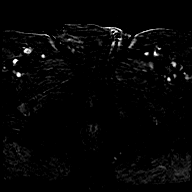

[Series 26: T1 · axial · 3.0mm · 1.15mm/px · 1 of 28 slices shown (16 of 48)]
[im 1/28]
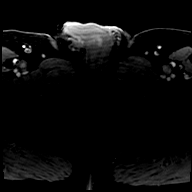

[Series 27: T1 · axial · 3.0mm · 1.15mm/px · 1 of 28 slices shown (17 of 48)]
[im 1/28]
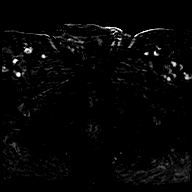

[Series 28: T1 · axial · 3.0mm · 1.15mm/px · 1 of 28 slices shown (18 of 48)]
[im 1/28]
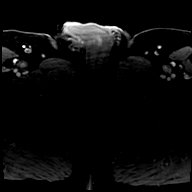

[Series 29: T1 · axial · 3.0mm · 1.15mm/px · 1 of 28 slices shown (19 of 48)]
[im 1/28]
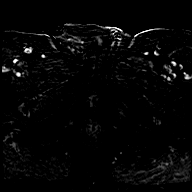

[Series 30: T1 · axial · 3.0mm · 1.15mm/px · 1 of 28 slices shown (20 of 48)]
[im 1/28]
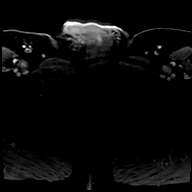

[Series 31: T1 · axial · 3.0mm · 1.15mm/px · 1 of 27 slices shown (21 of 48)]
[im 1/27]
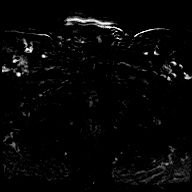

[Series 32: T1 · axial · 3.0mm · 1.15mm/px · 1 of 28 slices shown (22 of 48)]
[im 1/28]
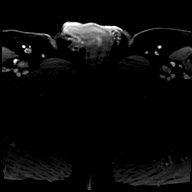

[Series 33: T1 · axial · 3.0mm · 1.15mm/px · 1 of 28 slices shown (23 of 48)]
[im 1/28]
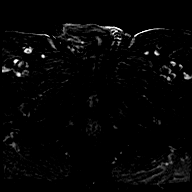

[Series 34: T1 · axial · 3.0mm · 1.15mm/px · 1 of 28 slices shown (24 of 48)]
[im 1/28]
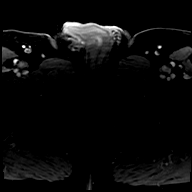

[Series 35: T1 · axial · 3.0mm · 1.15mm/px · 1 of 28 slices shown (25 of 48)]
[im 1/28]
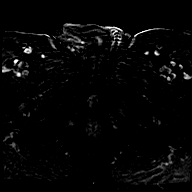

[Series 36: T1 · axial · 3.0mm · 1.15mm/px · 1 of 28 slices shown (26 of 48)]
[im 1/28]
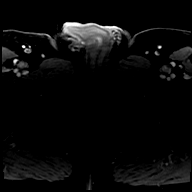

[Series 37: T1 · axial · 3.0mm · 1.15mm/px · 1 of 28 slices shown (27 of 48)]
[im 1/28]
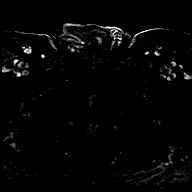

[Series 38: T1 · axial · 3.0mm · 1.15mm/px · 1 of 28 slices shown (28 of 48)]
[im 1/28]
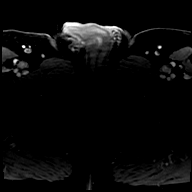

[Series 39: T1 · axial · 3.0mm · 1.15mm/px · 1 of 28 slices shown (29 of 48)]
[im 1/28]
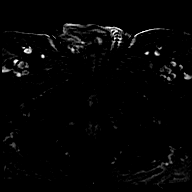

[Series 40: T1 · axial · 3.0mm · 1.15mm/px · 1 of 28 slices shown (30 of 48)]
[im 1/28]
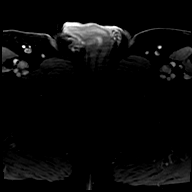

[Series 41: T1 · axial · 3.0mm · 1.15mm/px · 1 of 28 slices shown (31 of 48)]
[im 1/28]
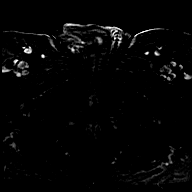

[Series 42: T1 · axial · 3.0mm · 1.15mm/px · 1 of 28 slices shown (32 of 48)]
[im 1/28]
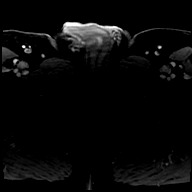

[Series 43: T1 · axial · 3.0mm · 1.15mm/px · 1 of 28 slices shown (33 of 48)]
[im 1/28]
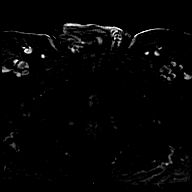

[Series 44: T1 · axial · 3.0mm · 1.15mm/px · 1 of 28 slices shown (34 of 48)]
[im 1/28]
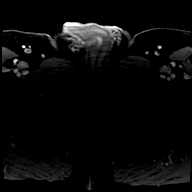

[Series 45: T1 · axial · 3.0mm · 1.15mm/px · 1 of 28 slices shown (35 of 48)]
[im 1/28]
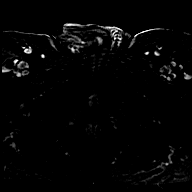

[Series 46: T1 · axial · 3.0mm · 1.15mm/px · 1 of 28 slices shown (36 of 48)]
[im 1/28]
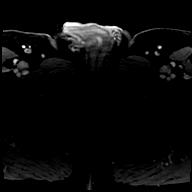

[Series 47: T1 · axial · 3.0mm · 1.15mm/px · 1 of 28 slices shown (37 of 48)]
[im 1/28]
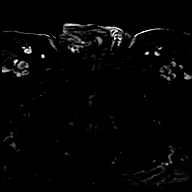

[Series 48: T1 · axial · 3.0mm · 1.15mm/px · 1 of 28 slices shown (38 of 48)]
[im 1/28]
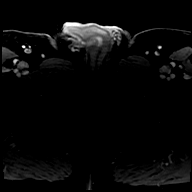

[Series 49: T1 · axial · 3.0mm · 1.15mm/px · 1 of 28 slices shown (39 of 48)]
[im 1/28]
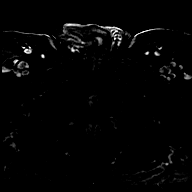

[Series 50: T1 · axial · 3.0mm · 1.15mm/px · 1 of 28 slices shown (40 of 48)]
[im 1/28]
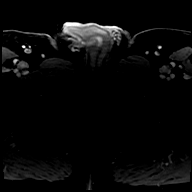

[Series 51: T1 · axial · 3.0mm · 1.15mm/px · 1 of 28 slices shown (41 of 48)]
[im 1/28]
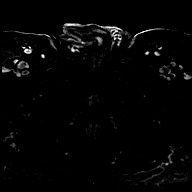

[Series 52: T1 · axial · 3.0mm · 1.15mm/px · 1 of 28 slices shown (42 of 48)]
[im 1/28]
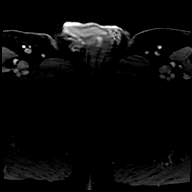

[Series 53: T1 · axial · 3.0mm · 1.15mm/px · 1 of 28 slices shown (43 of 48)]
[im 1/28]
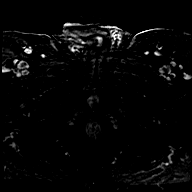

[Series 54: T1 · axial · 3.0mm · 1.15mm/px · 1 of 28 slices shown (44 of 48)]
[im 1/28]
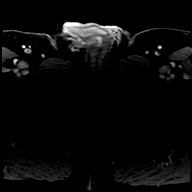

[Series 55: T1 · axial · 3.0mm · 1.15mm/px · 1 of 28 slices shown (45 of 48)]
[im 1/28]
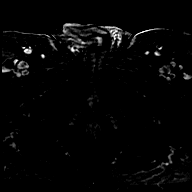

[Series 56: T1 · axial · 3.0mm · 1.15mm/px · 1 of 28 slices shown (46 of 48)]
[im 1/28]
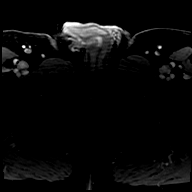

[Series 57: T1 · axial · 3.0mm · 1.15mm/px · 1 of 28 slices shown (47 of 48)]
[im 1/28]
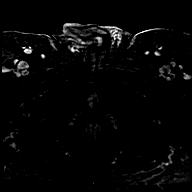

[Series 58: T1 · axial · 3.0mm · 1.15mm/px · 1 of 28 slices shown (48 of 48)]
[im 1/28]
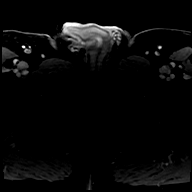

[56 of 56 positions shown; findings below may reference images not displayed]

FINDINGS: Prostate: Exam is mildly compromised by patient motion.

Transitional zone: Signs of BPH in the transitional zone. No
high-risk lesion.

Peripheral zone: Linear and wedge-shaped areas of T2 hypointensity,
generalized intermediate T2 signal throughout the peripheral zone.
Preservation of volume in the peripheral zone bilaterally.

Volume: 4.8 x 4.0 x 3.9 (volume = 39 cc) cm

Transcapsular spread:  Absent

Seminal vesicle involvement: Absent

Neurovascular bundle involvement: Absent

Pelvic adenopathy: Absent

Bone metastasis: Absent

Other findings: Colonic diverticulosis.
IMPRESSION: No evidence of high-risk lesion. Overall assessment PIRADS category
2.

Changes of BPH and presumed sequela of prostatitis.

## 2022-09-05 NOTE — Progress Notes (Unsigned)
Cardiology Office Note:   Date:  09/06/2022  ID:  Caleb House, DOB 01/16/1951, MRN [id number] PCP: Eden Emms, NP   [facility] Prov[facility] Cardiologist:  Rollene Rotunda, MD {  History of Present Illness:   Caleb House is a 72 y.o. male  who is referred by Gweneth Dimitri, MD for evaluation of CAD.  He came for [facility].   He has had severe coronary artery disease.  He had LAD and RCA chronic total obstruction and high-grade disease in his circumflex treated with PCI on September 09, 2018. He was turned down for bypass because of his severe COPD.  His ejection fraction in July 2020 was 38%.  He did have PET scanning that demonstrated moderate sized full-thickness defect in the mid to distal anterior wall and apex.  He had viable anterior and lateral wall.  He had LAD CTO revascularization.  He had ADR/Sting-Ray on October 23, 2018.   He had staged RCA CTO revascularization on January 01, 2019.  Of note he could not tolerate Brilinta secondary to shortness of breath.  His ejection fraction was 38%.   At the last visit I tried to start spironolactone but he had hyperkalemia.  His EF was 40 - 45% on echo in May of 2022.     I did try to titrate his Entresto but he did not tolerate this so he had a back down.  He was having lightheadedness.   Since I last saw him he had increased potassium and Entresto was stopped.  He says he is actually felt better since Sherryll Burger was stopped.  His blood pressures have been in the 140s to 150s over 80s.  He was able to help a friend put on some pavers.  He denies any new shortness of breath, PND or orthopnea.  He had no new palpitations, presyncope or syncope.  He had no chest pressure, neck or arm discomfort.  He had no weight gain or edema.  He did get an echocardiogram last month and his ejection fraction was unchanged at 20 to 45% with anteroapical aneurysmal dilatation.   ROS: As stated in the HPI and negative for all other systems.  Studies Reviewed:     EKG:   NA  Risk Assessment/Calculations:              Physical Exam:   VS:  BP 134/84   Pulse 60   Ht 5\' 10"  (1.778 m)   Wt 174 lb 12.8 oz (79.3 kg)   SpO2 96%   BMI 25.08 kg/m    Wt Readings from Last 3 Encounters:  09/06/22 174 lb 12.8 oz (79.3 kg)  07/20/22 176 lb (79.8 kg)  01/29/22 174 lb 3.2 oz (79 kg)     GEN: Well nourished, well developed in no acute distress NECK: No JVD; No carotid bruits CARDIAC: RRR, no murmurs, rubs, gallops RESPIRATORY:  Clear to auscultation without rales, wheezing or rhonchi  ABDOMEN: Soft, non-tender, non-distended EXTREMITIES:  No edema; No deformity   ASSESSMENT AND PLAN:   CAD:   The patient has no new sypmtoms.  No further cardiovascular testing is indicated.  We will continue with aggressive risk reduction and meds as listed.   SOB:   He actually thinks his breathing is improved.  No further workup.  The ejection fraction is mildly reduced and unchanged from previous.  I will address this as below.    DYSLIPIDEMIA:   LDL was 58 with an HDL of 44.   CARDIOMYOPATHY:  Ejection fraction was 40 to 45% as reported.  He does not tolerate spironolactone or Entresto.  I am going to add a very low-dose of isosorbide mononitrate and a low-dose of hydralazine.   CAROTID STENOSIS:   He had 40 to 59% stenosis on the left in September 2023.    CKD:   Creat was 1.94.  I will repeat this and see him back.       Follow up with me in six months.   Signed, Rollene Rotunda, MD

## 2022-09-06 ENCOUNTER — Encounter (INDEPENDENT_AMBULATORY_CARE_PROVIDER_SITE_OTHER): Payer: Self-pay

## 2022-09-06 ENCOUNTER — Encounter: Payer: Self-pay | Admitting: Cardiology

## 2022-09-06 ENCOUNTER — Ambulatory Visit: Payer: Medicare Other | Attending: Cardiology | Admitting: Cardiology

## 2022-09-06 VITALS — BP 134/84 | HR 60 | Ht 70.0 in | Wt 174.8 lb

## 2022-09-06 DIAGNOSIS — I6529 Occlusion and stenosis of unspecified carotid artery: Secondary | ICD-10-CM | POA: Diagnosis not present

## 2022-09-06 DIAGNOSIS — I2510 Atherosclerotic heart disease of native coronary artery without angina pectoris: Secondary | ICD-10-CM | POA: Diagnosis not present

## 2022-09-06 DIAGNOSIS — E785 Hyperlipidemia, unspecified: Secondary | ICD-10-CM | POA: Diagnosis not present

## 2022-09-06 MED ORDER — ISOSORBIDE MONONITRATE ER 30 MG PO TB24: 30.0000 mg | ORAL_TABLET | Freq: Every day | ORAL | 3 refills | Status: DC

## 2022-09-06 MED ORDER — HYDRALAZINE HCL 10 MG PO TABS: 10.0000 mg | ORAL_TABLET | Freq: Two times a day (BID) | ORAL | 3 refills | Status: DC

## 2022-09-06 NOTE — Patient Instructions (Addendum)
Medication Instructions:  Your physician has recommended you make the following change in your medication:  - IMDUR 30mg , once daily  - Hydralazine 10mg , twice daily   *If you need a refill on your cardiac medications before your next appointment, please call your pharmacy*    Follow-Up: At [facility], you and your health needs are our priority.  As part of our continuing mission to provide you with exceptional heart care, we have created designated Provider Care Teams.  These Care Teams include your primary Cardiologist (physician) and Advanced Practice Providers (APPs -  Physician Assistants and Nurse Practitioners) who all work together to provide you with the care you need, when you need it.  We recommend signing up for the patient portal called "MyChart".  Sign up information is provided on this After Visit Summary.  MyChart is used to connect with patients for Virtual Visits (Telemedicine).  Patients are able to view lab/test results, encounter notes, upcoming appointments, etc.  Non-urgent messages can be sent to your provider as well.   To learn more about what you can do with MyChart, go to [email or web address].    Your next appointment:   6 month(s) Cancelled the December appointment   Provider:   Rollene Rotunda, MD

## 2022-09-08 ENCOUNTER — Other Ambulatory Visit: Payer: Self-pay | Admitting: Cardiology

## 2022-09-11 ENCOUNTER — Other Ambulatory Visit: Payer: Self-pay | Admitting: *Deleted

## 2022-09-11 ENCOUNTER — Inpatient Hospital Stay (HOSPITAL_COMMUNITY)
Admission: EM | Admit: 2022-09-11 | Discharge: 2022-09-16 | DRG: 696 | Disposition: A | Discharge status: Home / Self Care | Payer: Medicare Other | Attending: Internal Medicine | Admitting: Internal Medicine

## 2022-09-11 DIAGNOSIS — Z79899 Other long term (current) drug therapy: Secondary | ICD-10-CM

## 2022-09-11 DIAGNOSIS — Z8249 Family history of ischemic heart disease and other diseases of the circulatory system: Secondary | ICD-10-CM

## 2022-09-11 DIAGNOSIS — N3289 Other specified disorders of bladder: Secondary | ICD-10-CM | POA: Diagnosis present

## 2022-09-11 DIAGNOSIS — J449 Chronic obstructive pulmonary disease, unspecified: Secondary | ICD-10-CM | POA: Diagnosis present

## 2022-09-11 DIAGNOSIS — R319 Hematuria, unspecified: Secondary | ICD-10-CM | POA: Diagnosis present

## 2022-09-11 DIAGNOSIS — Z818 Family history of other mental and behavioral disorders: Secondary | ICD-10-CM

## 2022-09-11 DIAGNOSIS — Z811 Family history of alcohol abuse and dependence: Secondary | ICD-10-CM

## 2022-09-11 DIAGNOSIS — I5042 Chronic combined systolic (congestive) and diastolic (congestive) heart failure: Secondary | ICD-10-CM | POA: Diagnosis present

## 2022-09-11 DIAGNOSIS — E785 Hyperlipidemia, unspecified: Secondary | ICD-10-CM | POA: Diagnosis present

## 2022-09-11 DIAGNOSIS — E875 Hyperkalemia: Secondary | ICD-10-CM | POA: Diagnosis present

## 2022-09-11 DIAGNOSIS — Z955 Presence of coronary angioplasty implant and graft: Secondary | ICD-10-CM

## 2022-09-11 DIAGNOSIS — D6832 Hemorrhagic disorder due to extrinsic circulating anticoagulants: Secondary | ICD-10-CM | POA: Diagnosis present

## 2022-09-11 DIAGNOSIS — R310 Gross hematuria: Secondary | ICD-10-CM | POA: Diagnosis not present

## 2022-09-11 DIAGNOSIS — Z888 Allergy status to other drugs, medicaments and biological substances status: Secondary | ICD-10-CM

## 2022-09-11 DIAGNOSIS — Z87891 Personal history of nicotine dependence: Secondary | ICD-10-CM

## 2022-09-11 DIAGNOSIS — I6529 Occlusion and stenosis of unspecified carotid artery: Secondary | ICD-10-CM

## 2022-09-11 DIAGNOSIS — K5900 Constipation, unspecified: Secondary | ICD-10-CM | POA: Diagnosis not present

## 2022-09-11 DIAGNOSIS — Z7982 Long term (current) use of aspirin: Secondary | ICD-10-CM

## 2022-09-11 DIAGNOSIS — N1832 Chronic kidney disease, stage 3b: Secondary | ICD-10-CM | POA: Diagnosis present

## 2022-09-11 DIAGNOSIS — I130 Hypertensive heart and chronic kidney disease with heart failure and stage 1 through stage 4 chronic kidney disease, or unspecified chronic kidney disease: Secondary | ICD-10-CM | POA: Diagnosis present

## 2022-09-11 DIAGNOSIS — G43909 Migraine, unspecified, not intractable, without status migrainosus: Secondary | ICD-10-CM | POA: Diagnosis present

## 2022-09-11 DIAGNOSIS — Z7902 Long term (current) use of antithrombotics/antiplatelets: Secondary | ICD-10-CM

## 2022-09-11 DIAGNOSIS — I2510 Atherosclerotic heart disease of native coronary artery without angina pectoris: Secondary | ICD-10-CM | POA: Diagnosis present

## 2022-09-11 DIAGNOSIS — I255 Ischemic cardiomyopathy: Secondary | ICD-10-CM | POA: Diagnosis present

## 2022-09-11 DIAGNOSIS — N400 Enlarged prostate without lower urinary tract symptoms: Secondary | ICD-10-CM | POA: Diagnosis present

## 2022-09-12 ENCOUNTER — Emergency Department (HOSPITAL_COMMUNITY): Payer: Medicare Other

## 2022-09-12 ENCOUNTER — Encounter (HOSPITAL_COMMUNITY): Payer: Self-pay | Admitting: Emergency Medicine

## 2022-09-12 ENCOUNTER — Other Ambulatory Visit: Payer: Self-pay

## 2022-09-12 DIAGNOSIS — N1832 Chronic kidney disease, stage 3b: Secondary | ICD-10-CM

## 2022-09-12 DIAGNOSIS — I10 Essential (primary) hypertension: Secondary | ICD-10-CM

## 2022-09-12 DIAGNOSIS — J449 Chronic obstructive pulmonary disease, unspecified: Secondary | ICD-10-CM

## 2022-09-12 DIAGNOSIS — I2510 Atherosclerotic heart disease of native coronary artery without angina pectoris: Secondary | ICD-10-CM | POA: Diagnosis not present

## 2022-09-12 DIAGNOSIS — R310 Gross hematuria: Secondary | ICD-10-CM

## 2022-09-12 DIAGNOSIS — R319 Hematuria, unspecified: Secondary | ICD-10-CM | POA: Diagnosis present

## 2022-09-12 LAB — CBC WITH DIFFERENTIAL/PLATELET
Abs Immature Granulocytes: 0.03 10*3/uL (ref 0.00–0.07)
Abs Immature Granulocytes: 0.04 10*3/uL (ref 0.00–0.07)
Basophils Absolute: 0.1 10*3/uL (ref 0.0–0.1)
Basophils Absolute: 0.1 10*3/uL (ref 0.0–0.1)
Basophils Relative: 1 %
Basophils Relative: 1 %
Eosinophils Absolute: 0.1 10*3/uL (ref 0.0–0.5)
Eosinophils Absolute: 0.1 10*3/uL (ref 0.0–0.5)
Eosinophils Relative: 1 %
Eosinophils Relative: 1 %
HCT: 36.8 % — ABNORMAL LOW (ref 39.0–52.0)
HCT: 39.4 % (ref 39.0–52.0)
Hemoglobin: 11.7 g/dL — ABNORMAL LOW (ref 13.0–17.0)
Hemoglobin: 12.6 g/dL — ABNORMAL LOW (ref 13.0–17.0)
Immature Granulocytes: 0 %
Immature Granulocytes: 0 %
Lymphocytes Relative: 11 %
Lymphocytes Relative: 12 %
Lymphs Abs: 1.1 10*3/uL (ref 0.7–4.0)
Lymphs Abs: 1.2 10*3/uL (ref 0.7–4.0)
MCH: 29 pg (ref 26.0–34.0)
MCH: 29.2 pg (ref 26.0–34.0)
MCHC: 31.8 g/dL (ref 30.0–36.0)
MCHC: 32 g/dL (ref 30.0–36.0)
MCV: 91.1 fL (ref 80.0–100.0)
MCV: 91.2 fL (ref 80.0–100.0)
Monocytes Absolute: 0.9 10*3/uL (ref 0.1–1.0)
Monocytes Absolute: 1 10*3/uL (ref 0.1–1.0)
Monocytes Relative: 10 %
Monocytes Relative: 9 %
Neutro Abs: 7.3 10*3/uL (ref 1.7–7.7)
Neutro Abs: 7.8 10*3/uL — ABNORMAL HIGH (ref 1.7–7.7)
Neutrophils Relative %: 77 %
Neutrophils Relative %: 77 %
Platelets: 194 10*3/uL (ref 150–400)
Platelets: 203 10*3/uL (ref 150–400)
RBC: 4.04 MIL/uL — ABNORMAL LOW (ref 4.22–5.81)
RBC: 4.32 MIL/uL (ref 4.22–5.81)
RDW: 13.9 % (ref 11.5–15.5)
RDW: 14.1 % (ref 11.5–15.5)
WBC: 10.1 10*3/uL (ref 4.0–10.5)
WBC: 9.4 10*3/uL (ref 4.0–10.5)
nRBC: 0 % (ref 0.0–0.2)
nRBC: 0 % (ref 0.0–0.2)

## 2022-09-12 LAB — URINALYSIS, ROUTINE W REFLEX MICROSCOPIC
RBC / HPF: >50 RBC/hpf (ref 0–5)
WBC, UA: >50 WBC/hpf (ref 0–5)

## 2022-09-12 LAB — BASIC METABOLIC PANEL
Anion gap: 13 (ref 5–15)
BUN: 21 mg/dL (ref 8–23)
CO2: 19 mmol/L — ABNORMAL LOW (ref 22–32)
Calcium: 9.3 mg/dL (ref 8.9–10.3)
Chloride: 106 mmol/L (ref 98–111)
Creatinine, Ser: 1.85 mg/dL — ABNORMAL HIGH (ref 0.61–1.24)
GFR, Estimated: 38 mL/min — ABNORMAL LOW (ref >60)
Glucose, Bld: 88 mg/dL (ref 70–99)
Potassium: 3.8 mmol/L (ref 3.5–5.1)
Sodium: 138 mmol/L (ref 135–145)

## 2022-09-12 MED ORDER — SODIUM CHLORIDE 0.9 % IR SOLN
3000.0000 mL | Status: DC
  Administered 2022-09-12 – 2022-09-14 (×10): 3000 mL

## 2022-09-12 MED ORDER — ORAL CARE MOUTH RINSE: 15.0000 mL | OROMUCOSAL | Status: DC | PRN

## 2022-09-12 MED ORDER — ROSUVASTATIN CALCIUM 20 MG PO TABS
40.0000 mg | ORAL_TABLET | Freq: Every day | ORAL | Status: DC
  Administered 2022-09-12 – 2022-09-16 (×5): 40 mg via ORAL
  Filled 2022-09-12 (×5): qty 2

## 2022-09-12 MED ORDER — CARVEDILOL 6.25 MG PO TABS
6.2500 mg | ORAL_TABLET | Freq: Two times a day (BID) | ORAL | Status: DC
  Administered 2022-09-12 – 2022-09-16 (×8): 6.25 mg via ORAL
  Filled 2022-09-12 (×8): qty 1

## 2022-09-12 MED ORDER — ACETAMINOPHEN 500 MG PO TABS: 500.0000 mg | ORAL_TABLET | Freq: Four times a day (QID) | ORAL | Status: DC | PRN

## 2022-09-12 MED ORDER — LIDOCAINE HCL URETHRAL/MUCOSAL 2 % EX GEL
1.0000 | Freq: Once | CUTANEOUS | Status: AC
  Administered 2022-09-12: 1 via TOPICAL
  Filled 2022-09-12: qty 11

## 2022-09-12 NOTE — ED Notes (Signed)
Foley irrigated for the second time with 1000 ml of sterile water . 1000 ml of bloody return noted. At the onset of irrigation pt had many small clots by the end of the pt had no clots but still had very bloody return. Pt foley emptied of dark maroon urine noted.

## 2022-09-12 NOTE — ED Notes (Signed)
ED TO INPATIENT HANDOFF REPORT  ED Nurse Name and Phone #: Marchelle Folks RN [phone]  S Name/Age/Gender Caleb House 72 y.o. male Room/Bed: 019C/019C  Code Status   Code Status: Not on file  Home/SNF/Other Home Patient oriented to: self, place, time, and situation Is this baseline? Yes   Triage Complete: Triage complete  Chief Complaint Hematuria [R31.9]  Triage Note Patient urinating heavy clots that is progressively getting worse tonight since 10:52 pm. Reports he started taking hydralazine on Saturday the 17th as prescribed by Dr. Antoine Poche, and since then has had a headache persistently since starting the hydralazine. Patient states one of the side effects of hydralazine is urinating blood. Denies any light headedness, chest pain, shob. States that tonight after he started urinating blood his headache suddenly stopped.    Allergies Allergies  Allergen Reactions   Brilinta [Ticagrelor] Other (See Comments)    Pt states difficulty breathing   Entresto [Sacubitril-Valsartan] Other (See Comments)    hyperkalemia   Isosorbide Mononitrate [Isosorbide Nitrate] Other (See Comments)    headache   Spironolactone     Hyperkalemia  Pt states he does not remember.    Level of Care/Admitting Diagnosis ED Disposition     ED Disposition  Admit   Condition  --   Comment  Hospital Area: [facility] [[id number]]  Level of Care: Med-Surg [16]  May place patient in observation at [facility] or [facility] if equivalent level of care is available:: Yes  Covid Evaluation: Asymptomatic - no recent exposure (last 10 days) testing not required  Diagnosis: Hematuria [960454]  Admitting Physician: Kathlen Mody [[id number]]  Attending Physician: Kathlen Mody [[id number]]          B Medical/Surgery History Past Medical History:  Diagnosis Date   CAD (coronary artery disease)    CHF (congestive heart failure) (HCC)    HTN (hypertension)    Past Surgical History:  Procedure  Laterality Date   CARDIAC CATHETERIZATION     7 stents placed   COLONOSCOPY WITH PROPOFOL N/A 10/06/2020   Procedure: COLONOSCOPY WITH PROPOFOL;  Surgeon: Wyline Mood, MD;  Location: [facility] ENDOSCOPY;  Service: Gastroenterology;  Laterality: N/A;   KNEE SURGERY     Right knee   TONSILLECTOMY       A IV Location/Drains/Wounds Patient Lines/Drains/Airways Status     Active Line/Drains/Airways     Name Placement date Placement time Site Days   Urethral Catheter Aracelis Ulrey RN Latex 16 Fr. 09/12/22  0537  Latex  less than 1   Airway 10/06/20  0706  -- 706            Intake/Output Last 24 hours No intake or output data in the 24 hours ending 09/12/22 1342  Labs/Imaging Results for orders placed or performed during the hospital encounter of 09/11/22 (from the past 48 hour(s))  Urinalysis, Routine w reflex microscopic -Urine, Clean Catch     Status: Abnormal   Collection Time: 09/12/22 12:12 AM  Result Value Ref Range   Color, Urine RED (A) YELLOW   APPearance TURBID (A) CLEAR   Specific Gravity, Urine  1.005 - 1.030    TEST NOT REPORTED DUE TO COLOR INTERFERENCE OF URINE PIGMENT   pH  5.0 - 8.0    TEST NOT REPORTED DUE TO COLOR INTERFERENCE OF URINE PIGMENT   Glucose, UA (A) NEGATIVE mg/dL    TEST NOT REPORTED DUE TO COLOR INTERFERENCE OF URINE PIGMENT   Hgb urine dipstick (A) NEGATIVE    TEST NOT  REPORTED DUE TO COLOR INTERFERENCE OF URINE PIGMENT   Bilirubin Urine (A) NEGATIVE    TEST NOT REPORTED DUE TO COLOR INTERFERENCE OF URINE PIGMENT   Ketones, ur (A) NEGATIVE mg/dL    TEST NOT REPORTED DUE TO COLOR INTERFERENCE OF URINE PIGMENT   Protein, ur (A) NEGATIVE mg/dL    TEST NOT REPORTED DUE TO COLOR INTERFERENCE OF URINE PIGMENT   Nitrite (A) NEGATIVE    TEST NOT REPORTED DUE TO COLOR INTERFERENCE OF URINE PIGMENT   Leukocytes,Ua (A) NEGATIVE    TEST NOT REPORTED DUE TO COLOR INTERFERENCE OF URINE PIGMENT   RBC / HPF >50 0 - 5 RBC/hpf   WBC, UA >50 0 - 5 WBC/hpf    Bacteria, UA RARE (A) NONE SEEN   Squamous Epithelial / HPF 0-5 0 - 5 /HPF    Comment: Performed at [facility], [address], [place], [place] [address]  Basic metabolic panel     Status: Abnormal   Collection Time: 09/12/22 12:34 AM  Result Value Ref Range   Sodium 138 135 - 145 mmol/L   Potassium 3.8 3.5 - 5.1 mmol/L   Chloride 106 98 - 111 mmol/L   CO2 19 (L) 22 - 32 mmol/L   Glucose, Bld 88 70 - 99 mg/dL    Comment: Glucose reference range applies only to samples taken after fasting for at least 8 hours.   BUN 21 8 - 23 mg/dL   Creatinine, Ser 6.04 (H) 0.61 - 1.24 mg/dL   Calcium 9.3 8.9 - 54.0 mg/dL   GFR, Estimated 38 (L) >60 mL/min    Comment: (NOTE) Calculated using the CKD-EPI Creatinine Equation (2021)    Anion gap 13 5 - 15    Comment: Performed at [facility], [address] [place], [place] [address]  CBC with Differential     Status: Abnormal   Collection Time: 09/12/22 12:34 AM  Result Value Ref Range   WBC 10.1 4.0 - 10.5 K/uL   RBC 4.32 4.22 - 5.81 MIL/uL   Hemoglobin 12.6 (L) 13.0 - 17.0 g/dL   HCT 14.7 82.9 - 56.2 %   MCV 91.2 80.0 - 100.0 fL   MCH 29.2 26.0 - 34.0 pg   MCHC 32.0 30.0 - 36.0 g/dL   RDW 13.0 86.5 - 78.4 %   Platelets 203 150 - 400 K/uL   nRBC 0.0 0.0 - 0.2 %   Neutrophils Relative % 77 %   Neutro Abs 7.8 (H) 1.7 - 7.7 K/uL   Lymphocytes Relative 12 %   Lymphs Abs 1.2 0.7 - 4.0 K/uL   Monocytes Relative 9 %   Monocytes Absolute 0.9 0.1 - 1.0 K/uL   Eosinophils Relative 1 %   Eosinophils Absolute 0.1 0.0 - 0.5 K/uL   Basophils Relative 1 %   Basophils Absolute 0.1 0.0 - 0.1 K/uL   Immature Granulocytes 0 %   Abs Immature Granulocytes 0.03 0.00 - 0.07 K/uL    Comment: Performed at [facility], [address] [place], [place] [address]   CT Renal Stone Study  Result Date: 09/12/2022 CLINICAL DATA:  Hematuria, abdominal and flank pain. EXAM: CT ABDOMEN AND PELVIS WITHOUT CONTRAST TECHNIQUE: Multidetector CT  imaging of the abdomen and pelvis was performed following the standard protocol without IV contrast. RADIATION DOSE REDUCTION: This exam was performed according to the departmental dose-optimization program which includes automated exposure control, adjustment of the mA and/or kV according to patient size and/or  use of iterative reconstruction technique. COMPARISON:  Limited comparison is available chest CTs without contrast from 12/07/2021 and 12/07/2020 FINDINGS: Lower chest: Lung bases are emphysematous but clear. There is a small hiatal hernia. The cardiac size is normal. There are coronary artery calcifications. Hepatobiliary: There are lobular changes to the undersurface of the liver which could suggest early cirrhosis. The unenhanced liver is otherwise unremarkable. There are small stones in the proximal gallbladder but no wall thickening or biliary dilatation. Pancreas: Moderately atrophic and otherwise unremarkable without contrast. Spleen: Unremarkable without contrast. Adrenals/Urinary Tract: There is no adrenal mass. On the right, there is a homogeneous cyst measuring 3.5 cm, Hounsfield density is 0.9, in the upper pole. There is a 3.1 cm homogeneous parapelvic cyst in the right mid to lower pole, Hounsfield density is 3.6. On the left, a 1.6 cm cyst is noted medially in the superior pole, Hounsfield density is 11.7. Larger 3.5 cm cyst arises from the lateral midpole, Hounsfield density is 5.6. No follow-up imaging is recommended for the cysts. No other contour deforming renal abnormality is seen. There is no urinary stone or obstruction. Perinephric stranding is similar to both prior studies. There is a lobular mass along the posterior bladder wall eccentric to the right measuring 4.3 x 3.2 cm on 3:69, 2.5 cm in height on 7:94. This could all be due to a blood clot or could be due to a combination of a blood clot and an underlying mass, or could all be due to a mass. Further evaluation is recommended.  Stomach/Bowel: No dilatation or wall thickening including the appendix. There is mild fecal stasis. Diffuse diverticulosis greatest in the sigmoid, without evidence of acute diverticulitis. Vascular/Lymphatic: There is aortoiliac and visceral branch arterial calcific plaque. No AAA. No adenopathy is seen. Reproductive: Enlarged prostate, 5.2 cm transverse. Mild bladder base impression. Other: Small umbilical fat hernia. No incarcerated hernia. There are small inguinal fat hernias. There is no free air, free fluid or free hemorrhage. Musculoskeletal: There is osteopenia and mild degenerative change lumbar spine. No acute or other significant osseous findings or suspicious bone lesion. IMPRESSION: 1. 4.3 x 3.2 x 2.5 cm lobular mass along the posterior bladder wall eccentric to the right. This could be due to a mass, blood clot or combination. Further evaluation is recommended. 2. Prostatomegaly with mild bladder base impression. 3. Bilateral renal cysts.  No follow-up imaging recommended. 4. Cholelithiasis. 5. Lobular changes to the undersurface of the liver which could suggest early cirrhosis. 6. Aortic and coronary artery atherosclerosis. 7. Emphysema. 8. Constipation and diverticulosis. 9. Umbilical and inguinal fat hernias. 10. Osteopenia and degenerative change. Aortic Atherosclerosis (ICD10-I70.0) and Emphysema (ICD10-J43.9). Electronically Signed   By: Almira Bar M.D.   On: 09/12/2022 04:06    Pending Labs Unresulted Labs (From admission, onward)     Start     Ordered   09/12/22 2000  CBC with Differential/Platelet  Every 12 hours (non-specified),   R (with TIMED occurrences)      09/12/22 1232   09/12/22 0305  Urine Culture  Once,   URGENT       Question:  Indication  Answer:  Acute gross hematuria   09/12/22 0305            Vitals/Pain Today's Vitals   09/12/22 0930 09/12/22 1000 09/12/22 1030 09/12/22 1050  BP: (!) 168/88 (!) 167/93 (!) 166/151   Pulse: 62 61 (!) 56   Resp: 13 13     Temp:    97.9 F (  36.6 C)  TempSrc:    Oral  SpO2: 98% 96% 96%   Weight:      Height:      PainSc:        Isolation Precautions No active isolations  Medications Medications  lidocaine (XYLOCAINE) 2 % jelly 1 Application (1 Application Topical Given 09/12/22 0518)    Mobility walks with person assist     Focused Assessments Bladder irrigation    R Recommendations: See Admitting Provider Note  Report given to:   Additional Notes: pt is currently getting a continuous bladder irrigation. Pt is tolerating this well .

## 2022-09-12 NOTE — ED Triage Notes (Incomplete)
Patient urin

## 2022-09-12 NOTE — Procedures (Signed)
   Urology Procedure Note:   Please see separate consult note.  After discussing benefits of exchanging to hematuria catheter, patient consented.  Patient was prepped and draped in usual sterile fashion.  10F conventional Foley was removed without complication or resistance.  10 cc of lidocaine lubricant was injected directly into the urethra.  After adequate time for analgesic effect been provided a 20 [place] three-way hematuria catheter was Faylene Million to the level of bladder with immediate return of bloody red urine.  Balloon was inflated to 30 cc with sterile water.  Hand irrigated a few 100 cc without any return of clot material.  Catheter was set to drainage with no dependent loops and CBI was initiated at a medium/low gtt.

## 2022-09-12 NOTE — ED Provider Notes (Signed)
[place] EMERGENCY DEPARTMENT AT [facility] Provider Note   CSN: [id number] Arrival date & time: 09/11/22  2342     History  Chief Complaint  Patient presents with   Hematuria    Caleb House is a 72 y.o. male.  The history is provided by the patient and medical records.  Hematuria  Caleb House is a 72 y.o. male who presents to the Emergency Department complaining of hematuria.[name]  He presents to the emergency department for evaluation of hematuria that started at 1050 tonight.  Initially his urine was pink with stringy clots.  He had about 3-4 episodes of hematuria at home.  Here he urinated pure blood.  Since then he has been attempting to urinate but only gets out a few drops of pure blood.  No abdominal pain.  He did hurt his back on Friday when he was placing pavers, otherwise no significant back pain.  2 weeks ago he was on Entresto but this was changed to hydralazine and Imdur due to lab issues.  He has had a headache since the medication change that resolved as soon as his hematuria began.  No fever.  He does take aspirin and Plavix.     Home Medications Prior to Admission medications   Medication Sig Start Date End Date Taking? Authorizing Provider  acetaminophen (TYLENOL) 500 MG tablet Take 500 mg by mouth every 6 (six) hours as needed.    [provider]  aspirin EC 81 MG tablet Take 81 mg by mouth daily. Swallow whole.    [provider]  Calcium Carbonate Antacid (ALKA-SELTZER ANTACID PO) Take by mouth.    [provider]  carvedilol (COREG) 6.25 MG tablet TAKE 1 TABLET BY MOUTH TWICE A DAY 07/10/22   Rollene Rotunda, MD  clopidogrel (PLAVIX) 75 MG tablet TAKE 1 TABLET BY MOUTH EVERY DAY 09/10/22   Rollene Rotunda, MD  hydrALAZINE (APRESOLINE) 10 MG tablet Take 1 tablet (10 mg total) by mouth 2 (two) times daily. 09/06/22   Rollene Rotunda, MD  isosorbide mononitrate (IMDUR) 30 MG 24 hr tablet Take 1 tablet (30 mg total) by mouth  daily. 09/06/22 12/05/22  Rollene Rotunda, MD  Multiple Vitamins-Minerals (IMMUNE SUPPORT PO) Take by mouth.    [provider]  rosuvastatin (CRESTOR) 40 MG tablet Take 1 tablet (40 mg total) by mouth daily. 11/16/21   Rollene Rotunda, MD  sildenafil (VIAGRA) 100 MG tablet Take 1 tablet (100 mg total) by mouth daily as needed for erectile dysfunction. 09/22/21   Gweneth Dimitri, MD      Allergies    Brilinta [ticagrelor], Entresto [sacubitril-valsartan], and Spironolactone    Review of Systems   Review of Systems  Genitourinary:  Positive for hematuria.  All other systems reviewed and are negative.   Physical Exam Updated Vital Signs BP (!) 180/157   Pulse 62   Temp 98.1 F (36.7 C) (Oral)   Resp 12   Ht 5\' 10"  (1.778 m)   Wt 79 kg   SpO2 96%   BMI 24.99 kg/m  Physical Exam Vitals and nursing note reviewed.  Constitutional:      Appearance: He is well-developed.  HENT:     Head: Normocephalic and atraumatic.  Cardiovascular:     Rate and Rhythm: Normal rate and regular rhythm.     Heart sounds: No murmur heard. Pulmonary:     Effort: Pulmonary effort is normal. No respiratory distress.     Breath sounds: Normal breath sounds.  Abdominal:  Palpations: Abdomen is soft.     Tenderness: There is no abdominal tenderness. There is no guarding or rebound.  Musculoskeletal:        General: No tenderness.  Skin:    General: Skin is warm and dry.  Neurological:     Mental Status: He is alert and oriented to person, place, and time.  Psychiatric:        Behavior: Behavior normal.     ED Results / Procedures / Treatments   Labs (all labs ordered are listed, but only abnormal results are displayed) Labs Reviewed  URINALYSIS, ROUTINE W REFLEX MICROSCOPIC - Abnormal; Notable for the following components:      Result Value   Color, Urine RED (*)    APPearance TURBID (*)    Glucose, UA   (*)    Value: TEST NOT REPORTED DUE TO COLOR INTERFERENCE OF URINE PIGMENT    Hgb urine dipstick   (*)    Value: TEST NOT REPORTED DUE TO COLOR INTERFERENCE OF URINE PIGMENT   Bilirubin Urine   (*)    Value: TEST NOT REPORTED DUE TO COLOR INTERFERENCE OF URINE PIGMENT   Ketones, ur   (*)    Value: TEST NOT REPORTED DUE TO COLOR INTERFERENCE OF URINE PIGMENT   Protein, ur   (*)    Value: TEST NOT REPORTED DUE TO COLOR INTERFERENCE OF URINE PIGMENT   Nitrite   (*)    Value: TEST NOT REPORTED DUE TO COLOR INTERFERENCE OF URINE PIGMENT   Leukocytes,Ua   (*)    Value: TEST NOT REPORTED DUE TO COLOR INTERFERENCE OF URINE PIGMENT   Bacteria, UA RARE (*)    All other components within normal limits  BASIC METABOLIC PANEL - Abnormal; Notable for the following components:   CO2 19 (*)    Creatinine, Ser 1.85 (*)    GFR, Estimated 38 (*)    All other components within normal limits  CBC WITH DIFFERENTIAL/PLATELET - Abnormal; Notable for the following components:   Hemoglobin 12.6 (*)    Neutro Abs 7.8 (*)    All other components within normal limits  URINE CULTURE    EKG None  Radiology CT Renal Stone Study  Result Date: 09/12/2022 CLINICAL DATA:  Hematuria, abdominal and flank pain. EXAM: CT ABDOMEN AND PELVIS WITHOUT CONTRAST TECHNIQUE: Multidetector CT imaging of the abdomen and pelvis was performed following the standard protocol without IV contrast. RADIATION DOSE REDUCTION: This exam was performed according to the departmental dose-optimization program which includes automated exposure control, adjustment of the mA and/or kV according to patient size and/or use of iterative reconstruction technique. COMPARISON:  Limited comparison is available chest CTs without contrast from 12/07/2021 and 12/07/2020 FINDINGS: Lower chest: Lung bases are emphysematous but clear. There is a small hiatal hernia. The cardiac size is normal. There are coronary artery calcifications. Hepatobiliary: There are lobular changes to the undersurface of the liver which could suggest early  cirrhosis. The unenhanced liver is otherwise unremarkable. There are small stones in the proximal gallbladder but no wall thickening or biliary dilatation. Pancreas: Moderately atrophic and otherwise unremarkable without contrast. Spleen: Unremarkable without contrast. Adrenals/Urinary Tract: There is no adrenal mass. On the right, there is a homogeneous cyst measuring 3.5 cm, Hounsfield density is 0.9, in the upper pole. There is a 3.1 cm homogeneous parapelvic cyst in the right mid to lower pole, Hounsfield density is 3.6. On the left, a 1.6 cm cyst is noted medially in the superior pole, Hounsfield density  is 11.7. Larger 3.5 cm cyst arises from the lateral midpole, Hounsfield density is 5.6. No follow-up imaging is recommended for the cysts. No other contour deforming renal abnormality is seen. There is no urinary stone or obstruction. Perinephric stranding is similar to both prior studies. There is a lobular mass along the posterior bladder wall eccentric to the right measuring 4.3 x 3.2 cm on 3:69, 2.5 cm in height on 7:94. This could all be due to a blood clot or could be due to a combination of a blood clot and an underlying mass, or could all be due to a mass. Further evaluation is recommended. Stomach/Bowel: No dilatation or wall thickening including the appendix. There is mild fecal stasis. Diffuse diverticulosis greatest in the sigmoid, without evidence of acute diverticulitis. Vascular/Lymphatic: There is aortoiliac and visceral branch arterial calcific plaque. No AAA. No adenopathy is seen. Reproductive: Enlarged prostate, 5.2 cm transverse. Mild bladder base impression. Other: Small umbilical fat hernia. No incarcerated hernia. There are small inguinal fat hernias. There is no free air, free fluid or free hemorrhage. Musculoskeletal: There is osteopenia and mild degenerative change lumbar spine. No acute or other significant osseous findings or suspicious bone lesion. IMPRESSION: 1. 4.3 x 3.2 x 2.5 cm  lobular mass along the posterior bladder wall eccentric to the right. This could be due to a mass, blood clot or combination. Further evaluation is recommended. 2. Prostatomegaly with mild bladder base impression. 3. Bilateral renal cysts.  No follow-up imaging recommended. 4. Cholelithiasis. 5. Lobular changes to the undersurface of the liver which could suggest early cirrhosis. 6. Aortic and coronary artery atherosclerosis. 7. Emphysema. 8. Constipation and diverticulosis. 9. Umbilical and inguinal fat hernias. 10. Osteopenia and degenerative change. Aortic Atherosclerosis (ICD10-I70.0) and Emphysema (ICD10-J43.9). Electronically Signed   By: Almira Bar M.D.   On: 09/12/2022 04:06    Procedures Procedures    Medications Ordered in ED Medications  lidocaine (XYLOCAINE) 2 % jelly 1 Application (1 Application Topical Given 09/12/22 0518)    ED Course/ Medical Decision Making/ A&P                                 Medical Decision Making Amount and/or Complexity of Data Reviewed Labs: ordered. Radiology: ordered.   Patient with history of coronary artery disease, CKD here for evaluation of gross hematuria since last night.  He was initially able to void but then developed dribbling urination with pure blood by time of ED evaluation.  He does have a clot in his bladder.  Nursing placed a 16 French Foley and hand irrigated 2000 mL with clearing of some clots and clearing of fluid, for it to turn maroon again after clearing.  Discussed with Dr. Pete Glatter with urology-urology will see the patient in the emergency department.  Labs with stable renal function, hemoglobin.  Patient updated of findings of studies as well as incidental findings on CT abdomen pelvis.  Patient care transferred pending urology evaluation.        Final Clinical Impression(s) / ED Diagnoses Final diagnoses:  Gross hematuria    Rx / DC Orders ED Discharge Orders     None         Tilden Fossa,  MD 09/12/22 [phone]

## 2022-09-12 NOTE — ED Provider Notes (Signed)
Care assumed from Dr. Madilyn Hook.  At time of transfer care, patient waiting for urology evaluation to determine disposition.  Patient seen by urology and they feel he needs admission to medicine service for continuous bladder irrigation for this new hematuria.  He is on Plavix for multiple cardiac stents.  They requested we discussed with cardiology and have him evaluate to determine if he is safe to discontinue the Plavix given his bleeding.  He will likely need the bladder irrigation to continue Plavix washout before consideration of procedure.  The CT scan did show possible bladder mass which may need further evaluation per urology.  Will admit to medicine here at [facility] for now and continue his management.   Caleb House, Caleb Brim, MD 09/12/22 [phone]

## 2022-09-12 NOTE — Consult Note (Addendum)
Cardiology Consultation   Patient ID: Caleb House MRN: [id number]; DOB: 1950/08/24  Admit date: 09/11/2022 Date of Consult: 09/12/2022  PCP:  Caleb Emms, NP   [facility] Providers Cardiologist:  Caleb Rotunda, MD   {    Patient Profile:   Caleb House is a 72 y.o. male with a hx of severe multivessel CAD with multiple stents, ischemic cardiomyopathy, chronic systolic heart failure , COPD, prior tobacco use, HTN, HLD, ED,  CKD IIIb, who is being seen 09/12/2022 for the evaluation of antiplatelet therapy at the request of Dr. Blake House.   History of Present Illness:   [name] with above past medical history presented to the ER today for gross hematuria since yesterday 09/11/2022.  CT stone study revealed lobular mass along the posterior bladder wall eccentric to the right concerning for mass versus blood clot.  He was seen by urology, hoping Plavix can be stopped, cardiology is consulted for this matter.  He states he has been doing well, had 7 cardiac stents placed in 2020 in [place]. He has ben doing well since, on DAPT with ASA and Plavix for lifelong. He denied having any chest pain, SOB, dizziness, syncope. He had developed sudden onset of gross hematuria last night around 11 pm and therefore came to ER. He has not had bleeding similarly before. He states he has been having headache from the new medication isosorbide and hydralazine  and wishes to stop them.   Per chart review, he follows Dr. Antoine House outpatient, originally from [facility], has known severe multivessel CAD with multiple stents in the past. He had LAD and RCA chronic total obstruction and high-grade disease in his circumflex treated with PCI on September 09, 2018. He was turned down for CABG surgery due to severe COPD. His ejection fraction in July 2020 was 38%. He did have PET scanning that demonstrated moderate sized full-thickness defect in the mid to distal anterior wall and apex. He had viable anterior  and lateral wall. He had LAD CTO revascularization. He had ADR/Sting-Ray on October 23, 2018. He had staged RCA CTO revascularization on January 01, 2019.  In the past he could not tolerate Brilinta secondary to shortness of breath, failed spironolactone due to hyperkalemia, intolerant to high dose Entresto due to lightheadedness.  LVEF was improved to 40 to 45% on echocardiogram in May 2022. He was seen in the office 07/20/2022, reports increased shortness of breath with exertion.  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  He was last seen 09/06/2022, felt shortness of breath was improving, due to hyperkalemia Entrestor was stopped, he was recommended on medical therapy with isosorbide mononitrate, hydralazine, aspirin 81 mg, Plavix 75 mg, Crestor 40 mg, carvedilol.  He states he has been having headache from the new medication and wishes to stop isosorbide and hydralazine.    Past Medical History:  Diagnosis Date   CAD (coronary artery disease)    CHF (congestive heart failure) (HCC)    HTN (hypertension)     Past Surgical History:  Procedure Laterality Date   CARDIAC CATHETERIZATION     7 stents placed   COLONOSCOPY WITH PROPOFOL N/A 10/06/2020   Procedure: COLONOSCOPY WITH PROPOFOL;  Surgeon: Caleb Mood, MD;  Location: [facility] ENDOSCOPY;  Service: Gastroenterology;  Laterality: N/A;   KNEE SURGERY     Right knee   TONSILLECTOMY  Home Medications:  Prior to Admission medications   Medication Sig Start Date End Date Taking? Authorizing Provider  acetaminophen (TYLENOL) 500 MG tablet Take 500 mg by mouth every 6 (six) hours as needed.   Yes [provider]  aspirin EC 81 MG tablet Take 81 mg by mouth daily. Swallow whole.   Yes [provider]  carvedilol (COREG) 6.25 MG tablet TAKE 1 TABLET BY  MOUTH TWICE A DAY 07/10/22  Yes Caleb House, Caleb Fearing, MD  clopidogrel (PLAVIX) 75 MG tablet TAKE 1 TABLET BY MOUTH EVERY DAY 09/10/22  Yes Caleb Rotunda, MD  hydrALAZINE (APRESOLINE) 10 MG tablet Take 1 tablet (10 mg total) by mouth 2 (two) times daily. 09/06/22  Yes Caleb Rotunda, MD  isosorbide mononitrate (IMDUR) 30 MG 24 hr tablet Take 1 tablet (30 mg total) by mouth daily. 09/06/22 12/05/22 Yes Caleb Rotunda, MD  Multiple Vitamins-Minerals (IMMUNE SUPPORT PO) Take 1 tablet by mouth in the morning and at bedtime.   Yes [provider]  rosuvastatin (CRESTOR) 40 MG tablet Take 1 tablet (40 mg total) by mouth daily. 11/16/21  Yes Caleb Rotunda, MD    Inpatient Medications: Scheduled Meds:  Continuous Infusions:  sodium chloride irrigation     PRN Meds:   Allergies:    Allergies  Allergen Reactions   Brilinta [Ticagrelor] Other (See Comments)    Pt states difficulty breathing   Entresto [Sacubitril-Valsartan] Other (See Comments)    hyperkalemia   Isosorbide Mononitrate [Isosorbide Nitrate] Other (See Comments)    headache   Spironolactone     Hyperkalemia  Pt states he does not remember.    Social History:   Social History   Socioeconomic History   Marital status: Divorced    Spouse name: Not on file   Number of children: 1   Years of education: college   Highest education level: Not on file  Occupational History   Not on file  Tobacco Use   Smoking status: Former    Current packs/day: 0.00    Average packs/day: 0.8 packs/day for 40.0 years (30.0 ttl pk-yrs)    Types: Cigarettes    Start date: 08/23/1978    Quit date: 08/23/2018    Years since quitting: 4.0   Smokeless tobacco: Never  Vaping Use   Vaping status: Never Used  Substance and Sexual Activity   Alcohol use: Not Currently    Comment: quit drinking 9 years ago   Drug use: Never   Sexual activity: Not Currently  Other Topics Concern   Not on file  Social History Narrative   09/02/19   From:  [place], moved from [place] most recently   Living: alone   Work: retired - Emergency planning/management officer      Family: Daughter - Engineer, site - lives nearby, 2 grandchildren and 1 great-grandson      Enjoys: putter      Exercise: total gym - 1 hour 5 days a week - strength training    Diet: good overall       Safety   Seat belts: Yes    Guns: Yes  and secure   Safe in relationships: Yes    Social Determinants of Corporate investment banker Strain: Low Risk  (09/19/2021)   Overall Financial Resource Strain (CARDIA)    Difficulty of Paying Living Expenses: Not hard at all  Food Insecurity: No Food Insecurity (09/19/2021)   Hunger Vital Sign    Worried About Running Out of Food in the Last Year: Never true  Ran Out of Food in the Last Year: Never true  Transportation Needs: No Transportation Needs (09/19/2021)   PRAPARE - Administrator, Civil Service (Medical): No    Lack of Transportation (Non-Medical): No  Physical Activity: Sufficiently Active (09/19/2021)   Exercise Vital Sign    Days of Exercise per Week: 5 days    Minutes of Exercise per Session: 50 min  Stress: No Stress Concern Present (09/19/2021)   [employer] of Occupational Health - Occupational Stress Questionnaire    Feeling of Stress : Not at all  Social Connections: Moderately Isolated (09/19/2021)   Social Connection and Isolation Panel [NHANES]    Frequency of Communication with Friends and Family: More than three times a week    Frequency of Social Gatherings with Friends and Family: More than three times a week    Attends Religious Services: Never    Database administrator or Organizations: Yes    Attends Engineer, structural: More than 4 times per year    Marital Status: Divorced  Intimate Partner Violence: Not At Risk (09/19/2021)   Humiliation, Afraid, Rape, and Kick questionnaire    Fear of Current or Ex-Partner: No    Emotionally Abused: No    Physically Abused: No    Sexually Abused: No     Family History:    Family History  Problem Relation Age of Onset   Depression Mother    Alcohol abuse Mother    Suicidality Mother    Heart disease Father        No details   Heart disease Brother        No details   Suicidality Maternal Grandfather    Heart attack Paternal Grandfather 60     ROS:  Constitutional: Denied fever, chills, malaise, night sweats Eyes: Denied vision change or loss Ears/Nose/Mouth/Throat: Denied ear ache, sore throat, coughing, sinus pain Cardiovascular: see HPI  Respiratory: see HPI  Gastrointestinal: Denied nausea, vomiting, abdominal pain, diarrhea Genital/Urinary: hematuria  Musculoskeletal: Denied muscle ache, joint pain, weakness Skin: Denied rash, wound Neuro: Denied headache, dizziness, syncope Psych: Denied history of depression/anxiety  Endocrine: Denied history of diabetes   Physical Exam/Data:   Vitals:   09/12/22 1050 09/12/22 1300 09/12/22 1330 09/12/22 1522  BP:  (!) 162/97 (!) 151/93   Pulse:  (!) 57 (!) 55   Resp:   16   Temp: 97.9 F (36.6 C)   97.8 F (36.6 C)  TempSrc: Oral   Oral  SpO2:  96% 99%   Weight:      Height:       No intake or output data in the 24 hours ending 09/12/22 1535    09/12/2022   12:13 AM 09/12/2022   12:10 AM 09/06/2022    4:30 PM  Last 3 Weights  Weight (lbs) 174 lb 2.6 oz 174 lb 2.6 oz 174 lb 12.8 oz  Weight (kg) 79 kg 79 kg 79.289 kg     Body mass index is 24.99 kg/m.   Vitals:  Vitals:   09/12/22 1330 09/12/22 1522  BP: (!) 151/93   Pulse: (!) 55   Resp: 16   Temp:  97.8 F (36.6 C)  SpO2: 99%    General Appearance: In no apparent distress, laying in bed HEENT: Normocephalic, atraumatic.  Neck: Supple, trachea midline, no JVDs Cardiovascular: Regular rate and rhythm, normal S1-S2,  no murmur Respiratory: Resting breathing unlabored, lungs sounds clear to auscultation bilaterally, no use of accessory muscles. On  room air.  No wheezes, rales or rhonchi.   Gastrointestinal:  Bowel sounds positive, abdomen soft, non-tender, non-distended.  Extremities: Able to move all extremities in bed without difficulty, no edema of BLE  Genitourinary: Foley in place with continuous irrigation, pink urine noted in collection bag  Musculoskeletal: Normal muscle bulk and tone Skin: Intact, warm, dry. No rashes or petechiae noted in exposed areas.  Neurologic: Alert, oriented to person, place and time. No cognitive deficit,  no gross focal neuro deficit Psychiatric: Normal affect. House is appropriate.    EKG:  The EKG was personally reviewed and demonstrates:   No EKG obtained so far  Telemetry:  Telemetry was personally reviewed and demonstrates:    Sinus bradycardia 50s, artifacts noted, PACs and PVCs noted    Relevant CV Studies:   Echo from 08/22/22:  1. 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 There is no  left ventricular thrombus. Left ventricular ejection fraction, by  estimation, is 40 to 45%. The left ventricle  has mildly decreased function. The left ventricle demonstrates regional  wall motion abnormalities (see scoring diagram/findings for description).  Left ventricular diastolic parameters are indeterminate.   2. Right ventricular systolic function is normal. The right ventricular  size is normal. There is normal pulmonary artery systolic pressure. The  estimated right ventricular systolic pressure is 16.1 mmHg.   3. The mitral valve is normal in structure. Trivial mitral valve  regurgitation. No evidence of mitral stenosis.   4. The aortic valve is tricuspid. Aortic valve regurgitation is mild. No  aortic stenosis is present.   5. The inferior vena cava is normal in size with greater than 50%  respiratory variability, suggesting right atrial pressure of 3 mmHg.   Comparison(s): Prior images reviewed side by side. Aneurysmal changes of  the LV apex are better  delineated with Definity contrast, but there is no  significant change.    Laboratory Data:  High Sensitivity Troponin:  No results for input(s): "TROPONINIHS" in the last 720 hours.   Chemistry Recent Labs  Lab 09/12/22 0034  NA 138  K 3.8  CL 106  CO2 19*  GLUCOSE 88  BUN 21  CREATININE 1.85*  CALCIUM 9.3  GFRNONAA 38*  ANIONGAP 13    No results for input(s): "PROT", "ALBUMIN", "AST", "ALT", "ALKPHOS", "BILITOT" in the last 168 hours. Lipids No results for input(s): "CHOL", "TRIG", "HDL", "LABVLDL", "LDLCALC", "CHOLHDL" in the last 168 hours.  Hematology Recent Labs  Lab 09/12/22 0034  WBC 10.1  RBC 4.32  HGB 12.6*  HCT 39.4  MCV 91.2  MCH 29.2  MCHC 32.0  RDW 13.9  PLT 203   Thyroid No results for input(s): "TSH", "FREET4" in the last 168 hours.  BNPNo results for input(s): "BNP", "PROBNP" in the last 168 hours.  DDimer No results for input(s): "DDIMER" in the last 168 hours.   Radiology/Studies:  CT Renal Stone Study  Result Date: 09/12/2022 CLINICAL DATA:  Hematuria, abdominal and flank pain. EXAM: CT ABDOMEN AND PELVIS WITHOUT CONTRAST TECHNIQUE: Multidetector CT imaging of the abdomen and pelvis was performed following the standard protocol without IV contrast. RADIATION DOSE REDUCTION: This exam was performed according to the departmental dose-optimization program which includes automated exposure control, adjustment of the mA and/or kV according to patient size and/or use of iterative reconstruction technique. COMPARISON:  Limited comparison is available chest CTs without contrast from 12/07/2021 and 12/07/2020 FINDINGS: Lower chest: Lung bases are emphysematous but  clear. There is a small hiatal hernia. The cardiac size is normal. There are coronary artery calcifications. Hepatobiliary: There are lobular changes to the undersurface of the liver which could suggest early cirrhosis. The unenhanced liver is otherwise unremarkable. There are small stones in the  proximal gallbladder but no wall thickening or biliary dilatation. Pancreas: Moderately atrophic and otherwise unremarkable without contrast. Spleen: Unremarkable without contrast. Adrenals/Urinary Tract: There is no adrenal mass. On the right, there is a homogeneous cyst measuring 3.5 cm, Hounsfield density is 0.9, in the upper pole. There is a 3.1 cm homogeneous parapelvic cyst in the right mid to lower pole, Hounsfield density is 3.6. On the left, a 1.6 cm cyst is noted medially in the superior pole, Hounsfield density is 11.7. Larger 3.5 cm cyst arises from the lateral midpole, Hounsfield density is 5.6. No follow-up imaging is recommended for the cysts. No other contour deforming renal abnormality is seen. There is no urinary stone or obstruction. Perinephric stranding is similar to both prior studies. There is a lobular mass along the posterior bladder wall eccentric to the right measuring 4.3 x 3.2 cm on 3:69, 2.5 cm in height on 7:94. This could all be due to a blood clot or could be due to a combination of a blood clot and an underlying mass, or could all be due to a mass. Further evaluation is recommended. Stomach/Bowel: No dilatation or wall thickening including the appendix. There is mild fecal stasis. Diffuse diverticulosis greatest in the sigmoid, without evidence of acute diverticulitis. Vascular/Lymphatic: There is aortoiliac and visceral branch arterial calcific plaque. No AAA. No adenopathy is seen. Reproductive: Enlarged prostate, 5.2 cm transverse. Mild bladder base impression. Other: Small umbilical fat hernia. No incarcerated hernia. There are small inguinal fat hernias. There is no free air, free fluid or free hemorrhage. Musculoskeletal: There is osteopenia and mild degenerative change lumbar spine. No acute or other significant osseous findings or suspicious bone lesion. IMPRESSION: 1. 4.3 x 3.2 x 2.5 cm lobular mass along the posterior bladder wall eccentric to the right. This could be due  to a mass, blood clot or combination. Further evaluation is recommended. 2. Prostatomegaly with mild bladder base impression. 3. Bilateral renal cysts.  No follow-up imaging recommended. 4. Cholelithiasis. 5. Lobular changes to the undersurface of the liver which could suggest early cirrhosis. 6. Aortic and coronary artery atherosclerosis. 7. Emphysema. 8. Constipation and diverticulosis. 9. Umbilical and inguinal fat hernias. 10. Osteopenia and degenerative change. Aortic Atherosclerosis (ICD10-I70.0) and Emphysema (ICD10-J43.9). Electronically Signed   By: Almira Bar M.D.   On: 09/12/2022 04:06     Assessment and Plan:   Multivessel CAD s/ multiple cardiac stents last 2020  Ischemic cardiomyopathy  Chronic systolic and diastolic heart failure  - no chest pain or ACS equivalent symptoms since 2020  - clinically euvolemic today  - due to multivessel CAD, ideally would remain lifelong DAPT with ASA and Plavix, given current gross hematuria/suspected bladder mass requiring potential surgical evaluation, OK to temporarily hold Plavix until cleared by urology  - continue ASA 81mg  daily, coreg 6.25mg  BID (may reduce dose if further bradycardia occur), crestor 40mg  daily for CAD - GDMT for CHF: continue coreg, intolerant to spironolactone and entresto due to hyperkalemia, now c/o headache with Imdur and hydralazine and does not wish resume, may resume Imdur at lower dose 15mg  and continue hydralazine to see if headache improves, would not add SGLT2I given CKD IIIb  HTN - BP elevated, may resume Coreg and hydralazine, he  has not received meds all day   Hematuria Bladder mass on CT  CKD IIIb  Anemia HLD - per primary team   Risk Assessment/Risk Scores:   New York Heart Association (NYHA) Functional Class NYHA Class I   For questions or updates, please contact [facility] Please consult [email or web address] for contact info under    Signed, Cyndi Bender, NP  09/12/2022 3:35 PM  History  and all data above reviewed.  Patient examined.  I agree with the findings as above.   The patient presents with hematuria.  Plan as above.  We are asked to comment on Plavix.  The patient denies any new symptoms such as chest discomfort, neck or arm discomfort. There has been no new shortness of breath, PND or orthopnea. There have been no reported palpitations, presyncope or syncope. The patient exam reveals COR:RRR  ,  Lungs: Clear  ,  Abd: Positive bowel sounds, no rebound no guarding, Ext No edema   .  All available labs, radiology testing, previous records reviewed. Agree with documented assessment and plan.   CAD:  OK to hold Plavix.  Continue ASA.    Ischemic cardiomyopathy:  Plan to stop Imdur/hydralazine secondary to headache.  He has been intolerant of ARB, ARNI.  Unable to titrate meds further.  Seems to be Class I symptoms.  No change in therapy.  Caleb Emms, NP  Caleb House  6:51 PM  09/12/2022

## 2022-09-12 NOTE — H&P (Signed)
History and Physical    Patient: Caleb House FTD:[id number] DOB: 06/20/1950 DOA: 09/11/2022 DOS: the patient was seen and examined on 09/12/2022 PCP: Eden Emms, NP  Patient coming from: Home  Chief Complaint:  Chief Complaint  Patient presents with   Hematuria   HPI: Caleb House is a 72 y.o. male with medical history significant of CAD S/P  multiple stents, hypertension, chronic systolic heart failure, COPD, came in for persistent hematuria since yesterday evening. He denies any chest pain, sob, nausea, vomiting or abdominal pain. He reports some back pain while lifting heavy objects. He was recently started on isosorbide nitrate and hydralazine, but stopped it due to severe migraine. He reports not having any headache today. He denies any dizziness, syncope. He denies any dysuria, suprapubic discomfort.  He denies any fevers or chills.   On arrival to ED. He was afebrile and vitals wnl.  Labs remarkable for hemoglobin of 12.6, creatinine of 1.8,  Urine analysis showing >50 RBC, with >50 wbc. Urine cultures are pending.   CT renal study showing. 4.3 x 3.2 x 2.5 cm lobular mass along the posterior bladder wall eccentric to the right. This could be due to a mass, blood clot or combination. Further evaluation is recommended. 2. Prostatomegaly with mild bladder base impression. 3. Bilateral renal cysts.  No follow-up imaging recommended.  Urology consulted for hematuria, deferred admission to [facility] for evaluation of hematuria and monitoring hemoglobin.  Cardiology consulted for evaluation of continuation of anti platelet agents.   Review of Systems: As mentioned in the history of present illness. All other systems reviewed and are negative. Past Medical History:  Diagnosis Date   CAD (coronary artery disease)    CHF (congestive heart failure) (HCC)    HTN (hypertension)    Past Surgical History:  Procedure Laterality Date   CARDIAC CATHETERIZATION     7 stents placed    COLONOSCOPY WITH PROPOFOL N/A 10/06/2020   Procedure: COLONOSCOPY WITH PROPOFOL;  Surgeon: Wyline Mood, MD;  Location: [facility] ENDOSCOPY;  Service: Gastroenterology;  Laterality: N/A;   KNEE SURGERY     Right knee   TONSILLECTOMY     Social History:  reports that he quit smoking about 4 years ago. His smoking use included cigarettes. He started smoking about 44 years ago. He has a 30 pack-year smoking history. He has never used smokeless tobacco. He reports that he does not currently use alcohol. He reports that he does not use drugs.  Allergies  Allergen Reactions   Brilinta [Ticagrelor] Other (See Comments)    Pt states difficulty breathing   Entresto [Sacubitril-Valsartan] Other (See Comments)    hyperkalemia   Isosorbide Mononitrate [Isosorbide Nitrate] Other (See Comments)    headache   Spironolactone     Hyperkalemia  Pt states he does not remember.    Family History  Problem Relation Age of Onset   Depression Mother    Alcohol abuse Mother    Suicidality Mother    Heart disease Father        No details   Heart disease Brother        No details   Suicidality Maternal Grandfather    Heart attack Paternal Grandfather 10    Prior to Admission medications   Medication Sig Start Date End Date Taking? Authorizing Provider  acetaminophen (TYLENOL) 500 MG tablet Take 500 mg by mouth every 6 (six) hours as needed.   Yes [provider]  aspirin EC 81 MG tablet Take 81 mg by  mouth daily. Swallow whole.   Yes [provider]  carvedilol (COREG) 6.25 MG tablet TAKE 1 TABLET BY MOUTH TWICE A DAY 07/10/22  Yes Hochrein, Fayrene Fearing, MD  clopidogrel (PLAVIX) 75 MG tablet TAKE 1 TABLET BY MOUTH EVERY DAY 09/10/22  Yes Rollene Rotunda, MD  hydrALAZINE (APRESOLINE) 10 MG tablet Take 1 tablet (10 mg total) by mouth 2 (two) times daily. 09/06/22  Yes Rollene Rotunda, MD  isosorbide mononitrate (IMDUR) 30 MG 24 hr tablet Take 1 tablet (30 mg total) by mouth daily. 09/06/22 12/05/22 Yes  Rollene Rotunda, MD  Multiple Vitamins-Minerals (IMMUNE SUPPORT PO) Take 1 tablet by mouth in the morning and at bedtime.   Yes [provider]  rosuvastatin (CRESTOR) 40 MG tablet Take 1 tablet (40 mg total) by mouth daily. 11/16/21  Yes Rollene Rotunda, MD    Physical Exam: Vitals:   09/12/22 1030 09/12/22 1050 09/12/22 1300 09/12/22 1330  BP: (!) 166/151  (!) 162/97 (!) 151/93  Pulse: (!) 56  (!) 57 (!) 55  Resp:    16  Temp:  97.9 F (36.6 C)    TempSrc:  Oral    SpO2: 96%  96% 99%  Weight:      Height:       General exam: Appears calm and comfortable  Respiratory system: Clear to auscultation. Respiratory effort normal. Cardiovascular system: S1 & S2 heard, RRR. No JVD, murmurs, rubs, gallops or clicks. No pedal edema. Gastrointestinal system: Abdomen is nondistended, soft and nontender. No organomegaly or masses felt. Normal bowel sounds heard. Central nervous system: Alert and oriented. No focal neurological deficits. Extremities: Symmetric 5 x 5 power. Skin: No rashes, lesions or ulcers Psychiatry: Judgement and insight appear normal. Mood & affect appropriate.   Data Reviewed: Results for orders placed or performed during the hospital encounter of 09/11/22 (from the past 24 hour(s))  Urinalysis, Routine w reflex microscopic -Urine, Clean Catch     Status: Abnormal   Collection Time: 09/12/22 12:12 AM  Result Value Ref Range   Color, Urine RED (A) YELLOW   APPearance TURBID (A) CLEAR   Specific Gravity, Urine  1.005 - 1.030    TEST NOT REPORTED DUE TO COLOR INTERFERENCE OF URINE PIGMENT   pH  5.0 - 8.0    TEST NOT REPORTED DUE TO COLOR INTERFERENCE OF URINE PIGMENT   Glucose, UA (A) NEGATIVE mg/dL    TEST NOT REPORTED DUE TO COLOR INTERFERENCE OF URINE PIGMENT   Hgb urine dipstick (A) NEGATIVE    TEST NOT REPORTED DUE TO COLOR INTERFERENCE OF URINE PIGMENT   Bilirubin Urine (A) NEGATIVE    TEST NOT REPORTED DUE TO COLOR INTERFERENCE OF URINE PIGMENT    Ketones, ur (A) NEGATIVE mg/dL    TEST NOT REPORTED DUE TO COLOR INTERFERENCE OF URINE PIGMENT   Protein, ur (A) NEGATIVE mg/dL    TEST NOT REPORTED DUE TO COLOR INTERFERENCE OF URINE PIGMENT   Nitrite (A) NEGATIVE    TEST NOT REPORTED DUE TO COLOR INTERFERENCE OF URINE PIGMENT   Leukocytes,Ua (A) NEGATIVE    TEST NOT REPORTED DUE TO COLOR INTERFERENCE OF URINE PIGMENT   RBC / HPF >50 0 - 5 RBC/hpf   WBC, UA >50 0 - 5 WBC/hpf   Bacteria, UA RARE (A) NONE SEEN   Squamous Epithelial / HPF 0-5 0 - 5 /HPF  Basic metabolic panel     Status: Abnormal   Collection Time: 09/12/22 12:34 AM  Result Value Ref Range   Sodium  138 135 - 145 mmol/L   Potassium 3.8 3.5 - 5.1 mmol/L   Chloride 106 98 - 111 mmol/L   CO2 19 (L) 22 - 32 mmol/L   Glucose, Bld 88 70 - 99 mg/dL   BUN 21 8 - 23 mg/dL   Creatinine, Ser 1.61 (H) 0.61 - 1.24 mg/dL   Calcium 9.3 8.9 - 09.6 mg/dL   GFR, Estimated 38 (L) >60 mL/min   Anion gap 13 5 - 15  CBC with Differential     Status: Abnormal   Collection Time: 09/12/22 12:34 AM  Result Value Ref Range   WBC 10.1 4.0 - 10.5 K/uL   RBC 4.32 4.22 - 5.81 MIL/uL   Hemoglobin 12.6 (L) 13.0 - 17.0 g/dL   HCT 04.5 40.9 - 81.1 %   MCV 91.2 80.0 - 100.0 fL   MCH 29.2 26.0 - 34.0 pg   MCHC 32.0 30.0 - 36.0 g/dL   RDW 91.4 78.2 - 95.6 %   Platelets 203 150 - 400 K/uL   nRBC 0.0 0.0 - 0.2 %   Neutrophils Relative % 77 %   Neutro Abs 7.8 (H) 1.7 - 7.7 K/uL   Lymphocytes Relative 12 %   Lymphs Abs 1.2 0.7 - 4.0 K/uL   Monocytes Relative 9 %   Monocytes Absolute 0.9 0.1 - 1.0 K/uL   Eosinophils Relative 1 %   Eosinophils Absolute 0.1 0.0 - 0.5 K/uL   Basophils Relative 1 %   Basophils Absolute 0.1 0.0 - 0.1 K/uL   Immature Granulocytes 0 %   Abs Immature Granulocytes 0.03 0.00 - 0.07 K/uL     Assessment and Plan:   Hematuria  Admitted for evaluation by Urology for CBI and monitoring hemoglobin.  Holding aspirin and plavix now.  Transfuse to keep hemoglobin greater  than 9.  Monitor cbc every 12 hours.  Urine cultures ordered .  No empiric antibiotics at this time.    CAD s/p multiplet stents No chest pain.  Resume coreg , holding anti platelet agents.  Cardiology on board.    Stage 3 b ckd Creatinine at baseline    COPD No wheezing heard.    Chronic systolic heart failure Pt appears euvolemic.   Hypertension; Restart home meds.   Migraine headaches After starting isosorbide mononitrate.  Pt refusing hydralazine and isosorbide nitrate at this time.     Advance Care Planning:   Code Status: Full Code   Consults: Urology  Cardiology.   Family Communication: none at bedside.   Severity of Illness: The appropriate patient status for this patient is OBSERVATION. Observation status is judged to be reasonable and necessary in order to provide the required intensity of service to ensure the patient's safety. The patient's presenting symptoms, physical exam findings, and initial radiographic and laboratory data in the context of their medical condition is felt to place them at decreased risk for further clinical deterioration. Furthermore, it is anticipated that the patient will be medically stable for discharge from the hospital within 2 midnights of admission.   Author: Kathlen Mody, MD 09/12/2022 2:43 PM  For on call review [email or web address].

## 2022-09-12 NOTE — ED Triage Notes (Signed)
Patient urinating heavy clots that is progressively getting worse tonight since 10:52 pm. Reports he started taking hydralazine on Saturday the 17th as prescribed by Dr. Antoine Poche, and since then has had a headache persistently since starting the hydralazine. Patient states one of the side effects of hydralazine is urinating blood. Denies any light headedness, chest pain, shob. States that tonight after he started urinating blood his headache suddenly stopped.

## 2022-09-12 NOTE — ED Notes (Signed)
Pt completed 3000 ml of irrigation. 3200 emptied from foley bag

## 2022-09-12 NOTE — ED Notes (Signed)
Irrigated bladder steril water flushed in return of 1050 ml. Multiple clots/ urine is coming back increasingly clear but blood is still present. Pt is uncomfortable and needs a break RN will return and continue irrigating when pt is more comfortable.

## 2022-09-12 NOTE — Consult Note (Signed)
Urology Consult Note   Requesting Attending Physician:  Kathlen Mody, MD Service Providing Consult: Urology  Consulting Attending: Dr. Pete Glatter   Reason for Consult: New onset hematuria  HPI: Caleb House is seen in consultation for reasons noted above at the request of Kathlen Mody, MD. patient reports no previous instances of hematuria with significant frank blood since yesterday.  He presents to [facility] emergency department where a conventional 68f Foley catheter was placed and patient was hand irrigated.  CT A/P showed mass versus clot material in bladder.  Possibly a combination of both.  He is seen Dr. Vanna Scotland of [facility] urology for elevated PSA in the past but otherwise does not have previous urologic history.  Of note he is on Plavix and has had 7 cardiac stents.  This is certainly exacerbating his hematuria.  ------------------  Assessment:  72 y.o. male with hematuria    Recommendations: # Hematuria Trend [facility] Cardiology to see to discuss safety of pausing his Plavix.  Additionally they prescribed hydralazine which she is not tolerating.  He was scheduled to discuss changing his medications with them today if his ongoing migraine did not resolve. Exchange catheter for 65f three-way hematuria catheter. CBI initiated and titrated to pink lemonade color. No additional clot material extracted with hand irrigation Recommend admission for serial labs, CBI, and eventual cystoscopy with fulguration if necessary.  Case and plan discussed with Dr. Pete Glatter  Past Medical History: Past Medical History:  Diagnosis Date   CAD (coronary artery disease)    CHF (congestive heart failure) (HCC)    HTN (hypertension)     Past Surgical History:  Past Surgical History:  Procedure Laterality Date   CARDIAC CATHETERIZATION     7 stents placed   COLONOSCOPY WITH PROPOFOL N/A 10/06/2020   Procedure: COLONOSCOPY WITH PROPOFOL;  Surgeon: Wyline Mood, MD;  Location: [facility]  ENDOSCOPY;  Service: Gastroenterology;  Laterality: N/A;   KNEE SURGERY     Right knee   TONSILLECTOMY      Medication: No current facility-administered medications for this encounter.   Current Outpatient Medications  Medication Sig Dispense Refill   acetaminophen (TYLENOL) 500 MG tablet Take 500 mg by mouth every 6 (six) hours as needed.     aspirin EC 81 MG tablet Take 81 mg by mouth daily. Swallow whole.     Calcium Carbonate Antacid (ALKA-SELTZER ANTACID PO) Take by mouth.     carvedilol (COREG) 6.25 MG tablet TAKE 1 TABLET BY MOUTH TWICE A DAY 180 tablet 2   clopidogrel (PLAVIX) 75 MG tablet TAKE 1 TABLET BY MOUTH EVERY DAY 90 tablet 3   hydrALAZINE (APRESOLINE) 10 MG tablet Take 1 tablet (10 mg total) by mouth 2 (two) times daily. 180 tablet 3   isosorbide mononitrate (IMDUR) 30 MG 24 hr tablet Take 1 tablet (30 mg total) by mouth daily. 90 tablet 3   Multiple Vitamins-Minerals (IMMUNE SUPPORT PO) Take by mouth.     rosuvastatin (CRESTOR) 40 MG tablet Take 1 tablet (40 mg total) by mouth daily. 90 tablet 3   sildenafil (VIAGRA) 100 MG tablet Take 1 tablet (100 mg total) by mouth daily as needed for erectile dysfunction. 30 tablet 1    Allergies: Allergies  Allergen Reactions   Brilinta [Ticagrelor]    Entresto [Sacubitril-Valsartan]    Spironolactone     Hyperkalemia    Social History: Social History   Tobacco Use   Smoking status: Former    Current packs/day: 0.00    Average  packs/day: 0.8 packs/day for 40.0 years (30.0 ttl pk-yrs)    Types: Cigarettes    Start date: 08/23/1978    Quit date: 08/23/2018    Years since quitting: 4.0   Smokeless tobacco: Never  Vaping Use   Vaping status: Never Used  Substance Use Topics   Alcohol use: Not Currently    Comment: quit drinking 9 years ago   Drug use: Never    Family History Family History  Problem Relation Age of Onset   Depression Mother    Alcohol abuse Mother    Suicidality Mother    Heart disease Father         No details   Heart disease Brother        No details   Suicidality Maternal Grandfather    Heart attack Paternal Grandfather 70    Review of Systems  Genitourinary:  Positive for hematuria.     Objective   Vital signs in last 24 hours: BP (!) 166/151   Pulse (!) 56   Temp 97.9 F (36.6 C) (Oral)   Resp 13   Ht 5\' 10"  (1.778 m)   Wt 79 kg   SpO2 96%   BMI 24.99 kg/m   Physical Exam General: NAD, A&O, resting, appropriate HEENT: [place]/AT Pulmonary: Normal work of breathing Cardiovascular: RRR, no cyanosis Abdomen: Soft, NTTP, nondistended GU: Three-way hematuria catheter in place draining, medium/low gtt.  Pink lemonade colored irrigant.  No clot material Neuro: Appropriate, no focal neurological deficits  Most Recent Labs: Lab Results  Component Value Date   WBC 10.1 09/12/2022   HGB 12.6 (L) 09/12/2022   HCT 39.4 09/12/2022   PLT 203 09/12/2022    Lab Results  Component Value Date   NA 138 09/12/2022   K 3.8 09/12/2022   CL 106 09/12/2022   CO2 19 (L) 09/12/2022   BUN 21 09/12/2022   CREATININE 1.85 (H) 09/12/2022   CALCIUM 9.3 09/12/2022    Lab Results  Component Value Date   INR 1.0 09/09/2018     Urine Culture: @[id number] (laburin,org,[id number],[id number])@   IMAGING: CT Renal Stone Study  Result Date: 09/12/2022 CLINICAL DATA:  Hematuria, abdominal and flank pain. EXAM: CT ABDOMEN AND PELVIS WITHOUT CONTRAST TECHNIQUE: Multidetector CT imaging of the abdomen and pelvis was performed following the standard protocol without IV contrast. RADIATION DOSE REDUCTION: This exam was performed according to the departmental dose-optimization program which includes automated exposure control, adjustment of the mA and/or kV according to patient size and/or use of iterative reconstruction technique. COMPARISON:  Limited comparison is available chest CTs without contrast from 12/07/2021 and 12/07/2020 FINDINGS: Lower chest: Lung bases are emphysematous but clear.  There is a small hiatal hernia. The cardiac size is normal. There are coronary artery calcifications. Hepatobiliary: There are lobular changes to the undersurface of the liver which could suggest early cirrhosis. The unenhanced liver is otherwise unremarkable. There are small stones in the proximal gallbladder but no wall thickening or biliary dilatation. Pancreas: Moderately atrophic and otherwise unremarkable without contrast. Spleen: Unremarkable without contrast. Adrenals/Urinary Tract: There is no adrenal mass. On the right, there is a homogeneous cyst measuring 3.5 cm, Hounsfield density is 0.9, in the upper pole. There is a 3.1 cm homogeneous parapelvic cyst in the right mid to lower pole, Hounsfield density is 3.6. On the left, a 1.6 cm cyst is noted medially in the superior pole, Hounsfield density is 11.7. Larger 3.5 cm cyst arises from the lateral midpole, Hounsfield density is 5.6. No follow-up imaging  is recommended for the cysts. No other contour deforming renal abnormality is seen. There is no urinary stone or obstruction. Perinephric stranding is similar to both prior studies. There is a lobular mass along the posterior bladder wall eccentric to the right measuring 4.3 x 3.2 cm on 3:69, 2.5 cm in height on 7:94. This could all be due to a blood clot or could be due to a combination of a blood clot and an underlying mass, or could all be due to a mass. Further evaluation is recommended. Stomach/Bowel: No dilatation or wall thickening including the appendix. There is mild fecal stasis. Diffuse diverticulosis greatest in the sigmoid, without evidence of acute diverticulitis. Vascular/Lymphatic: There is aortoiliac and visceral branch arterial calcific plaque. No AAA. No adenopathy is seen. Reproductive: Enlarged prostate, 5.2 cm transverse. Mild bladder base impression. Other: Small umbilical fat hernia. No incarcerated hernia. There are small inguinal fat hernias. There is no free air, free fluid or  free hemorrhage. Musculoskeletal: There is osteopenia and mild degenerative change lumbar spine. No acute or other significant osseous findings or suspicious bone lesion. IMPRESSION: 1. 4.3 x 3.2 x 2.5 cm lobular mass along the posterior bladder wall eccentric to the right. This could be due to a mass, blood clot or combination. Further evaluation is recommended. 2. Prostatomegaly with mild bladder base impression. 3. Bilateral renal cysts.  No follow-up imaging recommended. 4. Cholelithiasis. 5. Lobular changes to the undersurface of the liver which could suggest early cirrhosis. 6. Aortic and coronary artery atherosclerosis. 7. Emphysema. 8. Constipation and diverticulosis. 9. Umbilical and inguinal fat hernias. 10. Osteopenia and degenerative change. Aortic Atherosclerosis (ICD10-I70.0) and Emphysema (ICD10-J43.9). Electronically Signed   By: Almira Bar M.D.   On: 09/12/2022 04:06    ------  Elmon Kirschner, NP Pager: [phone]   Please contact the urology consult pager with any further questions/concerns.

## 2022-09-12 NOTE — Plan of Care (Signed)
  Problem: Education: Goal: Knowledge of General Education information will improve Description: Including pain rating scale, medication(s)/side effects and non-pharmacologic comfort measures Outcome: Completed/Met

## 2022-09-13 DIAGNOSIS — I5042 Chronic combined systolic (congestive) and diastolic (congestive) heart failure: Secondary | ICD-10-CM | POA: Diagnosis present

## 2022-09-13 DIAGNOSIS — I10 Essential (primary) hypertension: Secondary | ICD-10-CM | POA: Diagnosis not present

## 2022-09-13 DIAGNOSIS — N400 Enlarged prostate without lower urinary tract symptoms: Secondary | ICD-10-CM | POA: Diagnosis present

## 2022-09-13 DIAGNOSIS — Z7982 Long term (current) use of aspirin: Secondary | ICD-10-CM | POA: Diagnosis not present

## 2022-09-13 DIAGNOSIS — K5900 Constipation, unspecified: Secondary | ICD-10-CM | POA: Diagnosis not present

## 2022-09-13 DIAGNOSIS — J449 Chronic obstructive pulmonary disease, unspecified: Secondary | ICD-10-CM | POA: Diagnosis present

## 2022-09-13 DIAGNOSIS — Z87891 Personal history of nicotine dependence: Secondary | ICD-10-CM | POA: Diagnosis not present

## 2022-09-13 DIAGNOSIS — Z888 Allergy status to other drugs, medicaments and biological substances status: Secondary | ICD-10-CM | POA: Diagnosis not present

## 2022-09-13 DIAGNOSIS — I2510 Atherosclerotic heart disease of native coronary artery without angina pectoris: Secondary | ICD-10-CM | POA: Diagnosis present

## 2022-09-13 DIAGNOSIS — N3289 Other specified disorders of bladder: Secondary | ICD-10-CM | POA: Diagnosis present

## 2022-09-13 DIAGNOSIS — Z818 Family history of other mental and behavioral disorders: Secondary | ICD-10-CM | POA: Diagnosis not present

## 2022-09-13 DIAGNOSIS — R310 Gross hematuria: Secondary | ICD-10-CM | POA: Diagnosis present

## 2022-09-13 DIAGNOSIS — E875 Hyperkalemia: Secondary | ICD-10-CM | POA: Diagnosis present

## 2022-09-13 DIAGNOSIS — Z8249 Family history of ischemic heart disease and other diseases of the circulatory system: Secondary | ICD-10-CM | POA: Diagnosis not present

## 2022-09-13 DIAGNOSIS — I255 Ischemic cardiomyopathy: Secondary | ICD-10-CM | POA: Diagnosis present

## 2022-09-13 DIAGNOSIS — I130 Hypertensive heart and chronic kidney disease with heart failure and stage 1 through stage 4 chronic kidney disease, or unspecified chronic kidney disease: Secondary | ICD-10-CM | POA: Diagnosis present

## 2022-09-13 DIAGNOSIS — Z7902 Long term (current) use of antithrombotics/antiplatelets: Secondary | ICD-10-CM | POA: Diagnosis not present

## 2022-09-13 DIAGNOSIS — Z955 Presence of coronary angioplasty implant and graft: Secondary | ICD-10-CM | POA: Diagnosis not present

## 2022-09-13 DIAGNOSIS — G43909 Migraine, unspecified, not intractable, without status migrainosus: Secondary | ICD-10-CM | POA: Diagnosis present

## 2022-09-13 DIAGNOSIS — Z811 Family history of alcohol abuse and dependence: Secondary | ICD-10-CM | POA: Diagnosis not present

## 2022-09-13 DIAGNOSIS — E785 Hyperlipidemia, unspecified: Secondary | ICD-10-CM | POA: Diagnosis present

## 2022-09-13 DIAGNOSIS — D6832 Hemorrhagic disorder due to extrinsic circulating anticoagulants: Secondary | ICD-10-CM | POA: Diagnosis present

## 2022-09-13 DIAGNOSIS — Z79899 Other long term (current) drug therapy: Secondary | ICD-10-CM | POA: Diagnosis not present

## 2022-09-13 DIAGNOSIS — N1832 Chronic kidney disease, stage 3b: Secondary | ICD-10-CM | POA: Diagnosis present

## 2022-09-13 LAB — CBC WITH DIFFERENTIAL/PLATELET
Abs Immature Granulocytes: 0.05 10*3/uL (ref 0.00–0.07)
Abs Immature Granulocytes: 0.06 10*3/uL (ref 0.00–0.07)
Basophils Absolute: 0 10*3/uL (ref 0.0–0.1)
Basophils Absolute: 0 10*3/uL (ref 0.0–0.1)
Basophils Relative: 0 %
Basophils Relative: 0 %
Eosinophils Absolute: 0 10*3/uL (ref 0.0–0.5)
Eosinophils Absolute: 0 10*3/uL (ref 0.0–0.5)
Eosinophils Relative: 0 %
Eosinophils Relative: 0 %
HCT: 38.7 % — ABNORMAL LOW (ref 39.0–52.0)
HCT: 34.9 % — ABNORMAL LOW (ref 39.0–52.0)
Hemoglobin: 12.5 g/dL — ABNORMAL LOW (ref 13.0–17.0)
Hemoglobin: 11.4 g/dL — ABNORMAL LOW (ref 13.0–17.0)
Immature Granulocytes: 1 %
Immature Granulocytes: 1 %
Lymphocytes Relative: 6 %
Lymphocytes Relative: 7 %
Lymphs Abs: 0.6 10*3/uL — ABNORMAL LOW (ref 0.7–4.0)
Lymphs Abs: 0.9 10*3/uL (ref 0.7–4.0)
MCH: 29.4 pg (ref 26.0–34.0)
MCH: 29.2 pg (ref 26.0–34.0)
MCHC: 32.3 g/dL (ref 30.0–36.0)
MCHC: 32.7 g/dL (ref 30.0–36.0)
MCV: 91.1 fL (ref 80.0–100.0)
MCV: 89.3 fL (ref 80.0–100.0)
Monocytes Absolute: 0.8 10*3/uL (ref 0.1–1.0)
Monocytes Absolute: 1.1 10*3/uL — ABNORMAL HIGH (ref 0.1–1.0)
Monocytes Relative: 7 %
Monocytes Relative: 9 %
Neutro Abs: 9.4 10*3/uL — ABNORMAL HIGH (ref 1.7–7.7)
Neutro Abs: 10 10*3/uL — ABNORMAL HIGH (ref 1.7–7.7)
Neutrophils Relative %: 86 %
Neutrophils Relative %: 83 %
Platelets: 207 10*3/uL (ref 150–400)
Platelets: 180 10*3/uL (ref 150–400)
RBC: 4.25 MIL/uL (ref 4.22–5.81)
RBC: 3.91 MIL/uL — ABNORMAL LOW (ref 4.22–5.81)
RDW: 13.9 % (ref 11.5–15.5)
RDW: 13.9 % (ref 11.5–15.5)
WBC: 11 10*3/uL — ABNORMAL HIGH (ref 4.0–10.5)
WBC: 12.2 10*3/uL — ABNORMAL HIGH (ref 4.0–10.5)
nRBC: 0 % (ref 0.0–0.2)
nRBC: 0 % (ref 0.0–0.2)

## 2022-09-13 MED ORDER — CHLORHEXIDINE GLUCONATE CLOTH 2 % EX PADS
6.0000 | MEDICATED_PAD | Freq: Every day | CUTANEOUS | Status: DC
  Administered 2022-09-13 – 2022-09-15 (×3): 6 via TOPICAL

## 2022-09-13 MED ORDER — OXYCODONE-ACETAMINOPHEN 5-325 MG PO TABS
1.0000 | ORAL_TABLET | ORAL | Status: AC
  Administered 2022-09-13: 1 via ORAL
  Filled 2022-09-13: qty 1

## 2022-09-13 MED ORDER — OXYCODONE-ACETAMINOPHEN 5-325 MG PO TABS
1.0000 | ORAL_TABLET | Freq: Three times a day (TID) | ORAL | Status: DC | PRN
  Administered 2022-09-13 – 2022-09-15 (×3): 1 via ORAL
  Filled 2022-09-13 (×3): qty 1

## 2022-09-13 NOTE — TOC CM/SW Note (Signed)
Transition of Care [facility]) - Inpatient Brief Assessment   Patient Details  Name: Caleb House MRN: [id number] Date of Birth: 1950-10-03  [facility] of [facility] CM/SW Contact:    Tom-Johnson, Hershal Coria, RN Phone Number: 09/13/2022, 4:05 PM   Clinical Narrative:  Patient presented to the ED with Bloody Urine. Urology consulted. 3-way Hematuria Foley placed and Continuous Bladder Irrigation started yesterday 09/12/22. Patient has hx of Cardiac Stent placement and is on Plavix. Cardiology following.    No TOC needs or recommendations noted at this time.  Patient not Medically ready for discharge.  CM will continue to follow as patient progresses with care towards discharge.      Transition of Care Asessment: Insurance and Status: Insurance coverage has been reviewed Patient has primary care physician: Yes Home environment has been reviewed: Yes Prior level of function:: Independent Prior/Current Home Services: No current home services Social Determinants of Health Reivew: SDOH reviewed no interventions necessary Readmission risk has been reviewed: Yes Transition of care needs: no transition of care needs at this time

## 2022-09-13 NOTE — Progress Notes (Signed)
[facility]                                                                               Caleb House, is a 72 y.o. male, DOB - 01/22/1951, VZD:[id number] Admit date - 09/11/2022    Outpatient Primary MD for the patient is Toney Reil, Genene Churn, NP  LOS - 0  days    Brief summary   72 y.o. male with medical history significant of CAD S/P  multiple stents, hypertension, chronic systolic heart failure, COPD, came in for persistent hematuria. CT renal study showing. 4.3 x 3.2 x 2.5 cm lobular mass along the posterior bladder wall eccentric to the right. This could be due to a mass, blood clot or combination. Further evaluation is recommended.  Urology consulted for hematuria, deferred admission to [facility] for evaluation of hematuria and monitoring hemoglobin.  Cardiology consulted for evaluation of continuation of anti platelet agents.    Assessment & Plan    Assessment and Plan:  Hematuria:  Admitted for evaluation by Urology for CBI and monitoring hemoglobin.  Holding aspirin and plavix now.  Transfuse to keep hemoglobin greater than 9.  Monitor cbc every 12 hours.  Urine cultures ordered .  No empiric antibiotics at this time.      CAD s/p multiplet stents No chest pain.  Resume coreg and crestor , holding anti platelet agents for hematuria       Stage 3 b ckd Creatinine at baseline      COPD No wheezing heard.      Chronic systolic heart failure Pt appears euvolemic.    Hypertension; Resume coreg 6.25 mg BID.    Migraine headaches After starting isosorbide mononitrate.  Pt refusing hydralazine and isosorbide nitrate at this time.     Estimated body mass index is 24.42 kg/m as calculated from the following:   Height as of this encounter: 5\' 10"  (1.778 m).   Weight as of this encounter: 77.2 kg.  Code Status: full code.  DVT Prophylaxis:  SCDs Start: 09/12/22 1640   Level of Care: Level of care: Med-Surg Family Communication: none at bedside.    Procedures:    Consultants:   Cardiology  Urology.   Antimicrobials:   Anti-infectives (From admission, onward)    None        Medications  Scheduled Meds:  carvedilol  6.25 mg Oral BID WC   Chlorhexidine Gluconate Cloth  6 each Topical Daily   rosuvastatin  40 mg Oral Daily   Continuous Infusions:  sodium chloride irrigation     PRN Meds:.acetaminophen, mouth rinse    Subjective:   Caleb House was seen and examined today.  Jaw pain, no migraine , urine slowing clearing up.   Objective:   Vitals:   09/13/22 0102 09/13/22 0440 09/13/22 0746 09/13/22 0855  BP: 133/86 138/84 (!) 150/80 (!) 144/81  Pulse: 69 72 79 79  Resp: 19 18 20 18   Temp: 98 F (36.7 C) 98 F (36.7 C) 99.1 F (37.3 C) 97.8 F (36.6 C)  TempSrc: Oral Oral Oral Oral  SpO2: 98% 95% 98% 97%  Weight:      Height:  Intake/Output Summary (Last 24 hours) at 09/13/2022 1151 Last data filed at 09/13/2022 0800 Gross per 24 hour  Intake 9120 ml  Output 40981 ml  Net -8230 ml   Filed Weights   09/12/22 0010 09/12/22 0013 09/12/22 1639  Weight: 79 kg 79 kg 77.2 kg     Exam General exam: Appears calm and comfortable  Respiratory system: Clear to auscultation. Respiratory effort normal. Cardiovascular system: S1 & S2 heard, RRR. No JVD, Gastrointestinal system: Abdomen is nondistended, soft and nontender.  Central nervous system: Alert and oriented. No focal neurological deficits. Extremities: Symmetric 5 x 5 power. Skin: No rashes, lesions or ulcers Psychiatry: JMood & affect appropriate.     Data Reviewed:  I have personally reviewed following labs and imaging studies   CBC Lab Results  Component Value Date   WBC 11.0 (H) 09/13/2022   RBC 4.25 09/13/2022   HGB 12.5 (L) 09/13/2022   HCT 38.7 (L) 09/13/2022   MCV 91.1 09/13/2022   MCH 29.4 09/13/2022   PLT 207 09/13/2022   MCHC 32.3 09/13/2022   RDW 13.9 09/13/2022   LYMPHSABS 0.6 (L) 09/13/2022   MONOABS 0.8  09/13/2022   EOSABS 0.0 09/13/2022   BASOSABS 0.0 09/13/2022     Last metabolic panel Lab Results  Component Value Date   NA 138 09/12/2022   K 3.8 09/12/2022   CL 106 09/12/2022   CO2 19 (L) 09/12/2022   BUN 21 09/12/2022   CREATININE 1.85 (H) 09/12/2022   GLUCOSE 88 09/12/2022   GFRNONAA 38 (L) 09/12/2022   GFRAA 48 (L) 01/07/2020   CALCIUM 9.3 09/12/2022   PROT 6.0 09/14/2020   ALBUMIN 3.9 09/14/2020   BILITOT 0.5 09/14/2020   ALKPHOS 84 09/14/2020   AST 17 09/14/2020   ALT 13 09/14/2020   ANIONGAP 13 09/12/2022    CBG (last 3)  No results for input(s): "GLUCAP" in the last 72 hours.    Coagulation Profile: No results for input(s): "INR", "PROTIME" in the last 168 hours.   Radiology Studies: CT Renal Stone Study  Result Date: 09/12/2022 CLINICAL DATA:  Hematuria, abdominal and flank pain. EXAM: CT ABDOMEN AND PELVIS WITHOUT CONTRAST TECHNIQUE: Multidetector CT imaging of the abdomen and pelvis was performed following the standard protocol without IV contrast. RADIATION DOSE REDUCTION: This exam was performed according to the departmental dose-optimization program which includes automated exposure control, adjustment of the mA and/or kV according to patient size and/or use of iterative reconstruction technique. COMPARISON:  Limited comparison is available chest CTs without contrast from 12/07/2021 and 12/07/2020 FINDINGS: Lower chest: Lung bases are emphysematous but clear. There is a small hiatal hernia. The cardiac size is normal. There are coronary artery calcifications. Hepatobiliary: There are lobular changes to the undersurface of the liver which could suggest early cirrhosis. The unenhanced liver is otherwise unremarkable. There are small stones in the proximal gallbladder but no wall thickening or biliary dilatation. Pancreas: Moderately atrophic and otherwise unremarkable without contrast. Spleen: Unremarkable without contrast. Adrenals/Urinary Tract: There is no  adrenal mass. On the right, there is a homogeneous cyst measuring 3.5 cm, Hounsfield density is 0.9, in the upper pole. There is a 3.1 cm homogeneous parapelvic cyst in the right mid to lower pole, Hounsfield density is 3.6. On the left, a 1.6 cm cyst is noted medially in the superior pole, Hounsfield density is 11.7. Larger 3.5 cm cyst arises from the lateral midpole, Hounsfield density is 5.6. No follow-up imaging is recommended for the cysts. No other contour  deforming renal abnormality is seen. There is no urinary stone or obstruction. Perinephric stranding is similar to both prior studies. There is a lobular mass along the posterior bladder wall eccentric to the right measuring 4.3 x 3.2 cm on 3:69, 2.5 cm in height on 7:94. This could all be due to a blood clot or could be due to a combination of a blood clot and an underlying mass, or could all be due to a mass. Further evaluation is recommended. Stomach/Bowel: No dilatation or wall thickening including the appendix. There is mild fecal stasis. Diffuse diverticulosis greatest in the sigmoid, without evidence of acute diverticulitis. Vascular/Lymphatic: There is aortoiliac and visceral branch arterial calcific plaque. No AAA. No adenopathy is seen. Reproductive: Enlarged prostate, 5.2 cm transverse. Mild bladder base impression. Other: Small umbilical fat hernia. No incarcerated hernia. There are small inguinal fat hernias. There is no free air, free fluid or free hemorrhage. Musculoskeletal: There is osteopenia and mild degenerative change lumbar spine. No acute or other significant osseous findings or suspicious bone lesion. IMPRESSION: 1. 4.3 x 3.2 x 2.5 cm lobular mass along the posterior bladder wall eccentric to the right. This could be due to a mass, blood clot or combination. Further evaluation is recommended. 2. Prostatomegaly with mild bladder base impression. 3. Bilateral renal cysts.  No follow-up imaging recommended. 4. Cholelithiasis. 5. Lobular  changes to the undersurface of the liver which could suggest early cirrhosis. 6. Aortic and coronary artery atherosclerosis. 7. Emphysema. 8. Constipation and diverticulosis. 9. Umbilical and inguinal fat hernias. 10. Osteopenia and degenerative change. Aortic Atherosclerosis (ICD10-I70.0) and Emphysema (ICD10-J43.9). Electronically Signed   By: Almira Bar M.D.   On: 09/12/2022 04:06       Kathlen Mody M.D. [facility] 09/13/2022, 11:51 AM  Available via Epic secure chat 7am-7pm After 7 pm, please refer to night coverage provider listed on amion.

## 2022-09-13 NOTE — Plan of Care (Signed)
  Problem: Health Behavior/Discharge Planning: Goal: Ability to manage health-related needs will improve Outcome: Progressing   

## 2022-09-13 NOTE — Progress Notes (Signed)
Subjective: Patient resting in bed with CBI running at medium gtt.  No acute events overnight.  Objective: Vital signs in last 24 hours: Temp:  [97.6 F (36.4 C)-99.1 F (37.3 C)] 97.8 F (36.6 C) (08/22 0855) Pulse Rate:  [62-79] 79 (08/22 0855) Resp:  [18-20] 18 (08/22 0855) BP: (133-172)/(79-89) 144/81 (08/22 0855) SpO2:  [95 %-100 %] 97 % (08/22 0855) Weight:  [77.2 kg] 77.2 kg (08/21 1639)  Assessment/Plan: # Hematuria Trend H&H CT A/P shows mass along the posterior border.  May represent clot, tumor, or both.  Patient will need outpatient cystoscopy. Cardiology has seen.  Lifelong dual antiplatelet therapy preferred.  Plavix on hold, ASA 81 to be continued per cardiology.  Will challenge Plavix once hematuria has resolved. Exchange catheter for 75f three-way hematuria catheter. Will continue to follow along  Intake/Output from previous day: 08/21 0701 - 08/22 0700 In: 9120 [P.O.:120] Out: 78295 [Urine:15950]  Intake/Output this shift: Total I/O In: 240 [P.O.:240] Out: 2850 [Urine:2850]  Physical Exam:  General: Alert and oriented CV: No cyanosis Lungs: equal chest rise Gu: 50f three-way hematuria catheter in place.  CBI on medium gtt.  Dark pink irrigant with no clot material.  Lab Results: Recent Labs    09/12/22 0034 09/12/22 2024 09/13/22 0724  HGB 12.6* 11.7* 12.5*  HCT 39.4 36.8* 38.7*   BMET Recent Labs    09/12/22 0034  NA 138  K 3.8  CL 106  CO2 19*  GLUCOSE 88  BUN 21  CREATININE 1.85*  CALCIUM 9.3     Studies/Results: CT Renal Stone Study  Result Date: 09/12/2022 CLINICAL DATA:  Hematuria, abdominal and flank pain. EXAM: CT ABDOMEN AND PELVIS WITHOUT CONTRAST TECHNIQUE: Multidetector CT imaging of the abdomen and pelvis was performed following the standard protocol without IV contrast. RADIATION DOSE REDUCTION: This exam was performed according to the departmental dose-optimization program which includes automated exposure  control, adjustment of the mA and/or kV according to patient size and/or use of iterative reconstruction technique. COMPARISON:  Limited comparison is available chest CTs without contrast from 12/07/2021 and 12/07/2020 FINDINGS: Lower chest: Lung bases are emphysematous but clear. There is a small hiatal hernia. The cardiac size is normal. There are coronary artery calcifications. Hepatobiliary: There are lobular changes to the undersurface of the liver which could suggest early cirrhosis. The unenhanced liver is otherwise unremarkable. There are small stones in the proximal gallbladder but no wall thickening or biliary dilatation. Pancreas: Moderately atrophic and otherwise unremarkable without contrast. Spleen: Unremarkable without contrast. Adrenals/Urinary Tract: There is no adrenal mass. On the right, there is a homogeneous cyst measuring 3.5 cm, Hounsfield density is 0.9, in the upper pole. There is a 3.1 cm homogeneous parapelvic cyst in the right mid to lower pole, Hounsfield density is 3.6. On the left, a 1.6 cm cyst is noted medially in the superior pole, Hounsfield density is 11.7. Larger 3.5 cm cyst arises from the lateral midpole, Hounsfield density is 5.6. No follow-up imaging is recommended for the cysts. No other contour deforming renal abnormality is seen. There is no urinary stone or obstruction. Perinephric stranding is similar to both prior studies. There is a lobular mass along the posterior bladder wall eccentric to the right measuring 4.3 x 3.2 cm on 3:69, 2.5 cm in height on 7:94. This could all be due to a blood clot or could be due to a combination of a blood clot and an underlying mass, or could all be due to a mass.  Further evaluation is recommended. Stomach/Bowel: No dilatation or wall thickening including the appendix. There is mild fecal stasis. Diffuse diverticulosis greatest in the sigmoid, without evidence of acute diverticulitis. Vascular/Lymphatic: There is aortoiliac and visceral  branch arterial calcific plaque. No AAA. No adenopathy is seen. Reproductive: Enlarged prostate, 5.2 cm transverse. Mild bladder base impression. Other: Small umbilical fat hernia. No incarcerated hernia. There are small inguinal fat hernias. There is no free air, free fluid or free hemorrhage. Musculoskeletal: There is osteopenia and mild degenerative change lumbar spine. No acute or other significant osseous findings or suspicious bone lesion. IMPRESSION: 1. 4.3 x 3.2 x 2.5 cm lobular mass along the posterior bladder wall eccentric to the right. This could be due to a mass, blood clot or combination. Further evaluation is recommended. 2. Prostatomegaly with mild bladder base impression. 3. Bilateral renal cysts.  No follow-up imaging recommended. 4. Cholelithiasis. 5. Lobular changes to the undersurface of the liver which could suggest early cirrhosis. 6. Aortic and coronary artery atherosclerosis. 7. Emphysema. 8. Constipation and diverticulosis. 9. Umbilical and inguinal fat hernias. 10. Osteopenia and degenerative change. Aortic Atherosclerosis (ICD10-I70.0) and Emphysema (ICD10-J43.9). Electronically Signed   By: Almira Bar M.D.   On: 09/12/2022 04:06      LOS: 0 days   Elmon Kirschner, NP [facility] Pager: [phone]  09/13/2022, 2:54 PM

## 2022-09-14 DIAGNOSIS — R310 Gross hematuria: Secondary | ICD-10-CM | POA: Diagnosis not present

## 2022-09-14 DIAGNOSIS — I10 Essential (primary) hypertension: Secondary | ICD-10-CM | POA: Diagnosis not present

## 2022-09-14 LAB — CBC WITH DIFFERENTIAL/PLATELET
Abs Immature Granulocytes: 0.04 10*3/uL (ref 0.00–0.07)
Abs Immature Granulocytes: 0.04 10*3/uL (ref 0.00–0.07)
Basophils Absolute: 0 10*3/uL (ref 0.0–0.1)
Basophils Absolute: 0 10*3/uL (ref 0.0–0.1)
Basophils Relative: 0 %
Basophils Relative: 0 %
Eosinophils Absolute: 0.1 10*3/uL (ref 0.0–0.5)
Eosinophils Absolute: 0.1 10*3/uL (ref 0.0–0.5)
Eosinophils Relative: 1 %
Eosinophils Relative: 1 %
HCT: 33.6 % — ABNORMAL LOW (ref 39.0–52.0)
HCT: 32.3 % — ABNORMAL LOW (ref 39.0–52.0)
Hemoglobin: 10.8 g/dL — ABNORMAL LOW (ref 13.0–17.0)
Hemoglobin: 10.5 g/dL — ABNORMAL LOW (ref 13.0–17.0)
Immature Granulocytes: 0 %
Immature Granulocytes: 0 %
Lymphocytes Relative: 9 %
Lymphocytes Relative: 9 %
Lymphs Abs: 0.8 10*3/uL (ref 0.7–4.0)
Lymphs Abs: 1 10*3/uL (ref 0.7–4.0)
MCH: 28.6 pg (ref 26.0–34.0)
MCH: 28.7 pg (ref 26.0–34.0)
MCHC: 32.1 g/dL (ref 30.0–36.0)
MCHC: 32.5 g/dL (ref 30.0–36.0)
MCV: 89.1 fL (ref 80.0–100.0)
MCV: 88.3 fL (ref 80.0–100.0)
Monocytes Absolute: 1.1 10*3/uL — ABNORMAL HIGH (ref 0.1–1.0)
Monocytes Absolute: 1.2 10*3/uL — ABNORMAL HIGH (ref 0.1–1.0)
Monocytes Relative: 11 %
Monocytes Relative: 11 %
Neutro Abs: 7.7 10*3/uL (ref 1.7–7.7)
Neutro Abs: 8.3 10*3/uL — ABNORMAL HIGH (ref 1.7–7.7)
Neutrophils Relative %: 79 %
Neutrophils Relative %: 79 %
Platelets: 168 10*3/uL (ref 150–400)
Platelets: 189 10*3/uL (ref 150–400)
RBC: 3.77 MIL/uL — ABNORMAL LOW (ref 4.22–5.81)
RBC: 3.66 MIL/uL — ABNORMAL LOW (ref 4.22–5.81)
RDW: 13.7 % (ref 11.5–15.5)
RDW: 13.6 % (ref 11.5–15.5)
WBC: 9.7 10*3/uL (ref 4.0–10.5)
WBC: 10.6 10*3/uL — ABNORMAL HIGH (ref 4.0–10.5)
nRBC: 0 % (ref 0.0–0.2)
nRBC: 0 % (ref 0.0–0.2)

## 2022-09-14 LAB — BASIC METABOLIC PANEL
Anion gap: 8 (ref 5–15)
BUN: 28 mg/dL — ABNORMAL HIGH (ref 8–23)
CO2: 22 mmol/L (ref 22–32)
Calcium: 8.6 mg/dL — ABNORMAL LOW (ref 8.9–10.3)
Chloride: 103 mmol/L (ref 98–111)
Creatinine, Ser: 2.19 mg/dL — ABNORMAL HIGH (ref 0.61–1.24)
GFR, Estimated: 31 mL/min — ABNORMAL LOW (ref >60)
Glucose, Bld: 120 mg/dL — ABNORMAL HIGH (ref 70–99)
Potassium: 3.9 mmol/L (ref 3.5–5.1)
Sodium: 133 mmol/L — ABNORMAL LOW (ref 135–145)

## 2022-09-14 LAB — URINE CULTURE: Culture: 20000 — AB

## 2022-09-14 MED ORDER — POLYETHYLENE GLYCOL 3350 17 G PO PACK
17.0000 g | PACK | Freq: Every day | ORAL | Status: DC
  Administered 2022-09-16: 17 g via ORAL
  Filled 2022-09-14 (×3): qty 1

## 2022-09-14 MED ORDER — CEPHALEXIN 500 MG PO CAPS
500.0000 mg | ORAL_CAPSULE | Freq: Two times a day (BID) | ORAL | Status: DC
  Administered 2022-09-14 – 2022-09-16 (×5): 500 mg via ORAL
  Filled 2022-09-14 (×5): qty 1

## 2022-09-14 MED ORDER — SENNOSIDES-DOCUSATE SODIUM 8.6-50 MG PO TABS
2.0000 | ORAL_TABLET | Freq: Two times a day (BID) | ORAL | Status: DC
  Administered 2022-09-14 – 2022-09-16 (×4): 2 via ORAL
  Filled 2022-09-14 (×5): qty 2

## 2022-09-14 NOTE — Progress Notes (Signed)
[facility]                                                                               Caleb House, is a 72 y.o. male, DOB - 03/27/1950, WGN:[id number] Admit date - 09/11/2022    Outpatient Primary MD for the patient is Toney Reil, Genene Churn, NP  LOS - 1  days    Brief summary   72 y.o. male with medical history significant of CAD S/P  multiple stents, hypertension, chronic systolic heart failure, COPD, came in for persistent hematuria. CT renal study showing. 4.3 x 3.2 x 2.5 cm lobular mass along the posterior bladder wall eccentric to the right. This could be due to a mass, blood clot or combination. Further evaluation is recommended.  Urology consulted for hematuria, deferred admission to [facility] for evaluation of hematuria and monitoring hemoglobin.  Cardiology consulted for evaluation of continuation of anti platelet agents.    Assessment & Plan    Assessment and Plan:  Hematuria:  Admitted for evaluation by Urology for CBI and monitoring hemoglobin.  Persistent hematuria despite CBI, plan NPO after midnight for clot removal and fulguration.  Holding aspirin and plavix now.  Transfuse to keep hemoglobin greater than 9.  Monitor cbc every 12 hours.  Urine cultures ordered .  No empiric antibiotics at this time.      CAD s/p multiplet stents No chest pain.  Resume coreg and crestor , holding anti platelet agents for hematuria       Stage 3 b ckd Creatinine at baseline      COPD No wheezing heard.      Chronic systolic heart failure Pt appears euvolemic.    Hypertension; Resume coreg 6.25 mg BID.    Migraine headaches After starting isosorbide mononitrate.  Pt refusing hydralazine and isosorbide nitrate at this time.     Estimated body mass index is 24.42 kg/m as calculated from the following:   Height as of this encounter: 5\' 10"  (1.778 m).   Weight as of this encounter: 77.2 kg.  Code Status: full code.  DVT Prophylaxis:  SCDs Start:  09/12/22 1640   Level of Care: Level of care: Med-Surg Family Communication: none at bedside.   Procedures:    Consultants:   Cardiology  Urology.   Antimicrobials:   Anti-infectives (From admission, onward)    Start     Dose/Rate Route Frequency Ordered Stop   09/14/22 1115  cephALEXin (KEFLEX) capsule 500 mg        500 mg Oral Every 12 hours 09/14/22 1028          Medications  Scheduled Meds:  carvedilol  6.25 mg Oral BID WC   cephALEXin  500 mg Oral Q12H   Chlorhexidine Gluconate Cloth  6 each Topical Daily   polyethylene glycol  17 g Oral Daily   rosuvastatin  40 mg Oral Daily   senna-docusate  2 tablet Oral BID   Continuous Infusions:  sodium chloride irrigation     PRN Meds:.acetaminophen, mouth rinse, oxyCODONE-acetaminophen    Subjective:   Caleb House was seen and examined today. Constipated. Senna, colace and miralax added.   Objective:   Vitals:  09/13/22 1647 09/13/22 2044 09/14/22 0659 09/14/22 1000  BP: 129/80 123/78 130/73 135/76  Pulse: 69 72 67 74  Resp: 18   17  Temp: 97.8 F (36.6 C) 97.9 F (36.6 C) 98.3 F (36.8 C) 98.4 F (36.9 C)  TempSrc: Oral Oral Oral   SpO2: 99% 99% 98% 97%  Weight:      Height:        Intake/Output Summary (Last 24 hours) at 09/14/2022 1657 Last data filed at 09/14/2022 0930 Gross per 24 hour  Intake 9180 ml  Output 96045 ml  Net -5420 ml   Filed Weights   09/12/22 0010 09/12/22 0013 09/12/22 1639  Weight: 79 kg 79 kg 77.2 kg     Exam General exam: Appears calm and comfortable  Respiratory system: Clear to auscultation. Respiratory effort normal. Cardiovascular system: S1 & S2 heard, RRR. No JVD, Gastrointestinal system: Abdomen is nondistended, soft and nontender.  Central nervous system: Alert and oriented. No focal neurological deficits. Extremities: Symmetric 5 x 5 power. Skin: No rashes, lesions or ulcers Psychiatry: Mood & affect appropriate.      Data Reviewed:  I have  personally reviewed following labs and imaging studies   CBC Lab Results  Component Value Date   WBC 9.7 09/14/2022   RBC 3.77 (L) 09/14/2022   HGB 10.8 (L) 09/14/2022   HCT 33.6 (L) 09/14/2022   MCV 89.1 09/14/2022   MCH 28.6 09/14/2022   PLT 168 09/14/2022   MCHC 32.1 09/14/2022   RDW 13.7 09/14/2022   LYMPHSABS 0.8 09/14/2022   MONOABS 1.1 (H) 09/14/2022   EOSABS 0.1 09/14/2022   BASOSABS 0.0 09/14/2022     Last metabolic panel Lab Results  Component Value Date   NA 133 (L) 09/14/2022   K 3.9 09/14/2022   CL 103 09/14/2022   CO2 22 09/14/2022   BUN 28 (H) 09/14/2022   CREATININE 2.19 (H) 09/14/2022   GLUCOSE 120 (H) 09/14/2022   GFRNONAA 31 (L) 09/14/2022   GFRAA 48 (L) 01/07/2020   CALCIUM 8.6 (L) 09/14/2022   PROT 6.0 09/14/2020   ALBUMIN 3.9 09/14/2020   BILITOT 0.5 09/14/2020   ALKPHOS 84 09/14/2020   AST 17 09/14/2020   ALT 13 09/14/2020   ANIONGAP 8 09/14/2022    CBG (last 3)  No results for input(s): "GLUCAP" in the last 72 hours.    Coagulation Profile: No results for input(s): "INR", "PROTIME" in the last 168 hours.   Radiology Studies: No results found.     Kathlen Mody M.D. [facility] 09/14/2022, 4:57 PM  Available via Epic secure chat 7am-7pm After 7 pm, please refer to night coverage provider listed on amion.

## 2022-09-14 NOTE — Progress Notes (Signed)
     Subjective: Patient resting in bed with CBI running at medium gtt.  No acute events overnight.  Objective: Vital signs in last 24 hours: Temp:  [97.8 F (36.6 C)-98.4 F (36.9 C)] 98.4 F (36.9 C) (08/23 1000) Pulse Rate:  [67-74] 74 (08/23 1000) Resp:  [17-18] 17 (08/23 1000) BP: (123-135)/(73-80) 135/76 (08/23 1000) SpO2:  [97 %-99 %] 97 % (08/23 1000)  Assessment/Plan: # Hematuria Trend H&H CT A/P shows mass along the posterior border.  May represent clot, tumor, or both.  Patient will need outpatient cystoscopy. Cardiology has seen.  Lifelong dual antiplatelet therapy preferred.  Plavix on hold, ASA 81 to be continued per cardiology.  Will challenge Plavix once hematuria has resolved. No improvement at day three. NPO at midnight. Consider clot evac and fulguration tomorrow  Intake/Output from previous day: 08/22 0701 - 08/23 0700 In: 9420 [P.O.:420] Out: 29562 [Urine:15525]  Intake/Output this shift: Total I/O In: -  Out: 4900 [Urine:4900]  Physical Exam:  General: Alert and oriented CV: No cyanosis Lungs: equal chest rise Gu: 59f three-way hematuria catheter in place.  CBI on medium gtt.  Dark pink irrigant with some clot material.  Lab Results: Recent Labs    09/13/22 0724 09/13/22 2028 09/14/22 0726  HGB 12.5* 11.4* 10.8*  HCT 38.7* 34.9* 33.6*   BMET Recent Labs    09/12/22 0034 09/14/22 0726  NA 138 133*  K 3.8 3.9  CL 106 103  CO2 19* 22  GLUCOSE 88 120*  BUN 21 28*  CREATININE 1.85* 2.19*  CALCIUM 9.3 8.6*     Studies/Results: No results found.    LOS: 1 day   Elmon Kirschner, NP [facility] Pager: [phone]  09/14/2022, 2:51 PM

## 2022-09-15 DIAGNOSIS — I10 Essential (primary) hypertension: Secondary | ICD-10-CM | POA: Diagnosis not present

## 2022-09-15 DIAGNOSIS — R310 Gross hematuria: Secondary | ICD-10-CM | POA: Diagnosis not present

## 2022-09-15 LAB — CBC WITH DIFFERENTIAL/PLATELET
Abs Immature Granulocytes: 0.02 10*3/uL (ref 0.00–0.07)
Abs Immature Granulocytes: 0.03 10*3/uL (ref 0.00–0.07)
Basophils Absolute: 0 10*3/uL (ref 0.0–0.1)
Basophils Absolute: 0.1 10*3/uL (ref 0.0–0.1)
Basophils Relative: 0 %
Basophils Relative: 1 %
Eosinophils Absolute: 0.1 10*3/uL (ref 0.0–0.5)
Eosinophils Absolute: 0.1 10*3/uL (ref 0.0–0.5)
Eosinophils Relative: 1 %
Eosinophils Relative: 2 %
HCT: 32.2 % — ABNORMAL LOW (ref 39.0–52.0)
HCT: 32.7 % — ABNORMAL LOW (ref 39.0–52.0)
Hemoglobin: 10.4 g/dL — ABNORMAL LOW (ref 13.0–17.0)
Hemoglobin: 10.6 g/dL — ABNORMAL LOW (ref 13.0–17.0)
Immature Granulocytes: 0 %
Immature Granulocytes: 1 %
Lymphocytes Relative: 10 %
Lymphocytes Relative: 8 %
Lymphs Abs: 0.8 10*3/uL (ref 0.7–4.0)
Lymphs Abs: 0.5 10*3/uL — ABNORMAL LOW (ref 0.7–4.0)
MCH: 29.5 pg (ref 26.0–34.0)
MCH: 29.4 pg (ref 26.0–34.0)
MCHC: 32.3 g/dL (ref 30.0–36.0)
MCHC: 32.4 g/dL (ref 30.0–36.0)
MCV: 91.2 fL (ref 80.0–100.0)
MCV: 90.6 fL (ref 80.0–100.0)
Monocytes Absolute: 0.9 10*3/uL (ref 0.1–1.0)
Monocytes Absolute: 0.6 10*3/uL (ref 0.1–1.0)
Monocytes Relative: 12 %
Monocytes Relative: 11 %
Neutro Abs: 5.9 10*3/uL (ref 1.7–7.7)
Neutro Abs: 4.7 10*3/uL (ref 1.7–7.7)
Neutrophils Relative %: 77 %
Neutrophils Relative %: 77 %
Platelets: 189 10*3/uL (ref 150–400)
Platelets: 184 10*3/uL (ref 150–400)
RBC: 3.53 MIL/uL — ABNORMAL LOW (ref 4.22–5.81)
RBC: 3.61 MIL/uL — ABNORMAL LOW (ref 4.22–5.81)
RDW: 13.5 % (ref 11.5–15.5)
RDW: 13.4 % (ref 11.5–15.5)
WBC: 7.7 10*3/uL (ref 4.0–10.5)
WBC: 6 10*3/uL (ref 4.0–10.5)
nRBC: 0 % (ref 0.0–0.2)
nRBC: 0 % (ref 0.0–0.2)

## 2022-09-15 LAB — BASIC METABOLIC PANEL
Anion gap: 8 (ref 5–15)
BUN: 29 mg/dL — ABNORMAL HIGH (ref 8–23)
CO2: 22 mmol/L (ref 22–32)
Calcium: 8.7 mg/dL — ABNORMAL LOW (ref 8.9–10.3)
Chloride: 104 mmol/L (ref 98–111)
Creatinine, Ser: 2.19 mg/dL — ABNORMAL HIGH (ref 0.61–1.24)
GFR, Estimated: 31 mL/min — ABNORMAL LOW (ref >60)
Glucose, Bld: 106 mg/dL — ABNORMAL HIGH (ref 70–99)
Potassium: 4 mmol/L (ref 3.5–5.1)
Sodium: 134 mmol/L — ABNORMAL LOW (ref 135–145)

## 2022-09-15 MED ORDER — TAMSULOSIN HCL 0.4 MG PO CAPS
0.4000 mg | ORAL_CAPSULE | Freq: Every day | ORAL | Status: DC
  Administered 2022-09-15: 0.4 mg via ORAL
  Filled 2022-09-15: qty 1

## 2022-09-15 NOTE — Progress Notes (Addendum)
S: Patient reports urine much clearer today.  He passed a few clots in the catheter last night and it is cleared up.  O: In bed watching TV, alert and oriented GU-Foley catheter in place.  A very slow CBI drip.  1 drop every few seconds.  Urine is clear.  Assessment/plan: 72 year old with a history of mild BPH and elevated PSA.  Now with gross hematuria-I reviewed his CT scan.  That is likely a small clot in the bladder, but discussed importance of cystoscopy outpatient.  -Regular diet -D/C CBI -Consider voiding trial in the morning and restarting of Plavix. -Discussed with the patient and daughter importance of follow-up with Dr. Apolinar Junes for cystoscopy.

## 2022-09-15 NOTE — Plan of Care (Signed)
  Problem: Clinical Measurements: Goal: Ability to maintain clinical measurements within normal limits will improve Outcome: Progressing Goal: Will remain free from infection Outcome: Progressing Goal: Respiratory complications will improve Outcome: Progressing   

## 2022-09-15 NOTE — Plan of Care (Signed)
  Problem: Clinical Measurements: Goal: Respiratory complications will improve Outcome: Progressing Goal: Cardiovascular complication will be avoided Outcome: Progressing   Problem: Activity: Goal: Risk for activity intolerance will decrease Outcome: Progressing   Problem: Nutrition: Goal: Adequate nutrition will be maintained Outcome: Progressing   Problem: Coping: Goal: Level of anxiety will decrease Outcome: Progressing   Problem: Elimination: Goal: Will not experience complications related to bowel motility Outcome: Progressing   Problem: Pain Managment: Goal: General experience of comfort will improve Outcome: Progressing   

## 2022-09-15 NOTE — Progress Notes (Signed)
[facility]                                                                               Caleb House, is a 72 y.o. male, DOB - 1950-05-20, VHQ:[id number] Admit date - 09/11/2022    Outpatient Primary MD for the patient is Toney Reil, Genene Churn, NP  LOS - 2  days    Brief summary   72 y.o. male with medical history significant of CAD S/P  multiple stents, hypertension, chronic systolic heart failure, COPD, came in for persistent hematuria. CT renal study showing. 4.3 x 3.2 x 2.5 cm lobular mass along the posterior bladder wall eccentric to the right. This could be due to a mass, blood clot or combination. Further evaluation is recommended.  Urology consulted for hematuria, deferred admission to [facility] for evaluation of hematuria and monitoring hemoglobin.  Cardiology consulted for evaluation of continuation of anti platelet agents.    Assessment & Plan    Assessment and Plan:  Hematuria:  Admitted for evaluation by Urology for CBI and monitoring hemoglobin.  Hematuria improving. Urology to discontinue the CBI, possible voiding trial in am.  Holding aspirin and plavix now.  Transfuse to keep hemoglobin greater than 9.  Monitor cbc every 12 hours.  Urine cultures ordered showed 20,000 E coli. Short course of keflex to complete.       CAD s/p multiplet stents No chest pain.  Resume coreg and crestor , holding anti platelet agents for hematuria       Stage 3 b ckd Creatinine at baseline and stable.      COPD No wheezing heard.      Chronic systolic heart failure Pt appears euvolemic.    Hypertension; Well controlled.  Resume coreg 6.25 mg BID.    Migraine headaches After starting isosorbide mononitrate.  Pt refusing hydralazine and isosorbide nitrate at this time.  Appears to have resolved.     Estimated body mass index is 24.42 kg/m as calculated from the following:   Height as of this encounter: 5\' 10"  (1.778 m).   Weight as of this encounter: 77.2  kg.  Code Status: full code.  DVT Prophylaxis:  SCDs Start: 09/12/22 1640   Level of Care: Level of care: Med-Surg Family Communication: none at bedside.   Procedures:    Consultants:   Cardiology  Urology.   Antimicrobials:   Anti-infectives (From admission, onward)    Start     Dose/Rate Route Frequency Ordered Stop   09/14/22 1115  cephALEXin (KEFLEX) capsule 500 mg        500 mg Oral Every 12 hours 09/14/22 1028          Medications  Scheduled Meds:  carvedilol  6.25 mg Oral BID WC   cephALEXin  500 mg Oral Q12H   Chlorhexidine Gluconate Cloth  6 each Topical Daily   polyethylene glycol  17 g Oral Daily   rosuvastatin  40 mg Oral Daily   senna-docusate  2 tablet Oral BID   tamsulosin  0.4 mg Oral QPC supper   Continuous Infusions:   PRN Meds:.acetaminophen, mouth rinse, oxyCODONE-acetaminophen    Subjective:   Caleb House was seen and examined  today. No new complaints.   Objective:   Vitals:   09/14/22 1944 09/15/22 0500 09/15/22 0924 09/15/22 0956  BP: 112/64 118/66 129/69   Pulse: 83 79 67   Resp: 18 20 18    Temp: 98.5 F (36.9 C) 98.3 F (36.8 C)  98.3 F (36.8 C)  TempSrc: Oral Oral    SpO2: 100% 100% 96%   Weight:      Height:        Intake/Output Summary (Last 24 hours) at 09/15/2022 1553 Last data filed at 09/15/2022 1246 Gross per 24 hour  Intake 4800 ml  Output 6350 ml  Net -1550 ml   Filed Weights   09/12/22 0010 09/12/22 0013 09/12/22 1639  Weight: 79 kg 79 kg 77.2 kg     Exam General exam: Appears calm and comfortable  Respiratory system: Clear to auscultation. Respiratory effort normal. Cardiovascular system: S1 & S2 heard, RRR. No JVD, murmurs, rubs, gallops or clicks. No pedal edema. Gastrointestinal system: Abdomen is nondistended, soft and nontender. No organomegaly or masses felt. Normal bowel sounds heard. Central nervous system: Alert and oriented. No focal neurological deficits. Extremities: Symmetric 5 x 5  power. Skin: No rashes, lesions or ulcers Psychiatry: Judgement and insight appear normal. Mood & affect appropriate.       Data Reviewed:  I have personally reviewed following labs and imaging studies   CBC Lab Results  Component Value Date   WBC 7.7 09/15/2022   RBC 3.53 (L) 09/15/2022   HGB 10.4 (L) 09/15/2022   HCT 32.2 (L) 09/15/2022   MCV 91.2 09/15/2022   MCH 29.5 09/15/2022   PLT 189 09/15/2022   MCHC 32.3 09/15/2022   RDW 13.5 09/15/2022   LYMPHSABS 0.8 09/15/2022   MONOABS 0.9 09/15/2022   EOSABS 0.1 09/15/2022   BASOSABS 0.0 09/15/2022     Last metabolic panel Lab Results  Component Value Date   NA 134 (L) 09/15/2022   K 4.0 09/15/2022   CL 104 09/15/2022   CO2 22 09/15/2022   BUN 29 (H) 09/15/2022   CREATININE 2.19 (H) 09/15/2022   GLUCOSE 106 (H) 09/15/2022   GFRNONAA 31 (L) 09/15/2022   GFRAA 48 (L) 01/07/2020   CALCIUM 8.7 (L) 09/15/2022   PROT 6.0 09/14/2020   ALBUMIN 3.9 09/14/2020   BILITOT 0.5 09/14/2020   ALKPHOS 84 09/14/2020   AST 17 09/14/2020   ALT 13 09/14/2020   ANIONGAP 8 09/15/2022    CBG (last 3)  No results for input(s): "GLUCAP" in the last 72 hours.    Coagulation Profile: No results for input(s): "INR", "PROTIME" in the last 168 hours.   Radiology Studies: No results found.     Kathlen Mody M.D. [facility] 09/15/2022, 3:53 PM  Available via Epic secure chat 7am-7pm After 7 pm, please refer to night coverage provider listed on amion.

## 2022-09-16 DIAGNOSIS — I10 Essential (primary) hypertension: Secondary | ICD-10-CM | POA: Diagnosis not present

## 2022-09-16 DIAGNOSIS — R310 Gross hematuria: Secondary | ICD-10-CM | POA: Diagnosis not present

## 2022-09-16 LAB — CBC WITH DIFFERENTIAL/PLATELET
Abs Immature Granulocytes: 0.02 10*3/uL (ref 0.00–0.07)
Basophils Absolute: 0 10*3/uL (ref 0.0–0.1)
Basophils Relative: 1 %
Eosinophils Absolute: 0.2 10*3/uL (ref 0.0–0.5)
Eosinophils Relative: 3 %
HCT: 34.1 % — ABNORMAL LOW (ref 39.0–52.0)
Hemoglobin: 11 g/dL — ABNORMAL LOW (ref 13.0–17.0)
Immature Granulocytes: 0 %
Lymphocytes Relative: 13 %
Lymphs Abs: 0.7 10*3/uL (ref 0.7–4.0)
MCH: 28.3 pg (ref 26.0–34.0)
MCHC: 32.3 g/dL (ref 30.0–36.0)
MCV: 87.7 fL (ref 80.0–100.0)
Monocytes Absolute: 0.6 10*3/uL (ref 0.1–1.0)
Monocytes Relative: 13 %
Neutro Abs: 3.5 10*3/uL (ref 1.7–7.7)
Neutrophils Relative %: 70 %
Platelets: 202 10*3/uL (ref 150–400)
RBC: 3.89 MIL/uL — ABNORMAL LOW (ref 4.22–5.81)
RDW: 13.4 % (ref 11.5–15.5)
WBC: 5 10*3/uL (ref 4.0–10.5)
nRBC: 0 % (ref 0.0–0.2)

## 2022-09-16 MED ORDER — TAMSULOSIN HCL 0.4 MG PO CAPS: 0.4000 mg | ORAL_CAPSULE | Freq: Every day | ORAL | 0 refills | Status: DC

## 2022-09-16 MED ORDER — CEPHALEXIN 500 MG PO CAPS: 500.0000 mg | ORAL_CAPSULE | Freq: Two times a day (BID) | ORAL | 0 refills | Status: AC

## 2022-09-16 MED ORDER — SENNOSIDES-DOCUSATE SODIUM 8.6-50 MG PO TABS: 2.0000 | ORAL_TABLET | Freq: Every evening | ORAL | 0 refills | Status: AC | PRN

## 2022-09-16 NOTE — Progress Notes (Signed)
Patient has voided 3 time color co9ntinures to lighten first has 4-5 small stringy clots iother 2 had single very small stringy clot.

## 2022-09-16 NOTE — Discharge Summary (Signed)
Physician Discharge Summary   Patient: Caleb House MRN: [id number] DOB: 01-02-1951  Admit date:     09/11/2022  Discharge date: 09/16/22  Discharge Physician: Kathlen Mody   PCP: Eden Emms, NP   Recommendations at discharge:  Please follow up with PCP in one week.  Please follow up with urology as recommended for outpatient cystoscopy.  Please check cbc and bmp in one week.   Discharge Diagnoses: Principal Problem:   Hematuria    Hospital Course:   72 y.o. male with medical history significant of CAD S/P  multiple stents, hypertension, chronic systolic heart failure, COPD, came in for persistent hematuria. CT renal study showing. 4.3 x 3.2 x 2.5 cm lobular mass along the posterior bladder wall eccentric to the right. This could be due to a mass, blood clot or combination. Further evaluation is recommended.   Urology consulted for hematuria, deferred admission to [facility] for evaluation of hematuria and monitoring hemoglobin.  Cardiology consulted for evaluation of continuation of anti platelet agents.    Assessment and Plan:   Hematuria:  Admitted for evaluation by Urology for CBI and monitoring hemoglobin.  Hematuria improved. CBI discontinued  and foley removed.  Urine has cleared up.  Urology recommending outpatient follow up for cystoscopy.   Urine cultures ordered showed 20,000 E coli. Short course of keflex to complete.        CAD s/p multiplet stents No chest pain.  Resume coreg and crestor , holding anti platelet agents for hematuria        Stage 3 b ckd Creatinine at baseline and stable.      COPD No wheezing heard.      Chronic systolic heart failure Pt appears euvolemic.    Hypertension; Well controlled.  Resume coreg 6.25 mg BID.    Migraine headaches After starting isosorbide mononitrate.  Pt refusing hydralazine and isosorbide nitrate at this time.  Migraine headaches Appear to have resolved.      Estimated body mass index is 24.42  kg/m as calculated from the following:   Height as of this encounter: 5\' 10"  (1.778 m).   Weight as of this encounter: 77.2 kg.      Consultants: urology  cardiology Procedures performed: cBI Disposition: Home Diet recommendation:  Discharge Diet Orders (From admission, onward)     Start     Ordered   09/16/22 0000  Diet - low sodium heart healthy        09/16/22 1418           Regular diet DISCHARGE MEDICATION: Allergies as of 09/16/2022       Reactions   Brilinta [ticagrelor] Other (See Comments)   Pt states difficulty breathing   Entresto [sacubitril-valsartan] Other (See Comments)   hyperkalemia   Isosorbide Mononitrate [isosorbide Nitrate] Other (See Comments)   headache   Spironolactone    Hyperkalemia Pt states he does not remember.        Medication List     STOP taking these medications    hydrALAZINE 10 MG tablet Commonly known as: APRESOLINE   isosorbide mononitrate 30 MG 24 hr tablet Commonly known as: IMDUR       TAKE these medications    acetaminophen 500 MG tablet Commonly known as: TYLENOL Take 500 mg by mouth every 6 (six) hours as needed.   aspirin EC 81 MG tablet Take 81 mg by mouth daily. Swallow whole.   carvedilol 6.25 MG tablet Commonly known as: COREG TAKE 1 TABLET BY MOUTH TWICE A  DAY   cephALEXin 500 MG capsule Commonly known as: KEFLEX Take 1 capsule (500 mg total) by mouth every 12 (twelve) hours for 2 days.   clopidogrel 75 MG tablet Commonly known as: PLAVIX TAKE 1 TABLET BY MOUTH EVERY DAY   IMMUNE SUPPORT PO Take 1 tablet by mouth in the morning and at bedtime.   rosuvastatin 40 MG tablet Commonly known as: CRESTOR Take 1 tablet (40 mg total) by mouth daily.   senna-docusate 8.6-50 MG tablet Commonly known as: Senokot-S Take 2 tablets by mouth at bedtime as needed for up to 7 days for mild constipation.   tamsulosin 0.4 MG Caps capsule Commonly known as: FLOMAX Take 1 capsule (0.4 mg total) by  mouth daily after supper.        Follow-up Information     Eden Emms, NP .   Specialties: Nurse Practitioner, Family Medicine Contact information: [address] [place] [address] [phone]         Vanna Scotland, MD. Call.   Specialty: Urology Why: for a look in your bladder (cystoscopy) Contact information: [address] [place] [address] [phone]                Discharge Exam: Ceasar Mons Weights   09/12/22 0010 09/12/22 0013 09/12/22 1639  Weight: 79 kg 79 kg 77.2 kg   General exam: Appears calm and comfortable  Respiratory system: Clear to auscultation. Respiratory effort normal. Cardiovascular system: S1 & S2 heard, RRR. No JVD, murmurs, rubs, gallops or clicks. No pedal edema. Gastrointestinal system: Abdomen is nondistended, soft and nontender. No organomegaly or masses felt. Normal bowel sounds heard. Central nervous system: Alert and oriented. No focal neurological deficits. Extremities: Symmetric 5 x 5 power. Skin: No rashes, lesions or ulcers Psychiatry: Judgement and insight appear normal. Mood & affect appropriate.    Condition at discharge: fair  The results of significant diagnostics from this hospitalization (including imaging, microbiology, ancillary and laboratory) are listed below for reference.   Imaging Studies: CT Renal Stone Study  Result Date: 09/12/2022 CLINICAL DATA:  Hematuria, abdominal and flank pain. EXAM: CT ABDOMEN AND PELVIS WITHOUT CONTRAST TECHNIQUE: Multidetector CT imaging of the abdomen and pelvis was performed following the standard protocol without IV contrast. RADIATION DOSE REDUCTION: This exam was performed according to the departmental dose-optimization program which includes automated exposure control, adjustment of the mA and/or kV according to patient size and/or use of iterative reconstruction technique. COMPARISON:  Limited comparison is available chest CTs without contrast  from 12/07/2021 and 12/07/2020 FINDINGS: Lower chest: Lung bases are emphysematous but clear. There is a small hiatal hernia. The cardiac size is normal. There are coronary artery calcifications. Hepatobiliary: There are lobular changes to the undersurface of the liver which could suggest early cirrhosis. The unenhanced liver is otherwise unremarkable. There are small stones in the proximal gallbladder but no wall thickening or biliary dilatation. Pancreas: Moderately atrophic and otherwise unremarkable without contrast. Spleen: Unremarkable without contrast. Adrenals/Urinary Tract: There is no adrenal mass. On the right, there is a homogeneous cyst measuring 3.5 cm, Hounsfield density is 0.9, in the upper pole. There is a 3.1 cm homogeneous parapelvic cyst in the right mid to lower pole, Hounsfield density is 3.6. On the left, a 1.6 cm cyst is noted medially in the superior pole, Hounsfield density is 11.7. Larger 3.5 cm cyst arises from the lateral midpole, Hounsfield density is 5.6. No follow-up imaging is recommended for the cysts. No other contour  deforming renal abnormality is seen. There is no urinary stone or obstruction. Perinephric stranding is similar to both prior studies. There is a lobular mass along the posterior bladder wall eccentric to the right measuring 4.3 x 3.2 cm on 3:69, 2.5 cm in height on 7:94. This could all be due to a blood clot or could be due to a combination of a blood clot and an underlying mass, or could all be due to a mass. Further evaluation is recommended. Stomach/Bowel: No dilatation or wall thickening including the appendix. There is mild fecal stasis. Diffuse diverticulosis greatest in the sigmoid, without evidence of acute diverticulitis. Vascular/Lymphatic: There is aortoiliac and visceral branch arterial calcific plaque. No AAA. No adenopathy is seen. Reproductive: Enlarged prostate, 5.2 cm transverse. Mild bladder base impression. Other: Small umbilical fat hernia. No  incarcerated hernia. There are small inguinal fat hernias. There is no free air, free fluid or free hemorrhage. Musculoskeletal: There is osteopenia and mild degenerative change lumbar spine. No acute or other significant osseous findings or suspicious bone lesion. IMPRESSION: 1. 4.3 x 3.2 x 2.5 cm lobular mass along the posterior bladder wall eccentric to the right. This could be due to a mass, blood clot or combination. Further evaluation is recommended. 2. Prostatomegaly with mild bladder base impression. 3. Bilateral renal cysts.  No follow-up imaging recommended. 4. Cholelithiasis. 5. Lobular changes to the undersurface of the liver which could suggest early cirrhosis. 6. Aortic and coronary artery atherosclerosis. 7. Emphysema. 8. Constipation and diverticulosis. 9. Umbilical and inguinal fat hernias. 10. Osteopenia and degenerative change. Aortic Atherosclerosis (ICD10-I70.0) and Emphysema (ICD10-J43.9). Electronically Signed   By: Almira Bar M.D.   On: 09/12/2022 04:06   ECHOCARDIOGRAM COMPLETE  Result Date: 08/22/2022    ECHOCARDIOGRAM REPORT   Patient Name:   RAYWOOD AXON Date of Exam: 08/22/2022 Medical Rec #:  [id number]     Height:       70.0 in Accession #:    [id number]    Weight:       176.0 lb Date of Birth:  1950/12/28     BSA:          1.977 m Patient Age:    71 years      BP:           137/81 mmHg Patient Gender: M             HR:           53 bpm. Exam Location:  [address] Procedure: 2D Echo, Cardiac Doppler, Color Doppler and Intracardiac            Opacification Agent Indications:    I25.5 Ischemic Cardiomyopathy  History:        Patient has prior history of Echocardiogram examinations, most                 recent 06/15/2020. CAD, Carotid Disease; Signs/Symptoms:Shortness                 of Breath and Dizziness/Lightheadedness.  Sonographer:    Clearence Ped RCS Referring Phys: [id number] JAMES HOCHREIN IMPRESSIONS  1. 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 There is no left ventricular thrombus. Left ventricular ejection fraction, by estimation, is 40 to 45%. The left ventricle has mildly decreased function. The left ventricle demonstrates regional wall motion abnormalities (see scoring diagram/findings for description). Left ventricular diastolic parameters are indeterminate.  2. Right ventricular systolic function is normal. The right  ventricular size is normal. There is normal pulmonary artery systolic pressure. The estimated right ventricular systolic pressure is 16.1 mmHg.  3. The mitral valve is normal in structure. Trivial mitral valve regurgitation. No evidence of mitral stenosis.  4. The aortic valve is tricuspid. Aortic valve regurgitation is mild. No aortic stenosis is present.  5. The inferior vena cava is normal in size with greater than 50% respiratory variability, suggesting right atrial pressure of 3 mmHg. Comparison(s): Prior images reviewed side by side. Aneurysmal changes of the LV apex are better delineated with Definity contrast, but there is no significant change. FINDINGS  Left Ventricle: Moderate aneurysmal anteroapical changes with thinned myocardium without microbubblle contrast uptake, consistent with scar. There is no left ventricular thrombus. Left ventricular ejection fraction, by estimation, is 40 to 45%. The left  ventricle has mildly decreased function. The left ventricle demonstrates regional wall motion abnormalities. The left ventricular internal cavity size was normal in size. There is no left ventricular hypertrophy. Left ventricular diastolic parameters are indeterminate. Normal left ventricular filling pressure.  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 The mid anteroseptal segment and apical lateral segment are akinetic. The anterior wall, antero-lateral wall, inferior wall, posterior wall,  basal anteroseptal segment, mid inferoseptal segment, and basal inferoseptal segment are normal. Right Ventricle: The right ventricular size is normal. No increase in right ventricular wall thickness. Right ventricular systolic function is normal. There is normal pulmonary artery systolic pressure. The tricuspid regurgitant velocity is 1.81 m/s, and  with an assumed right atrial pressure of 3 mmHg, the estimated right ventricular systolic pressure is 16.1 mmHg. Left Atrium: Left atrial size was normal in size. Right Atrium: Right atrial size was normal in size. Pericardium: There is no evidence of pericardial effusion. Mitral Valve: The mitral valve is normal in structure. Trivial mitral valve regurgitation. No evidence of mitral valve stenosis. Tricuspid Valve: The tricuspid valve is normal in structure. Tricuspid valve regurgitation is trivial. No evidence of tricuspid stenosis. Aortic Valve: The aortic valve is tricuspid. Aortic valve regurgitation is mild. Aortic regurgitation PHT measures 1134 msec. No aortic stenosis is present. Pulmonic Valve: The pulmonic valve was normal in structure. Pulmonic valve regurgitation is not visualized. No evidence of pulmonic stenosis. Aorta: The aortic root is normal in size and structure. Venous: The inferior vena cava is normal in size with greater than 50% respiratory variability, suggesting right atrial pressure of 3 mmHg. IAS/Shunts: No atrial level shunt detected by color flow Doppler.  LEFT VENTRICLE PLAX 2D LVIDd:         4.80 cm   Diastology LVIDs:         3.40 cm   LV e' medial:    6.20 cm/s LV PW:         0.80 cm   LV E/e' medial:  9.3 LV IVS:        0.80 cm   LV e' lateral:   7.07 cm/s LVOT diam:     2.10 cm   LV E/e' lateral: 8.1 LV SV:         67 LV SV Index:   34 LVOT Area:     3.46 cm  RIGHT VENTRICLE RV Basal diam:  3.30 cm RV S prime:     9.57 cm/s TAPSE (M-mode): 1.9 cm RVSP:           16.1 mmHg LEFT ATRIUM  Index        RIGHT ATRIUM            Index LA diam:        2.90 cm 1.47 cm/m   RA Pressure: 3.00 mmHg LA Vol (A2C):   55.7 ml 28.17 ml/m  RA Area:     17.60 cm LA Vol (A4C):   43.8 ml 22.16 ml/m  RA Volume:   51.80 ml  26.20 ml/m LA Biplane Vol: 51.4 ml 26.00 ml/m  AORTIC VALVE LVOT Vmax:   68.70 cm/s LVOT Vmean:  48.900 cm/s LVOT VTI:    0.192 m AI PHT:      1134 msec  AORTA Ao Root diam: 3.80 cm Ao Asc diam:  3.40 cm MITRAL VALVE               TRICUSPID VALVE MV Area (PHT): 1.94 cm    TR Peak grad:   13.1 mmHg MV Decel Time: 391 msec    TR Vmax:        181.00 cm/s MV E velocity: 57.40 cm/s  Estimated RAP:  3.00 mmHg MV A velocity: 55.30 cm/s  RVSP:           16.1 mmHg MV E/A ratio:  1.04                            SHUNTS                            Systemic VTI:  0.19 m                            Systemic Diam: 2.10 cm Rachelle Hora Croitoru MD Electronically signed by Thurmon Fair MD Signature Date/Time: 08/22/2022/1:08:30 PM    Final     Microbiology: Results for orders placed or performed during the hospital encounter of 09/11/22  Urine Culture     Status: Abnormal   Collection Time: 09/12/22 12:12 AM   Specimen: Urine, Clean Catch  Result Value Ref Range Status   Specimen Description URINE, CLEAN CATCH  Final   Special Requests   Final    NONE Performed at [facility], [address] [place], [place] [address]    Culture 20,000 COLONIES/mL ESCHERICHIA COLI (A)  Final   Report Status 09/14/2022 FINAL  Final   Organism ID, Bacteria ESCHERICHIA COLI (A)  Final      Susceptibility   Escherichia coli - MIC*    AMPICILLIN >=32 RESISTANT Resistant     CEFAZOLIN <=4 SENSITIVE Sensitive     CEFEPIME <=0.12 SENSITIVE Sensitive     CEFTRIAXONE <=0.25 SENSITIVE Sensitive     CIPROFLOXACIN <=0.25 SENSITIVE Sensitive     GENTAMICIN <=1 SENSITIVE Sensitive     IMIPENEM <=0.25 SENSITIVE Sensitive     NITROFURANTOIN <=16 SENSITIVE Sensitive     TRIMETH/SULFA <=20 SENSITIVE Sensitive     AMPICILLIN/SULBACTAM 16 INTERMEDIATE  Intermediate     PIP/TAZO <=4 SENSITIVE Sensitive     * 20,000 COLONIES/mL ESCHERICHIA COLI    Labs: CBC: Recent Labs  Lab 09/14/22 0726 09/14/22 1943 09/15/22 0827 09/15/22 1957 09/16/22 0831  WBC 9.7 10.6* 7.7 6.0 5.0  NEUTROABS 7.7 8.3* 5.9 4.7 3.5  HGB 10.8* 10.5* 10.4* 10.6* 11.0*  HCT 33.6* 32.3* 32.2* 32.7* 34.1*  MCV 89.1 88.3 91.2 90.6 87.7  PLT 168 189 189 184 202  Basic Metabolic Panel: Recent Labs  Lab 09/12/22 0034 09/14/22 0726 09/15/22 1146  NA 138 133* 134*  K 3.8 3.9 4.0  CL 106 103 104  CO2 19* 22 22  GLUCOSE 88 120* 106*  BUN 21 28* 29*  CREATININE 1.85* 2.19* 2.19*  CALCIUM 9.3 8.6* 8.7*   Liver Function Tests: No results for input(s): "AST", "ALT", "ALKPHOS", "BILITOT", "PROT", "ALBUMIN" in the last 168 hours. CBG: No results for input(s): "GLUCAP" in the last 168 hours.  Discharge time spent: 39 minutes  Signed: Kathlen Mody, MD [facility] 09/16/2022

## 2022-09-16 NOTE — Progress Notes (Signed)
S: Patient feeling well.  Much less bladder pressure.  Catheter draining well and he reports it is clear.  O: Alert and oriented, watching TV Urine clear in tubing  CBC    Component Value Date/Time   WBC 5.0 09/16/2022 0831   RBC 3.89 (L) 09/16/2022 0831   HGB 11.0 (L) 09/16/2022 0831   HGB 13.2 07/23/2022 1216   HCT 34.1 (L) 09/16/2022 0831   HCT 40.2 07/23/2022 1216   PLT 202 09/16/2022 0831   PLT 206 07/23/2022 1216   MCV 87.7 09/16/2022 0831   MCV 89 07/23/2022 1216   MCH 28.3 09/16/2022 0831   MCHC 32.3 09/16/2022 0831   RDW 13.4 09/16/2022 0831   RDW 13.3 07/23/2022 1216   LYMPHSABS 0.7 09/16/2022 0831   MONOABS 0.6 09/16/2022 0831   EOSABS 0.2 09/16/2022 0831   BASOSABS 0.0 09/16/2022 0831     Assessment: -Gross hematuria, BPH-discussed the nature risk benefits and alternatives to tamsulosin.  He reports he and Dr. Apolinar Junes discussed it and he actually has a prescription at home.  He might consider taking it.  Plan: -DC Foley -Okay to start Plavix from urologic point of view -Follow-up with Dr. Apolinar Junes for cystoscopy.

## 2022-09-18 ENCOUNTER — Telehealth: Payer: Self-pay

## 2022-09-18 NOTE — Transitions of Care (Post Inpatient/ED Visit) (Signed)
09/18/2022  Name: Caleb House MRN: [id number] DOB: Jun 18, 1950  Today's TOC FU Call Status: Today's TOC FU Call Status:: Successful TOC FU Call Completed TOC FU Call Complete Date: 09/18/22 Patient's Name and Date of Birth confirmed.  Transition Care Management Follow-up Telephone Call Discharge Facility: [facility]) Type of Discharge: Inpatient Admission Primary Inpatient Discharge Diagnosis:: Hematuria How have you been since you were released from the hospital?:  (No further hematuria) Any questions or concerns?: No  Items Reviewed: Did you receive and understand the discharge instructions provided?: Yes Medications obtained,verified, and reconciled?: Yes (Medications Reviewed) Any new allergies since your discharge?: No Dietary orders reviewed?: Yes Type of Diet Ordered:: Low Sodium, Heart Healthy Do you have support at home?: Yes People in Home: child(ren), adult Name of Support/Comfort Primary Source: Christine  Medications Reviewed Today: Medications Reviewed Today     Reviewed by Jodelle Gross, RN (Case Manager) on 09/18/22 at 1124  Med List Status: <None>   Medication Order Taking? Sig Documenting Provider Last Dose Status Informant  acetaminophen (TYLENOL) 500 MG tablet [id number] Yes Take 500 mg by mouth every 6 (six) hours as needed. [provider] Taking Active Self, Pharmacy Records  aspirin EC 81 MG tablet [id number] Yes Take 81 mg by mouth daily. Swallow whole. [provider] Taking Active Self, Pharmacy Records  carvedilol (COREG) 6.25 MG tablet [id number] Yes TAKE 1 TABLET BY MOUTH TWICE A Kenn File, MD Taking Active Self, Pharmacy Records  cephALEXin [facility]) 500 MG capsule [id number] Yes Take 1 capsule (500 mg total) by mouth every 12 (twelve) hours for 2 days. Kathlen Mody, MD Taking Active   clopidogrel (PLAVIX) 75 MG tablet [id number] Yes TAKE 1 TABLET BY MOUTH EVERY DAY Rollene Rotunda, MD Taking Active Self, Pharmacy  Records  Multiple Vitamins-Minerals (IMMUNE SUPPORT PO) [id number] Yes Take 1 tablet by mouth in the morning and at bedtime. [provider] Taking Active Self, Pharmacy Records  rosuvastatin (CRESTOR) 40 MG tablet [id number] Yes Take 1 tablet (40 mg total) by mouth daily. Rollene Rotunda, MD Taking Active Self, Pharmacy Records  senna-docusate (SENOKOT-S) 8.6-50 MG tablet [id number] No Take 2 tablets by mouth at bedtime as needed for up to 7 days for mild constipation.  Patient not taking: Reported on 09/18/2022   Kathlen Mody, MD Not Taking Active   tamsulosin [facility]) 0.4 MG CAPS capsule [id number] Yes Take 1 capsule (0.4 mg total) by mouth daily after supper. Kathlen Mody, MD Taking Active             Home Care and Equipment/Supplies: Were Home Health Services Ordered?: No Any new equipment or medical supplies ordered?: No  Functional Questionnaire: Do you need assistance with bathing/showering or dressing?: No Do you need assistance with meal preparation?: No Do you need assistance with eating?: No Do you have difficulty maintaining continence: No Do you need assistance with getting out of bed/getting out of a chair/moving?: No Do you have difficulty managing or taking your medications?: No  Follow up appointments reviewed: PCP Follow-up appointment confirmed?: Yes Date of PCP follow-up appointment?: 09/25/22 Follow-up Provider: Mordecai Maes, NP Specialist Hospital Follow-up appointment confirmed?: Yes Date of Specialist follow-up appointment?: 09/20/22 Follow-Up Specialty Provider:: Dr. Apolinar Junes (urology) Do you need transportation to your follow-up appointment?: No Do you understand care options if your condition(s) worsen?: Yes-patient verbalized understanding  SDOH Interventions Today    Flowsheet Row Most Recent Value  SDOH Interventions   Food Insecurity Interventions Intervention Not Indicated  Housing Interventions Intervention Not Indicated  Transportation  Interventions Intervention Not Indicated  Utilities Interventions Intervention Not Indicated     Jodelle Gross RN, BSN, CCM [facility] RN Care Coordinator/ Transitions of Care Direct Dial: [phone]  Fax: [phone]

## 2022-09-20 ENCOUNTER — Encounter: Payer: Self-pay | Admitting: Urology

## 2022-09-20 ENCOUNTER — Ambulatory Visit (INDEPENDENT_AMBULATORY_CARE_PROVIDER_SITE_OTHER): Payer: Medicare Other | Admitting: Urology

## 2022-09-20 VITALS — BP 136/72 | HR 71 | Ht 70.0 in | Wt 170.0 lb

## 2022-09-20 DIAGNOSIS — R972 Elevated prostate specific antigen [PSA]: Secondary | ICD-10-CM

## 2022-09-20 DIAGNOSIS — R310 Gross hematuria: Secondary | ICD-10-CM | POA: Diagnosis not present

## 2022-09-20 DIAGNOSIS — R351 Nocturia: Secondary | ICD-10-CM

## 2022-09-20 DIAGNOSIS — N401 Enlarged prostate with lower urinary tract symptoms: Secondary | ICD-10-CM

## 2022-09-20 NOTE — Progress Notes (Signed)
   09/20/22  CC:  Chief Complaint  Patient presents with   Cysto    HPI: 72 year old male who presents today for follow-up of gross hematuria episode.  Notably he is a patient of mine being followed for history of elevated PSA which is since normalized.  He underwent a prostate MRI which was fairly unremarkable.  He did have a slightly abnormal rectal exam.  He is due for PSA.  In the interim, he developed gross hematuria and clot retention.  He was admitted to the hospital for this issue.  He underwent CT stone protocol in light of stage IV CKD which showed a large clot or mass in his bladder.  He was managed conservatively with irrigations and CBI and ultimately the bleeding stopped.  He is scheduled for outpatient follow-up with cystoscopy.  He was managed by Dr. Mena Goes during that admission.  He reports that since his discharge, he was advised to resume his Flomax which he has been taking.  He is not sure if this has been helpful.  He reports that prior to all of this, he sometimes had no issues urinating and other times he was getting up every 2 hours at night.  He cannot really assess whether or not it had to do with his hydration status or drink beverages before bedtime.  Blood pressure 136/72, pulse 71, height 5\' 10"  (1.778 m), weight 170 lb (77.1 kg). NED. A&Ox3.   No respiratory distress   Abd soft, NT, ND Normal phallus with bilateral descended testicles  Cystoscopy Procedure Note  Patient identification was confirmed, informed consent was obtained, and patient was prepped using Betadine solution.  Lidocaine jelly was administered per urethral meatus.     Pre-Procedure: - Inspection reveals a normal caliber ureteral meatus.  Procedure: The flexible cystoscope was introduced without difficulty - No urethral strictures/lesions are present. - Enlarged prostate  - Normal bladder neck - Bilateral ureteral orifices identified - Bladder mucosa  reveals no ulcers, tumors.   There were several hemosiderin deposits on the posterior bladder wall including an area that appeared to be slightly erythematous but without Texter Rivest or papillary change. - No bladder stones - Mild trabeculation with single saccule on left lateral bladder wall  Retroflexion shows unremarkable   Post-Procedure: - Patient tolerated the procedure well  Assessment/ Plan:  1. Gross hematuria No identifiable cause of gross hematuria.  He does have some mild prostamegaly and was on Plavix at the time, possibly related to prostate bleeding as patient endorses heavy lifting around the time that happened  Unable to have CT urogram based on abnormal renal function for complete upper tract imaging.  CT stone without evidence of upper tract pathology.  In light of bladder erythema, would recommend repeat urine cystoscopy in about 2 months to ensure resolution.  Suspect this is related to catheter manipulation rather than true underlying bladder pathology. - Urinalysis, Complete  2. Elevated PSA Personal history of elevated PSA, due for repeat.  He will have this done with his primary care a few weeks, if not he will have it done prior to cystoscopy  3. Benign prostatic hyperplasia with nocturia Okay to titrate on and off Flomax if it is truly helping him.  Suspect his urinary symptoms are exacerbated by recent hematuria event.  If they do not improve, consider additional intervention and evaluation.  Repeat cystoscopy in 2 months (possible PSA prior if not completed with PCP)  Vanna Scotland, MD

## 2022-09-21 LAB — URINALYSIS, COMPLETE
Bilirubin, UA: NEGATIVE
Glucose, UA: NEGATIVE
Ketones, UA: NEGATIVE
Leukocytes,UA: NEGATIVE
Nitrite, UA: NEGATIVE
Specific Gravity, UA: 1.015 (ref 1.005–1.030)
Urobilinogen, Ur: 0.2 mg/dL (ref 0.2–1.0)
pH, UA: 5.5 (ref 5.0–7.5)

## 2022-09-21 LAB — MICROSCOPIC EXAMINATION

## 2022-09-25 ENCOUNTER — Ambulatory Visit (INDEPENDENT_AMBULATORY_CARE_PROVIDER_SITE_OTHER): Payer: Medicare Other | Admitting: Nurse Practitioner

## 2022-09-25 ENCOUNTER — Encounter: Payer: Self-pay | Admitting: Nurse Practitioner

## 2022-09-25 VITALS — BP 118/76 | HR 72 | Temp 98.2°F | Ht 71.0 in | Wt 173.0 lb

## 2022-09-25 DIAGNOSIS — N3091 Cystitis, unspecified with hematuria: Secondary | ICD-10-CM

## 2022-09-25 DIAGNOSIS — R509 Fever, unspecified: Secondary | ICD-10-CM

## 2022-09-25 DIAGNOSIS — R351 Nocturia: Secondary | ICD-10-CM | POA: Diagnosis not present

## 2022-09-25 DIAGNOSIS — Z09 Encounter for follow-up examination after completed treatment for conditions other than malignant neoplasm: Secondary | ICD-10-CM | POA: Diagnosis not present

## 2022-09-25 DIAGNOSIS — I2510 Atherosclerotic heart disease of native coronary artery without angina pectoris: Secondary | ICD-10-CM | POA: Diagnosis not present

## 2022-09-25 DIAGNOSIS — Z125 Encounter for screening for malignant neoplasm of prostate: Secondary | ICD-10-CM

## 2022-09-25 DIAGNOSIS — I10 Essential (primary) hypertension: Secondary | ICD-10-CM

## 2022-09-25 LAB — CBC
HCT: 35 % — ABNORMAL LOW (ref 39.0–52.0)
Hemoglobin: 10.9 g/dL — ABNORMAL LOW (ref 13.0–17.0)
MCHC: 31.2 g/dL (ref 30.0–36.0)
MCV: 90.3 fl (ref 78.0–100.0)
Platelets: 317 10*3/uL (ref 150.0–400.0)
RBC: 3.88 Mil/uL — ABNORMAL LOW (ref 4.22–5.81)
RDW: 14.2 % (ref 11.5–15.5)
WBC: 12.2 10*3/uL — ABNORMAL HIGH (ref 4.0–10.5)

## 2022-09-25 LAB — LIPID PANEL
Cholesterol: 81 mg/dL (ref 0–200)
HDL: 27.6 mg/dL — ABNORMAL LOW (ref >39.00)
LDL Cholesterol: 38 mg/dL (ref 0–99)
NonHDL: 53.88
Total CHOL/HDL Ratio: 3
Triglycerides: 81 mg/dL (ref 0.0–149.0)
VLDL: 16.2 mg/dL (ref 0.0–40.0)

## 2022-09-25 LAB — COMPREHENSIVE METABOLIC PANEL
ALT: 25 U/L (ref 0–53)
AST: 15 U/L (ref 0–37)
Albumin: 3.7 g/dL (ref 3.5–5.2)
Alkaline Phosphatase: 60 U/L (ref 39–117)
BUN: 24 mg/dL — ABNORMAL HIGH (ref 6–23)
CO2: 24 meq/L (ref 19–32)
Calcium: 8.8 mg/dL (ref 8.4–10.5)
Chloride: 104 meq/L (ref 96–112)
Creatinine, Ser: 1.58 mg/dL — ABNORMAL HIGH (ref 0.40–1.50)
GFR: 42.99 mL/min — ABNORMAL LOW (ref >60.00)
Glucose, Bld: 129 mg/dL — ABNORMAL HIGH (ref 70–99)
Potassium: 4.7 meq/L (ref 3.5–5.1)
Sodium: 136 meq/L (ref 135–145)
Total Bilirubin: 0.8 mg/dL (ref 0.2–1.2)
Total Protein: 6.1 g/dL (ref 6.0–8.3)

## 2022-09-25 LAB — POCT URINALYSIS DIPSTICK
Bilirubin, UA: NEGATIVE
Blood, UA: POSITIVE
Glucose, UA: NEGATIVE
Ketones, UA: NEGATIVE
Nitrite, UA: POSITIVE
Protein, UA: POSITIVE — AB
Spec Grav, UA: 1.025 (ref 1.010–1.025)
Urobilinogen, UA: 0.2 U/dL
pH, UA: 6 (ref 5.0–8.0)

## 2022-09-25 MED ORDER — CEPHALEXIN 500 MG PO CAPS
500.0000 mg | ORAL_CAPSULE | Freq: Two times a day (BID) | ORAL | 0 refills | Status: DC
Start: 2022-09-25 — End: 2022-11-27

## 2022-09-25 NOTE — Assessment & Plan Note (Signed)
Did review hospital note along with imaging and labs

## 2022-09-25 NOTE — Assessment & Plan Note (Signed)
UA in office along with PSA.  Pending result

## 2022-09-25 NOTE — Assessment & Plan Note (Signed)
History of the same.  Patient has 7 steps followed by cardiology.  Is on clopidogrel and aspirin along with rosuvastatin.  Continue taking medication as prescribed follow-up with specialist as recommended

## 2022-09-25 NOTE — Patient Instructions (Signed)
Nice to see you today I will be in touch with the labs once I have them Follow up with me in 1 year, sooner if you need me   

## 2022-09-25 NOTE — Assessment & Plan Note (Signed)
Patient currently maintained on carvedilol.  Tolerates medication well.  No dizziness or lightheadedness.  Continue medication as prescribed

## 2022-09-25 NOTE — Assessment & Plan Note (Signed)
UA indicative of urinary tract infection.  Will treat with Keflex 500 mg twice daily for a week.  Pending urine culture follow-up if no improvement

## 2022-09-25 NOTE — Progress Notes (Signed)
Established Patient Office Visit  Subjective   Patient ID: Caleb House, male    DOB: [birth year]  Age: 72 y.o. MRN: [id number]  Chief Complaint  Patient presents with   Transitions Of Care    Pt requests lipids blood work and a PSA test. Pt states that he is still feeling tired from ED visit. 8/20.    HPI  Transfer of care: last seen byu Gweneth Dimitri on 09/22/2021  CAD/CHF/Stent placement: states sees every 6 months with    HTN: states that he will check it on oacassion at home and does well   Hostpiral follow up: states that he was admitted for gross hematuria.  He mentions there is no more blood in urine that he could see.  He has been seen by urology and a cystoscopy has been performed has a follow-up in 2 months per his report  States on Sunday he woke up every couple hours to go to the batrhoom. States that he felt like someone put a bag of ice in his chest. States that he his not going to the restorom any more than normal. States that he no longer had the chills.   Immunizations: -Tetanus: Completed in 2022 -Influenza: refused  -Shingles: Completed Shingrix series -Pneumonia: Completed    Eye exam: Completes annually Cataract surgery within the year   Colonoscopy: 10/06/2020.  Lung Cancer Screening: Does not qualify   PSA: Due      Review of Systems  Constitutional:  Positive for chills, fever and malaise/fatigue.  Respiratory:  Negative for shortness of breath.   Cardiovascular:  Negative for chest pain.  Gastrointestinal:  Negative for abdominal pain, constipation, diarrhea, nausea and vomiting.       BM daily  Genitourinary:  Negative for dysuria, frequency and hematuria.  Neurological:  Positive for headaches.  Psychiatric/Behavioral:  Negative for hallucinations and suicidal ideas.       Objective:     BP 118/76   Pulse 72   Temp 98.2 F (36.8 C) (Temporal)   Ht 5\' 11"  (1.803 m)   Wt 173 lb (78.5 kg)   SpO2 98%   BMI 24.13 kg/m     Physical Exam Vitals and nursing note reviewed.  Constitutional:      Appearance: Normal appearance.  HENT:     Right Ear: Tympanic membrane, ear canal and external ear normal.     Left Ear: Tympanic membrane, ear canal and external ear normal.     Mouth/Throat:     Mouth: Mucous membranes are moist.     Pharynx: Oropharynx is clear.  Eyes:     Extraocular Movements: Extraocular movements intact.     Pupils: Pupils are equal, round, and reactive to light.  Cardiovascular:     Rate and Rhythm: Normal rate and regular rhythm.     Pulses: Normal pulses.     Heart sounds: Normal heart sounds.  Pulmonary:     Effort: Pulmonary effort is normal.     Breath sounds: Normal breath sounds.  Abdominal:     General: Bowel sounds are normal. There is no distension.     Palpations: There is no mass.     Tenderness: There is no abdominal tenderness. There is no right CVA tenderness or left CVA tenderness.     Hernia: No hernia is present.  Musculoskeletal:     Right lower leg: No edema.     Left lower leg: No edema.  Lymphadenopathy:     Cervical: No cervical adenopathy.  Skin:    General: Skin is warm.  Neurological:     General: No focal deficit present.     Mental Status: He is alert.     Deep Tendon Reflexes:     Reflex Scores:      Bicep reflexes are 2+ on the right side and 2+ on the left side.      Patellar reflexes are 2+ on the right side and 2+ on the left side.    Comments: Bilateral upper and lower extremity strength 5/5  Psychiatric:        Mood and Affect: Mood normal.        Behavior: Behavior normal.        Thought Content: Thought content normal.        Judgment: Judgment normal.      Results for orders placed or performed in visit on 09/25/22  POCT urinalysis dipstick  Result Value Ref Range   Color, UA yellow    Clarity, UA cloudy    Glucose, UA Negative Negative   Bilirubin, UA neg    Ketones, UA neg    Spec Grav, UA 1.025 1.010 - 1.025   Blood, UA  pos    pH, UA 6.0 5.0 - 8.0   Protein, UA Positive (A) Negative   Urobilinogen, UA 0.2 0.2 or 1.0 E.U./dL   Nitrite, UA pos    Leukocytes, UA Moderate (2+) (A) Negative   Appearance     Odor        The ASCVD Risk score (Arnett DK, et al., 2019) failed to calculate for the following reasons:   The valid total cholesterol range is 130 to 320 mg/dL    Assessment & Plan:   Problem List Items Addressed This Visit       Cardiovascular and Mediastinum   Essential hypertension - Primary    Patient currently maintained on carvedilol.  Tolerates medication well.  No dizziness or lightheadedness.  Continue medication as prescribed      Relevant Orders   CBC   Comprehensive metabolic panel   TSH   CAD (coronary artery disease)    History of the same.  Patient has 7 steps followed by cardiology.  Is on clopidogrel and aspirin along with rosuvastatin.  Continue taking medication as prescribed follow-up with specialist as recommended      Relevant Orders   Lipid panel     Genitourinary   Cystitis with hematuria    UA indicative of urinary tract infection.  Will treat with Keflex 500 mg twice daily for a week.  Pending urine culture follow-up if no improvement      Relevant Medications   cephALEXin (KEFLEX) 500 MG capsule   Other Relevant Orders   Urine Culture     Other   Hospital discharge follow-up    Did review hospital note along with imaging and labs      Fever and chills    In the setting of recent hospital admission with several catheters and cystoscopy UA in office along with CBC      Relevant Orders   CBC   Comprehensive metabolic panel   POCT urinalysis dipstick (Completed)   Nocturia    UA in office along with PSA.  Pending result      Relevant Orders   PSA, Medicare   POCT urinalysis dipstick (Completed)   Other Visit Diagnoses     Screening for prostate cancer       Relevant Orders   PSA, Medicare  Return in about 1 year (around  09/25/2023) for CPE and Labs.    Audria Nine, NP

## 2022-09-25 NOTE — Assessment & Plan Note (Signed)
In the setting of recent hospital admission with several catheters and cystoscopy UA in office along with CBC

## 2022-09-26 ENCOUNTER — Ambulatory Visit (INDEPENDENT_AMBULATORY_CARE_PROVIDER_SITE_OTHER): Payer: Medicare Other

## 2022-09-26 VITALS — Ht 70.0 in | Wt 171.0 lb

## 2022-09-26 DIAGNOSIS — Z0000 Encounter for general adult medical examination without abnormal findings: Secondary | ICD-10-CM | POA: Diagnosis not present

## 2022-09-26 LAB — TSH: TSH: 0.65 u[IU]/mL (ref 0.35–5.50)

## 2022-09-26 LAB — PSA, MEDICARE: PSA: 11.88 ng/mL — ABNORMAL HIGH (ref 0.10–4.00)

## 2022-09-26 NOTE — Patient Instructions (Signed)
Mr. Tramell , Thank you for taking time to come for your Medicare Wellness Visit. I appreciate your ongoing commitment to your health goals. Please review the following plan we discussed and let me know if I can assist you in the future.   Referrals/Orders/Follow-Ups/Clinician Recommendations: Aim for 30 minutes of exercise or brisk walking, 6-8 glasses of water, and 5 servings of fruits and vegetables each day.   This is a list of the screening recommended for you and due dates:  Health Maintenance  Topic Date Due   Medicare Annual Wellness Visit  09/20/2022   COVID-19 Vaccine (4 - 2023-24 season) 01/25/2023*   Screening for Lung Cancer  12/08/2022   DTaP/Tdap/Td vaccine (2 - Td or Tdap) 09/22/2030   Colon Cancer Screening  10/07/2030   Pneumonia Vaccine  Completed   Hepatitis C Screening  Completed   Zoster (Shingles) Vaccine  Completed   HPV Vaccine  Aged Out   Flu Shot  Discontinued  *Topic was postponed. The date shown is not the original due date.    Advanced directives: (Copy Requested) Please bring a copy of your health care power of attorney and living will to the office to be added to your chart at your convenience.  Next Medicare Annual Wellness Visit scheduled for next year: Yes

## 2022-09-26 NOTE — Progress Notes (Signed)
Subjective:   Caleb House is a 72 y.o. male who presents for Medicare Annual/Subsequent preventive examination.  Visit Complete: Virtual  I connected with  Caleb House on 09/26/22 by a audio enabled telemedicine application and verified that I am speaking with the correct person using two identifiers.  Patient Location: Home  Provider Location: Home Office  I discussed the limitations of evaluation and management by telemedicine. The patient expressed understanding and agreed to proceed.  Patient Medicare AWV questionnaire was completed by the patient on 09/23/22; I have confirmed that all information answered by patient is correct and no changes since this date.  Vital Signs: Because this visit was a virtual/telehealth visit, some criteria may be missing or patient reported. Any vitals not documented were not able to be obtained and vitals that have been documented are patient reported.    Review of Systems      Cardiac Risk Factors include: advanced age (>42men, >8 women);male gender;hypertension;dyslipidemia     Objective:    Today's Vitals   09/26/22 0818  Weight: 171 lb (77.6 kg)  Height: 5\' 10"  (1.778 m)   Body mass index is 24.54 kg/m.     09/26/2022    8:27 AM 09/13/2022    8:17 AM 09/12/2022    5:00 PM 09/12/2022    4:39 PM 09/19/2021    9:58 AM 10/06/2020    7:21 AM 09/14/2020    8:29 AM  Advanced Directives  Does Patient Have a Medical Advance Directive? Yes   Yes No Yes Yes  Type of Estate agent of [place];Living will Healthcare Power of [place];Living will    Healthcare Power of [place];Living will Healthcare Power of [place];Living will  Does patient want to make changes to medical advance directive?  No - Patient declined No - Patient declined    No - Patient declined  Copy of Healthcare Power of Attorney in Chart? No - copy requested     No - copy requested No - copy requested  Would patient like information on creating a medical  advance directive?     No - Patient declined      Current Medications (verified) Outpatient Encounter Medications as of 09/26/2022  Medication Sig   acetaminophen (TYLENOL) 500 MG tablet Take 500 mg by mouth every 6 (six) hours as needed.   aspirin EC 81 MG tablet Take 81 mg by mouth daily. Swallow whole.   carvedilol (COREG) 6.25 MG tablet TAKE 1 TABLET BY MOUTH TWICE A DAY   cephALEXin (KEFLEX) 500 MG capsule Take 1 capsule (500 mg total) by mouth 2 (two) times daily.   clopidogrel (PLAVIX) 75 MG tablet TAKE 1 TABLET BY MOUTH EVERY DAY   Multiple Vitamins-Minerals (IMMUNE SUPPORT PO) Take 1 tablet by mouth in the morning and at bedtime.   rosuvastatin (CRESTOR) 40 MG tablet Take 1 tablet (40 mg total) by mouth daily.   tamsulosin (FLOMAX) 0.4 MG CAPS capsule Take 1 capsule (0.4 mg total) by mouth daily after supper.   No facility-administered encounter medications on file as of 09/26/2022.    Allergies (verified) Brilinta [ticagrelor], Entresto [sacubitril-valsartan], Isosorbide mononitrate [isosorbide nitrate], and Spironolactone   History: Past Medical History:  Diagnosis Date   CAD (coronary artery disease)    CHF (congestive heart failure) (HCC)    HTN (hypertension)    Past Surgical History:  Procedure Laterality Date   CARDIAC CATHETERIZATION     7 stents placed   cataracts  Bilateral 01/2022   COLONOSCOPY WITH PROPOFOL  N/A 10/06/2020   Procedure: COLONOSCOPY WITH PROPOFOL;  Surgeon: Wyline Mood, MD;  Location: [facility] ENDOSCOPY;  Service: Gastroenterology;  Laterality: N/A;   KNEE SURGERY     Right knee   TONSILLECTOMY     Family History  Problem Relation Age of Onset   Depression Mother    Alcohol abuse Mother    Suicidality Mother    Heart disease Father        No details   Heart disease Brother        No details   Suicidality Maternal Grandfather    Heart attack Paternal Grandfather 20   Social History   Socioeconomic History   Marital status: Divorced     Spouse name: Not on file   Number of children: 1   Years of education: college   Highest education level: Not on file  Occupational History   Not on file  Tobacco Use   Smoking status: Former    Current packs/day: 0.00    Average packs/day: 0.8 packs/day for 40.0 years (30.0 ttl pk-yrs)    Types: Cigarettes    Start date: 08/23/1978    Quit date: 08/23/2018    Years since quitting: 4.0   Smokeless tobacco: Never  Vaping Use   Vaping status: Never Used  Substance and Sexual Activity   Alcohol use: Not Currently    Comment: quit drinking 9 years ago   Drug use: Never   Sexual activity: Not Currently  Other Topics Concern   Not on file  Social History Narrative   09/02/19   From: [place], moved from [place] most recently   Living: alone   Work: retired - Emergency planning/management officer      Family: Daughter - Engineer, site - lives nearby, 2 grandchildren and 1 great-grandson      Enjoys: putter      Exercise: total gym - 1 hour 5 days a week - strength training    Diet: good overall       Safety   Seat belts: Yes    Guns: Yes  and secure   Safe in relationships: Yes    Social Determinants of Corporate investment banker Strain: Low Risk  (09/23/2022)   Overall Financial Resource Strain (CARDIA)    Difficulty of Paying Living Expenses: Not hard at all  Food Insecurity: No Food Insecurity (09/26/2022)   Hunger Vital Sign    Worried About Running Out of Food in the Last Year: Never true    Ran Out of Food in the Last Year: Never true  Transportation Needs: No Transportation Needs (09/23/2022)   PRAPARE - Administrator, Civil Service (Medical): No    Lack of Transportation (Non-Medical): No  Physical Activity: Sufficiently Active (09/23/2022)   Exercise Vital Sign    Days of Exercise per Week: 6 days    Minutes of Exercise per Session: 30 min  Stress: No Stress Concern Present (09/23/2022)   [employer] of Occupational Health - Occupational Stress Questionnaire    Feeling of Stress  : Not at all  Social Connections: Socially Isolated (09/23/2022)   Social Connection and Isolation Panel [NHANES]    Frequency of Communication with Friends and Family: More than three times a week    Frequency of Social Gatherings with Friends and Family: More than three times a week    Attends Religious Services: Never    Database administrator or Organizations: No    Attends Banker Meetings: Never  Marital Status: Divorced    Tobacco Counseling Counseling given: Not Answered   Clinical Intake:  Pre-visit preparation completed: Yes  Pain : No/denies pain     Nutritional Risks: None Diabetes: No  How often do you need to have someone help you when you read instructions, pamphlets, or other written materials from your doctor or pharmacy?: 1 - Never  Interpreter Needed?: No  Information entered by :: C.Kerly Rigsbee LPN   Activities of Daily Living    09/23/2022    9:18 AM 09/12/2022    4:39 PM  In your present state of health, do you have any difficulty performing the following activities:  Hearing? 0 0  Vision? 0 0  Difficulty concentrating or making decisions? 0 0  Walking or climbing stairs? 0 0  Dressing or bathing? 0 0  Doing errands, shopping? 0 0  Preparing Food and eating ? N   Using the Toilet? N   In the past six months, have you accidently leaked urine? N   Do you have problems with loss of bowel control? N   Managing your Medications? N   Managing your Finances? N   Housekeeping or managing your Housekeeping? N     Patient Care Team: Eden Emms, NP as PCP - General (Nurse Practitioner) Rollene Rotunda, MD as PCP - Cardiology (Cardiology)  Indicate any recent Medical Services you may have received from other than [facility] providers in the past year (date may be approximate).     Assessment:   This is a routine wellness examination for Kee.  Hearing/Vision screen Hearing Screening - Comments:: Denies hearing difficulties   Vision  Screening - Comments:: Readers - had cataracts removed nine months ago - [facility]- UTD on exams  Dietary issues and exercise activities discussed:     Goals Addressed             This Visit's Progress    Patient Stated       Increase exercise       Depression Screen    09/26/2022    8:20 AM 09/25/2022    9:26 AM 09/19/2021    9:57 AM 09/14/2020    9:00 AM 09/14/2020    8:30 AM  PHQ 2/9 Scores  PHQ - 2 Score 0 0 0 0 0  PHQ- 9 Score 0 3 0      Fall Risk    09/25/2022    9:26 AM 09/23/2022    9:18 AM 09/19/2021   10:00 AM 09/13/2021    4:01 PM 09/14/2020    8:17 AM  Fall Risk   Falls in the past year? 0 0 0 0 0  Number falls in past yr: 0 0 0  0  Injury with Fall? 0 0 0    Risk for fall due to : No Fall Risks No Fall Risks No Fall Risks    Follow up Falls evaluation completed Falls prevention discussed;Falls evaluation completed Falls prevention discussed      MEDICARE RISK AT HOME: Medicare Risk at Home Any stairs in or around the home?: No If so, are there any without handrails?: No Home free of loose throw rugs in walkways, pet beds, electrical cords, etc?: Yes Adequate lighting in your home to reduce risk of falls?: Yes Life alert?: No Use of a cane, walker or w/c?: No Grab bars in the bathroom?: No Shower chair or bench in shower?: Yes  TIMED UP AND GO:  Was the test performed?  No  Cognitive Function:        09/26/2022    8:28 AM 09/19/2021   10:08 AM  6CIT Screen  What Year? 0 points 0 points  What month? 0 points 0 points  What time? 0 points 0 points  Count back from 20 0 points 0 points  Months in reverse 0 points 0 points  Repeat phrase 0 points 0 points  Total Score 0 points 0 points    Immunizations Immunization History  Administered Date(s) Administered   PFIZER(Purple Top)SARS-COV-2 Vaccination 02/19/2019, 03/13/2019, 11/05/2019   PNEUMOCOCCAL CONJUGATE-20 09/14/2020   Tdap 09/21/2020   Zoster Recombinant(Shingrix)  09/21/2020, 11/28/2020    TDAP status: Up to date  Flu Vaccine status: Declined, Education has been provided regarding the importance of this vaccine but patient still declined. Advised may receive this vaccine at local pharmacy or Health Dept. Aware to provide a copy of the vaccination record if obtained from local pharmacy or Health Dept. Verbalized acceptance and understanding.  Pneumococcal vaccine status: Up to date  Covid-19 vaccine status: Information provided on how to obtain vaccines.   Qualifies for Shingles Vaccine? Yes   Zostavax completed      Shingrix Completed?: Yes  Screening Tests Health Maintenance  Topic Date Due   COVID-19 Vaccine (4 - 2023-24 season) 01/25/2023 (Originally 09/23/2022)   Lung Cancer Screening  12/08/2022   Medicare Annual Wellness (AWV)  09/26/2023   DTaP/Tdap/Td (2 - Td or Tdap) 09/22/2030   Colonoscopy  10/07/2030   Pneumonia Vaccine 60+ Years old  Completed   Hepatitis C Screening  Completed   Zoster Vaccines- Shingrix  Completed   HPV VACCINES  Aged Out   INFLUENZA VACCINE  Discontinued    Health Maintenance  There are no preventive care reminders to display for this patient.   Colorectal cancer screening: No longer required.   Lung Cancer Screening: (Low Dose CT Chest recommended if Age 30-80 years, 20 pack-year currently smoking OR have quit w/in 15years.) does qualify.   Lung Cancer Screening Referral: 12/11/21  Additional Screening:  Hepatitis C Screening: does qualify; Completed 10/22/05  Vision Screening: Recommended annual ophthalmology exams for early detection of glaucoma and other disorders of the eye. Is the patient up to date with their annual eye exam?  Yes  Who is the provider or what is the name of the office in which the patient attends annual eye exams? [facility] If pt is not established with a provider, would they like to be referred to a provider to establish care? Yes .   Dental Screening: Recommended  annual dental exams for proper oral hygiene  Diabetic Foot Exam:   Community Resource Referral / Chronic Care Management: CRR required this visit?  No   CCM required this visit?  No     Plan:     I have personally reviewed and noted the following in the patient's chart:   Medical and social history Use of alcohol, tobacco or illicit drugs  Current medications and supplements including opioid prescriptions. Patient is not currently taking opioid prescriptions. Functional ability and status Nutritional status Physical activity Advanced directives List of other physicians Hospitalizations, surgeries, and ER visits in previous 12 months Vitals Screenings to include cognitive, depression, and falls Referrals and appointments  In addition, I have reviewed and discussed with patient certain preventive protocols, quality metrics, and best practice recommendations. A written personalized care plan for preventive services as well as general preventive health recommendations were provided to patient.     Daimien Patmon  Linus Orn, LPN   06/24/9526   After Visit Summary: (MyChart) Due to this being a telephonic visit, the after visit summary with patients personalized plan was offered to patient via MyChart   Nurse Notes:  None

## 2022-09-27 ENCOUNTER — Encounter: Payer: Self-pay | Admitting: Nurse Practitioner

## 2022-09-27 ENCOUNTER — Encounter: Payer: Self-pay | Admitting: Urology

## 2022-09-27 DIAGNOSIS — R972 Elevated prostate specific antigen [PSA]: Secondary | ICD-10-CM

## 2022-09-27 DIAGNOSIS — N401 Enlarged prostate with lower urinary tract symptoms: Secondary | ICD-10-CM

## 2022-09-27 NOTE — Telephone Encounter (Signed)
done

## 2022-09-28 LAB — URINE CULTURE
MICRO NUMBER:: 15413303
SPECIMEN QUALITY:: ADEQUATE

## 2022-10-05 ENCOUNTER — Ambulatory Visit (HOSPITAL_COMMUNITY)
Admission: RE | Admit: 2022-10-05 | Discharge: 2022-10-05 | Disposition: A | Discharge status: Home / Self Care | Payer: Medicare Other | Source: Ambulatory Visit | Attending: Cardiology | Admitting: Cardiology

## 2022-10-05 DIAGNOSIS — I6523 Occlusion and stenosis of bilateral carotid arteries: Secondary | ICD-10-CM | POA: Insufficient documentation

## 2022-10-05 DIAGNOSIS — I6529 Occlusion and stenosis of unspecified carotid artery: Secondary | ICD-10-CM | POA: Insufficient documentation

## 2022-11-02 ENCOUNTER — Other Ambulatory Visit: Payer: Self-pay | Admitting: Cardiology

## 2022-11-09 ENCOUNTER — Telehealth: Payer: Self-pay | Admitting: Cardiology

## 2022-11-09 NOTE — Telephone Encounter (Signed)
Pt stated he is not currently SOB but had been experiencing it over the last . Pt stated he received a letter to make next appt with Dr. Antoine Poche so he went to send a message to the staff through [place] and saw a box asking "how are you doing". He filled out the box expressing sx of cough and phlegm. No CP, headache, dizziness or nausea reported at this time. Recommended pt contact PCP to get flu/strep test and made him an appointment with Dr. Antoine Poche in [place].  He had no further questions at this time.

## 2022-11-09 NOTE — Telephone Encounter (Signed)
Pt c/o Shortness Of Breath: STAT if SOB developed within the last 24 hours or pt is noticeably SOB on the phone  1. Are you currently SOB (can you hear that pt is SOB on the phone)? No I am not currently out of breath.  2. How long have you been experiencing SOB?  It has been more noticeable in the last 2 months or so.  3. Are you SOB when sitting or when up moving around? Short of breath when moving around at a quick pace, or doing physical activity like carrying heavy items, not when sitting or walking at a normal pace.  4. Are you currently experiencing any other symptoms? Only other symptom is I seem to be coughing up phlegm occasionally thru the course of the day, maybe 4-5 times in a day. It can be clear or a light yellowish in color.   Answers received via patient schedules.

## 2022-11-27 ENCOUNTER — Ambulatory Visit: Payer: Medicare Other | Admitting: Urology

## 2022-11-27 VITALS — BP 156/77 | HR 177 | Ht 70.0 in | Wt 171.0 lb

## 2022-11-27 DIAGNOSIS — R310 Gross hematuria: Secondary | ICD-10-CM | POA: Diagnosis not present

## 2022-11-27 DIAGNOSIS — R351 Nocturia: Secondary | ICD-10-CM

## 2022-11-27 DIAGNOSIS — N401 Enlarged prostate with lower urinary tract symptoms: Secondary | ICD-10-CM

## 2022-11-27 DIAGNOSIS — R972 Elevated prostate specific antigen [PSA]: Secondary | ICD-10-CM

## 2022-11-27 LAB — MICROSCOPIC EXAMINATION

## 2022-11-27 LAB — URINALYSIS, COMPLETE
Bilirubin, UA: NEGATIVE
Ketones, UA: NEGATIVE
Leukocytes,UA: NEGATIVE
Nitrite, UA: NEGATIVE
Specific Gravity, UA: 1.025 (ref 1.005–1.030)
Urobilinogen, Ur: 0.2 mg/dL (ref 0.2–1.0)
pH, UA: 5.5 (ref 5.0–7.5)

## 2022-11-27 NOTE — Progress Notes (Signed)
   11/27/22  CC:  Chief Complaint  Patient presents with   Cysto    HPI: 72 year old male who presents today for repeat cystoscopy.  Notably, he was managed as an inpatient for gross hematuria/clot retention by Dr. Mena Goes.  He returned for outpatient cystoscopy on 09/19/2021 which did show several areas of erythema on the posterior bladder wall which are irregular but felt to be likely traumatic.  He presents today for reevaluation of these areas.  In the setting of stage IV CKD, he had a CT stone protocol with no evidence of upper tract pathology, unable to receive IV contrast for delayed imaging.  PSA is also redrawn today and is pending.  PSA drawn in close proximity to his hospitalization/cystoscopy was 11.88.  There were no vitals taken for this visit. NED. A&Ox3.   No respiratory distress   Abd soft, NT, ND Normal external genitalia with patent urethral meatus  Cystoscopy Procedure Note  atient identification was confirmed, informed consent was obtained, and patient was prepped using Betadine solution.  Lidocaine jelly was administered per urethral meatus.       Pre-Procedure: - Inspection reveals a normal caliber ureteral meatus.   Procedure: The flexible cystoscope was introduced without difficulty - No urethral strictures/lesions are present. - Enlarged prostate  - Normal bladder neck - Bilateral ureteral orifices identified - Bladder mucosa  reveals no ulcers, tumors.   - No bladder stones - Mild trabeculation with single saccule on left lateral bladder wall   Retroflexion shows unremarkable     Post-Procedure: - Patient tolerated the procedure well  Assessment/ Plan:  1. Gross hematuria Etiology still remains unclear  Notes no recurrence today, no microscopic blood on urinalysis which is reassuring  Cystoscopy today is also unremarkable  Consider additional upper tract imaging if he has recurrence including retrograde pyelogram.  Will check a UA next  year. - Urinalysis, Complete  2. Elevated PSA PSA today is pending, will will call with results and follow-up plan based on these - PSA - PSA; Future  3. Benign prostatic hyperplasia with nocturia Notes improvement in his urinary symptoms since - PSA - PSA; Future  F/u 1 year UA/ PSA/ PVR/ IPSS (call with PSA pending)  Vanna Scotland, MD

## 2022-11-28 ENCOUNTER — Encounter: Payer: Self-pay | Admitting: Urology

## 2022-11-28 LAB — PSA: Prostate Specific Ag, Serum: 2.6 ng/mL (ref 0.0–4.0)

## 2022-12-10 ENCOUNTER — Ambulatory Visit
Admission: RE | Admit: 2022-12-10 | Discharge: 2022-12-10 | Disposition: A | Discharge status: Home / Self Care | Payer: Medicare Other | Source: Ambulatory Visit | Attending: Acute Care | Admitting: Acute Care

## 2022-12-10 ENCOUNTER — Encounter: Payer: Self-pay | Admitting: Urology

## 2022-12-10 DIAGNOSIS — Z122 Encounter for screening for malignant neoplasm of respiratory organs: Secondary | ICD-10-CM | POA: Insufficient documentation

## 2022-12-10 DIAGNOSIS — Z87891 Personal history of nicotine dependence: Secondary | ICD-10-CM | POA: Diagnosis present

## 2022-12-10 MED ORDER — TAMSULOSIN HCL 0.4 MG PO CAPS: 0.4000 mg | ORAL_CAPSULE | Freq: Every day | ORAL | 11 refills | Status: DC

## 2022-12-25 ENCOUNTER — Ambulatory Visit: Payer: Medicare Other | Admitting: Cardiology

## 2023-01-02 ENCOUNTER — Other Ambulatory Visit: Payer: Self-pay | Admitting: Acute Care

## 2023-01-02 DIAGNOSIS — Z87891 Personal history of nicotine dependence: Secondary | ICD-10-CM

## 2023-01-02 DIAGNOSIS — Z122 Encounter for screening for malignant neoplasm of respiratory organs: Secondary | ICD-10-CM

## 2023-02-10 DIAGNOSIS — N184 Chronic kidney disease, stage 4 (severe): Secondary | ICD-10-CM | POA: Insufficient documentation

## 2023-02-10 NOTE — Progress Notes (Unsigned)
Cardiology Office Note:   Date:  02/12/2023  ID:  Nnaemeka Drum, DOB May 09, 1950, MRN [id number] PCP: Eden Emms, NP  [facility] Providers Cardiologist:  Rollene Rotunda, MD {  History of Present Illness:   Caleb House is a 73 y.o. male who is referred by Gweneth Dimitri, MD for evaluation of CAD.  He came for [facility].   He has had severe coronary artery disease.  He had LAD and RCA chronic total obstruction and high-grade disease in his circumflex treated with PCI on September 09, 2018. He was turned down for bypass because of his severe COPD.  His ejection fraction in July 2020 was 38%.  He did have PET scanning that demonstrated moderate sized full-thickness defect in the mid to distal anterior wall and apex.  He had viable anterior and lateral wall.  He had LAD CTO revascularization.  He had ADR/Sting-Ray on October 23, 2018.   He had staged RCA CTO revascularization on January 01, 2019.  Of note he could not tolerate Brilinta secondary to shortness of breath.  His ejection fraction was 38%.   At the last visit I tried to start spironolactone but he had hyperkalemia.  His EF was 40 - 45% on echo in May of 2022.     I did try to titrate his Entresto but he did not tolerate this so he had a back down.  He was having lightheadedness.   Since I last saw him he has done well has done well.  The patient denies any new symptoms such as chest discomfort, neck or arm discomfort. There has been no new shortness of breath, PND or orthopnea. There have been no reported palpitations, presyncope or syncope.     ROS: As stated in the HPI and negative for all other systems.  Studies Reviewed:    EKG:   EKG Interpretation Date/Time:  Tuesday February 12 2023 14:42:44 EST Ventricular Rate:  58 PR Interval:  164 QRS Duration:  146 QT Interval:  434 QTC Calculation: 426 R Axis:   -55  Text Interpretation: Sinus bradycardia Left axis deviation Right bundle branch block Possible Inferior infarct  (cited on or before 20-Jul-2022) Anteroseptal infarct (cited on or before 20-Jul-2022) T wave abnormality, consider lateral ischemia No significant change since last tracing Confirmed by Rollene Rotunda ([id number]) on 02/12/2023 2:45:24 PM     Risk Assessment/Calculations:       Physical Exam:   VS:  BP (!) 150/76 (BP Location: Right Arm, Patient Position: Sitting, Cuff Size: Normal)   Pulse (!) 56   Ht 5\' 10"  (1.778 m)   Wt 179 lb 3.2 oz (81.3 kg)   SpO2 98%   BMI 25.71 kg/m    Wt Readings from Last 3 Encounters:  02/12/23 179 lb 3.2 oz (81.3 kg)  11/27/22 171 lb (77.6 kg)  09/26/22 171 lb (77.6 kg)     GEN: Well nourished, well developed in no acute distress NECK: No JVD; No carotid bruits CARDIAC: RRR, no murmurs, rubs, gallops RESPIRATORY:  Clear to auscultation without rales, wheezing or rhonchi  ABDOMEN: Soft, non-tender, non-distended EXTREMITIES:  No edema; No deformity   ASSESSMENT AND PLAN:   CAD:   The patient has no new sypmtoms.  No further cardiovascular testing is indicated.  We will continue with meds as listed.   SOB:    The patient denies SOB.  No change in therapy.    DYSLIPIDEMIA:   LDL was was 38.  No change in therapy.  CARDIOMYOPATHY:    Ejection fraction was 40 - 45% last year.  No change in therapy.   He does not tolerate med titration with CKD, and previous hyperkalemia on spiro and Entresto.  He had severe light headedness on Bidil.     CAROTID STENOSIS:   This was  40 to 59% stenosis on the left in September 2024.   He will have repeat testing later this year.    CKD:   Creat was down to 1.58 from  1.94.  No change in therapy.   Follow up with me in six months.    Signed, Rollene Rotunda, MD

## 2023-02-12 ENCOUNTER — Ambulatory Visit: Payer: Medicare Other | Attending: Cardiology | Admitting: Cardiology

## 2023-02-12 ENCOUNTER — Encounter: Payer: Self-pay | Admitting: Cardiology

## 2023-02-12 VITALS — BP 150/76 | HR 56 | Ht 70.0 in | Wt 179.2 lb

## 2023-02-12 DIAGNOSIS — I25118 Atherosclerotic heart disease of native coronary artery with other forms of angina pectoris: Secondary | ICD-10-CM | POA: Insufficient documentation

## 2023-02-12 DIAGNOSIS — I6523 Occlusion and stenosis of bilateral carotid arteries: Secondary | ICD-10-CM | POA: Insufficient documentation

## 2023-02-12 DIAGNOSIS — I429 Cardiomyopathy, unspecified: Secondary | ICD-10-CM | POA: Insufficient documentation

## 2023-02-12 DIAGNOSIS — E785 Hyperlipidemia, unspecified: Secondary | ICD-10-CM | POA: Insufficient documentation

## 2023-02-12 DIAGNOSIS — R0602 Shortness of breath: Secondary | ICD-10-CM | POA: Insufficient documentation

## 2023-02-12 NOTE — Patient Instructions (Signed)
Medication Instructions:  No changes *If you need a refill on your cardiac medications before your next appointment, please call your pharmacy*  Follow-Up: At [facility], you and your health needs are our priority.  As part of our continuing mission to provide you with exceptional heart care, we have created designated Provider Care Teams.  These Care Teams include your primary Cardiologist (physician) and Advanced Practice Providers (APPs -  Physician Assistants and Nurse Practitioners) who all work together to provide you with the care you need, when you need it.  We recommend signing up for the patient portal called "MyChart".  Sign up information is provided on this After Visit Summary.  MyChart is used to connect with patients for Virtual Visits (Telemedicine).  Patients are able to view lab/test results, encounter notes, upcoming appointments, etc.  Non-urgent messages can be sent to your provider as well.   To learn more about what you can do with MyChart, go to [email or web address].    Your next appointment:   6 month(s)  Provider:   Minus Breeding, MD

## 2023-04-12 ENCOUNTER — Ambulatory Visit: Admitting: Nurse Practitioner

## 2023-04-12 VITALS — BP 142/78 | HR 55 | Temp 97.6°F | Ht 70.0 in | Wt 178.2 lb

## 2023-04-12 DIAGNOSIS — K219 Gastro-esophageal reflux disease without esophagitis: Secondary | ICD-10-CM | POA: Diagnosis not present

## 2023-04-12 MED ORDER — PANTOPRAZOLE SODIUM 20 MG PO TBEC
20.0000 mg | DELAYED_RELEASE_TABLET | Freq: Every day | ORAL | 2 refills | Status: AC
Start: 2023-04-12 — End: ?

## 2023-04-12 NOTE — Progress Notes (Signed)
   Acute Office Visit  Subjective:     Patient ID: Caleb House, male    DOB: Sep 22, 1950, 73 y.o.   MRN: [id number]  Chief Complaint  Patient presents with   Gastroesophageal Reflux    Pt complains of constant indigestion that starts 3 hours after he eats. Complains of constant nausea. When belching pt states that his stomach seems to settle. No OTC meds.      Patient is in today for GERD with a history of ischemic cardiolyopahy, HEN. CAD. CHF. COPD, CKD  States that his symptoms have been intermittent until about 2 weeks ago. States that 3 hours after eating it would it. States that it would start it could dissipate. State that when he would lay down and feel like a half full stomach and will have to sit up States that he has ot tried any OTC  States that he can feel water sloshing in the belly States that he can belch and feel better. Increased in eructation with the symptoms    Does not use Nsaids Does not drink alcohol    Review of Systems  Constitutional:  Negative for chills and fever.  Respiratory:  Negative for shortness of breath.   Cardiovascular:  Negative for chest pain.  Gastrointestinal:  Positive for heartburn. Negative for abdominal pain, constipation, diarrhea, nausea and vomiting.  Neurological:  Negative for headaches.        Objective:    BP (!) 142/78   Pulse (!) 55   Temp 97.6 F (36.4 C) (Oral)   Ht 5\' 10"  (1.778 m)   Wt 178 lb 3.2 oz (80.8 kg)   SpO2 99%   BMI 25.57 kg/m    Physical Exam Vitals and nursing note reviewed.  Constitutional:      Appearance: Normal appearance.  Cardiovascular:     Rate and Rhythm: Normal rate and regular rhythm.     Heart sounds: Normal heart sounds.  Pulmonary:     Effort: Pulmonary effort is normal.     Breath sounds: Normal breath sounds.  Abdominal:     General: Bowel sounds are normal. There is no distension.     Palpations: There is no mass.     Tenderness: There is no abdominal tenderness.      Hernia: No hernia is present.  Neurological:     Mental Status: He is alert.     No results found for any visits on 04/12/23.      Assessment & Plan:   Problem List Items Addressed This Visit       Digestive   Gastroesophageal reflux disease - Primary   Increased eructation and bloated. No OTC use. Will do protonix since patient is on plavix. Will do a month up to three months. Print out on foods that increase reflux. Follow up if no improvement       Relevant Medications   pantoprazole (PROTONIX) 20 MG tablet    Meds ordered this encounter  Medications   pantoprazole (PROTONIX) 20 MG tablet    Sig: Take 1 tablet (20 mg total) by mouth daily.    Dispense:  30 tablet    Refill:  2    Supervising Provider:   Roxy Manns A [[id number]]    Return if symptoms worsen or fail to improve, for as scheduled .  Audria Nine, NP

## 2023-04-12 NOTE — Assessment & Plan Note (Signed)
 Increased eructation and bloated. No OTC use. Will do protonix since patient is on plavix. Will do a month up to three months. Print out on foods that increase reflux. Follow up if no improvement

## 2023-04-12 NOTE — Patient Instructions (Signed)
Nice to see you today I have sent the medication into the pharmacy Follow up if you do not improve

## 2023-04-23 ENCOUNTER — Ambulatory Visit: Admitting: Nurse Practitioner

## 2023-05-21 ENCOUNTER — Telehealth: Payer: Self-pay

## 2023-05-21 NOTE — Telephone Encounter (Signed)
 Copied from CRM [id number]. Topic: Appointments - Scheduling Inquiry for Clinic >> May 20, 2023 11:50 AM Caleb House C wrote: Reason for CRM: Patient [phone] states breathing is diminishing, cannot walking without having shortness of breath, wheezing, coughing phlegm sometimes clear or yellowish green for 6-7 months off and on. Patient denies fever, dizziness, shortness breath nor pain at this moment. Lov 11/07/2020 with NP, Caleb House and had  01/02/23 CT chest lung cancer screening ordered by NP, Caleb House. Unable to schedule an appointment, due to NP, Caleb House scheduling is unable and cannot get a hold of Caleb House. Please call patient back to schedule.  Spoke with patient regarding prior message . Patient stated last time he was seen by our office was 11/07/2020. Patient has been having SOB with exertion recently and been coughing up clear/yellow phlegm patient is not on any oxygen.Patient stated his O2 has been sitting in the mid to upper 90's . Patient would like a office visit with Sharlynn Dear can I please use one of you slots for patient to come in .

## 2023-05-22 NOTE — Telephone Encounter (Signed)
 Spoke with patient and made a f/u for SOB on 05/31/2023.Patient's voice was understanding.Nothing el;se further needed.

## 2023-05-27 ENCOUNTER — Other Ambulatory Visit: Payer: Self-pay | Admitting: Cardiology

## 2023-05-31 ENCOUNTER — Encounter: Payer: Self-pay | Admitting: Acute Care

## 2023-05-31 ENCOUNTER — Ambulatory Visit: Admitting: Acute Care

## 2023-05-31 VITALS — BP 159/74 | HR 60 | Ht 70.0 in | Wt 178.6 lb

## 2023-05-31 DIAGNOSIS — E785 Hyperlipidemia, unspecified: Secondary | ICD-10-CM

## 2023-05-31 DIAGNOSIS — I2510 Atherosclerotic heart disease of native coronary artery without angina pectoris: Secondary | ICD-10-CM

## 2023-05-31 DIAGNOSIS — J439 Emphysema, unspecified: Secondary | ICD-10-CM

## 2023-05-31 DIAGNOSIS — R0609 Other forms of dyspnea: Secondary | ICD-10-CM

## 2023-05-31 DIAGNOSIS — N1832 Chronic kidney disease, stage 3b: Secondary | ICD-10-CM | POA: Diagnosis not present

## 2023-05-31 DIAGNOSIS — Z87891 Personal history of nicotine dependence: Secondary | ICD-10-CM

## 2023-05-31 DIAGNOSIS — I252 Old myocardial infarction: Secondary | ICD-10-CM

## 2023-05-31 MED ORDER — BREZTRI AEROSPHERE 160-9-4.8 MCG/ACT IN AERO: 2.0000 | INHALATION_SPRAY | Freq: Two times a day (BID) | RESPIRATORY_TRACT | Status: DC

## 2023-05-31 MED ORDER — ALBUTEROL SULFATE HFA 108 (90 BASE) MCG/ACT IN AERS
1.0000 | INHALATION_SPRAY | Freq: Four times a day (QID) | RESPIRATORY_TRACT | 2 refills | Status: DC | PRN
Start: 2023-05-31 — End: 2023-08-05

## 2023-05-31 NOTE — Progress Notes (Addendum)
 History of Present Illness Javaree Laurence is a 73 y.o. male former  every day smoker self referred for worsening dyspnea. Followed through the screening program.He will be followed by Dr. Marygrace Snellen.  Pt. Has consented to use of [employer] soft wear to help capture the content of this OV .   05/31/2023 Pt. Presents for pulmonary consult for worsening dyspnea with exertion over the last several months. Mr. Dieken experiences worsening shortness of breath, particularly with exertion such as carrying weighted objects or performing activities like digging holes. These activities now leave him winded, which was not the case previously. Moving slowly helps alleviate the symptoms, while rapid movements exacerbate them. He has not attempted to climb stairs recently due to his symptoms. He monitors his oxygen saturation at home, which typically remains around 95-97%. He describes a sensation of not being able to take a full deep breath, feeling as if there is a block preventing full lung expansion.  He has a history of severe coronary artery disease with multiple heart attacks and has undergone angioplasty with the placement of seven stents between  2020 and 2022.He is followed by cardiology. He is currently on Plavix . Last echo done 07/2022 showed EF of 40-45%, and normal pulmonary Artery Systolic pressure, RV pressure estimate of 16.1 mmHg. He also has a history of hyperlipidemia and a family history of heart disease.  He quit smoking in 2020 and has a productive cough that varies in frequency and sputum color, ranging from clear to iridescent green. He attributes some of his respiratory symptoms to seasonal allergies, which he developed after moving to his current location., however is concerned about the increase in secretions lately.Previous pulmonary function testing in 2021 showed severe airway obstruction, but he has not had a recent test. He previously used Breo Ellipta for about a year but did not notice  significant improvement and discontinued it. He has never had a rescue inhaler.  He has a history of renal impairment, previously told he was in stage four, but later corrected to stage three. His creatinine was 1.5 eight months ago. He was taken off Entresto  due to elevated potassium levels by cardiology, and he noticed increased shortness of breath since discontinuing it.Most recent echo was 07/2022.It showed EF of 40-45%, and normal pulmonary Artery Systolic pressure, RV pressure estimate of 16.1 mmHg.   He lives alone, is retired, and has adult children. He has not traveled recently but plans a trip to [facility] soon.  Test Results: Walking oximetry 08/2018      Latest Ref Rng & Units 09/25/2022    9:57 AM 09/16/2022    8:31 AM 09/15/2022    7:57 PM  CBC  WBC 4.0 - 10.5 K/uL 12.2  5.0  6.0   Hemoglobin 13.0 - 17.0 g/dL 16.1  09.6  04.5   Hematocrit 39.0 - 52.0 % 35.0  34.1  32.7   Platelets 150.0 - 400.0 K/uL 317.0  202  184        Latest Ref Rng & Units 09/25/2022    9:57 AM 09/15/2022   11:46 AM 09/14/2022    7:26 AM  BMP  Glucose 70 - 99 mg/dL 409  811  914   BUN 6 - 23 mg/dL 24  29  28    Creatinine 0.40 - 1.50 mg/dL 7.82  9.56  2.13   Sodium 135 - 145 mEq/L 136  134  133   Potassium 3.5 - 5.1 mEq/L 4.7  4.0  3.9   Chloride 96 -  112 mEq/L 104  104  103   CO2 19 - 32 mEq/L 24  22  22    Calcium  8.4 - 10.5 mg/dL 8.8  8.7  8.6     BNP    Component Value Date/Time   BNP 60.4 07/23/2022 1216    ProBNP No results found for: "PROBNP"  PFT No results found for: "FEV1PRE", "FEV1POST", "FVCPRE", "FVCPOST", "TLC", "DLCOUNC", "PREFEV1FVCRT", "PSTFEV1FVCRT"  No results found.   Past medical hx Past Medical History:  Diagnosis Date   CAD (coronary artery disease)    CHF (congestive heart failure) (HCC)    HTN (hypertension)      Social History   Tobacco Use   Smoking status: Former    Current packs/day: 0.00    Average packs/day: 0.8 packs/day for 40.0 years (30.0 ttl  pk-yrs)    Types: Cigarettes    Start date: 08/23/1978    Quit date: 08/23/2018    Years since quitting: 4.7   Smokeless tobacco: Never  Vaping Use   Vaping status: Never Used  Substance Use Topics   Alcohol use: Not Currently    Comment: quit drinking 9 years ago   Drug use: Never    Mr.Tait reports that he quit smoking about 4 years ago. His smoking use included cigarettes. He started smoking about 44 years ago. He has a 30 pack-year smoking history. He has never used smokeless tobacco. He reports that he does not currently use alcohol. He reports that he does not use drugs.  Tobacco Cessation: Counseling given: Not Answered Former smoker , quit 08/23/2018, with a 30 pack year smoking history  Past surgical hx, Family hx, Social hx all reviewed.  Current Outpatient Medications on File Prior to Visit  Medication Sig   acetaminophen  (TYLENOL ) 500 MG tablet Take 500 mg by mouth every 6 (six) hours as needed.   aspirin EC 81 MG tablet Take 81 mg by mouth daily. Swallow whole.   carvedilol  (COREG ) 6.25 MG tablet TAKE 1 TABLET BY MOUTH TWICE A DAY   clopidogrel  (PLAVIX ) 75 MG tablet TAKE 1 TABLET BY MOUTH EVERY DAY   Multiple Vitamins-Minerals (IMMUNE SUPPORT PO) Take 1 tablet by mouth in the morning and at bedtime.   pantoprazole  (PROTONIX ) 20 MG tablet Take 1 tablet (20 mg total) by mouth daily. (Patient taking differently: Take 20 mg by mouth daily. Takes as needed)   rosuvastatin  (CRESTOR ) 40 MG tablet TAKE 1 TABLET BY MOUTH EVERY DAY   tamsulosin  (FLOMAX ) 0.4 MG CAPS capsule Take 1 capsule (0.4 mg total) by mouth daily after supper.   No current facility-administered medications on file prior to visit.     Allergies  Allergen Reactions   Brilinta [Ticagrelor] Other (See Comments)    Pt states difficulty breathing   Entresto  [Sacubitril -Valsartan ] Other (See Comments)    hyperkalemia   Isosorbide  Mononitrate [Isosorbide  Nitrate] Other (See Comments)    headache    Spironolactone      Hyperkalemia  Pt states he does not remember.    Review Of Systems:  Constitutional:   No  weight loss, night sweats,  Fevers, chills, fatigue, or  lassitude.  HEENT:   No headaches,  Difficulty swallowing,  Tooth/dental problems, or  Sore throat,                No sneezing, itching, ear ache, nasal congestion, post nasal drip,   CV:  No chest pain,  Orthopnea, PND, swelling in lower extremities, anasarca, dizziness, palpitations, syncope.   GI  No  heartburn, indigestion, abdominal pain, nausea, vomiting, diarrhea, change in bowel habits, loss of appetite, bloody stools.   Resp: + shortness of breath with exertion less at rest.  + excess mucus, + productive cough,  No non-productive cough,  No coughing up of blood.  + change in color of mucus.  No wheezing.  No chest wall deformity  Skin: no rash or lesions.  GU: no dysuria, change in color of urine, no urgency or frequency.  No flank pain, no hematuria   MS:  No joint pain or swelling.  No decreased range of motion.  No back pain.  Psych:  No change in mood or affect. No depression or anxiety.  No memory loss.   Vital Signs BP (!) 159/74 (BP Location: Left Arm, Patient Position: Sitting, Cuff Size: Normal)   Pulse 60   Ht 5\' 10"  (1.778 m)   Wt 178 lb 9.6 oz (81 kg)   SpO2 97%   BMI 25.63 kg/m    Physical Exam:  General- No distress,  A&Ox3, pleasant ENT: No sinus tenderness, TM clear, pale nasal mucosa, no oral exudate,no post nasal drip, no LAN Cardiac: S1, S2, regular rate and rhythm, no murmur Chest: No wheeze/ rales/ dullness; no accessory muscle use, no nasal flaring, no sternal retractions, diminished per bases Abd.: Soft Non-tender, nondistended, bowel sounds positive,Body mass index is 25.63 kg/m.  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 You will get a call to get this scheduled.  I have also ordered a heart echo. You will get a call to get this scheduled  I will give you some samples today of Breztri. Take 2 puffs in the morning and 2 puffs in the evening. Rinse mouth after use. I will send in an albuterol inhaler  1-2 puffs as needed for shortness of breath or wheezing. Follow up with Dr. Marygrace Snellen to establish, or Isa Manuel NP within afew weeks of echo and PFT completion  You will get a call to schedule Next lung cancer screening due November 2025 Call if you need us  sooner Please contact office for sooner follow up if symptoms do not improve or worsen or seek emergency care    Myocardial infarction and stent placement Multiple myocardial infarctions and seven stents in 2020.  On Plavix . Mildly decreased LV function noted. Cardiac contribution to dyspnea considered. - Coordinate with cardiologist for ongoing management and follow-up. - Echo to re-evaluate PAP and EF  Hypertension Hypertension with intolerance to certain antihypertensives. Discontinuation of Entresto  may have increased dyspnea. - Per  primary care and cardiology for hypertension management.  Hyperlipidemia Hyperlipidemia management plan not detailed. - Per with primary care and cardiology for hyperlipidemia management.  Chronic kidney disease, stage 3 Stage 3 CKD with creatinine  1.5 eight months ago.  No dialysis.  Breztri does not require dosage adjustment In renal disease  unless severe. - Monitor renal function  with periodic creatinine checks.  I spent 35 minutes dedicated to the care of this patient on the date of this encounter to include pre-visit review of records, face-to-face time with the patient discussing conditions above, post visit ordering of testing, clinical documentation with the electronic health record, making appropriate referrals as documented, and communicating necessary information to the patient's healthcare team.    Raejean Bullock, NP 05/31/2023  8:37 AM

## 2023-05-31 NOTE — Patient Instructions (Addendum)
 It is good to see you today. I have ordered Pulmonary Function Testing as it has been 4 years since you have had this done. You will get a call to get this scheduled.  I have also ordered a heart echo. You will get a call to get this scheduled  I will give you some samples today of Breztri. Take 2 puffs in the morning and 2 puffs in the evening. Rinse mouth after use. I will send in an albuterol inhaler  1-2 puffs as needed for shortness of breath or wheezing. Follow up with Dr. Marygrace Snellen to establish, or Isa Manuel NP within afew weeks of echo and PFT completion  You will get a call to schedule Next lung cancer screening due November 2025 Call if you need us  sooner Please contact office for sooner follow up if symptoms do not improve or worsen or seek emergency care

## 2023-06-03 ENCOUNTER — Other Ambulatory Visit: Payer: Self-pay

## 2023-06-03 MED ORDER — BREZTRI AEROSPHERE 160-9-4.8 MCG/ACT IN AERO: 2.0000 | INHALATION_SPRAY | Freq: Two times a day (BID) | RESPIRATORY_TRACT | 5 refills | Status: DC

## 2023-06-04 ENCOUNTER — Other Ambulatory Visit: Payer: Self-pay | Admitting: *Deleted

## 2023-06-05 ENCOUNTER — Other Ambulatory Visit: Payer: Self-pay | Admitting: Acute Care

## 2023-06-05 ENCOUNTER — Other Ambulatory Visit: Payer: Self-pay

## 2023-06-05 MED ORDER — BREZTRI AEROSPHERE 160-9-4.8 MCG/ACT IN AERO
2.0000 | INHALATION_SPRAY | Freq: Two times a day (BID) | RESPIRATORY_TRACT | 5 refills | Status: DC
Start: 2023-06-05 — End: 2023-08-12
  Filled 2023-06-05 – 2023-06-06 (×4): qty 10.7, 30d supply, fill #0

## 2023-06-06 ENCOUNTER — Telehealth: Payer: Self-pay | Admitting: *Deleted

## 2023-06-06 ENCOUNTER — Other Ambulatory Visit: Payer: Self-pay

## 2023-06-06 NOTE — Telephone Encounter (Signed)
 Pam from the Pharmacy is calling. Caleb House is too expensive. It says "try Trellegy" when she tries to fill it. Please contact PT or Pam at the Pharmacy to advise substitute.   Pam is [phone]  PT is [phone]

## 2023-06-07 ENCOUNTER — Other Ambulatory Visit (HOSPITAL_COMMUNITY): Payer: Self-pay

## 2023-06-07 ENCOUNTER — Telehealth: Payer: Self-pay

## 2023-06-07 ENCOUNTER — Other Ambulatory Visit: Payer: Self-pay

## 2023-06-07 MED ORDER — BREZTRI AEROSPHERE 160-9-4.8 MCG/ACT IN AERO: 2.0000 | INHALATION_SPRAY | Freq: Two times a day (BID) | RESPIRATORY_TRACT | Status: DC

## 2023-06-07 NOTE — Telephone Encounter (Signed)
 Sent office a message in original request if Trelegy has been tried.

## 2023-06-07 NOTE — Telephone Encounter (Signed)
 Got letter stating a pa was needed on his breztri,its saying insurance wont cover

## 2023-06-07 NOTE — Telephone Encounter (Signed)
 Would trelegy be appropriate,or should I call the patient to come fill financial assistance,I just got a letter in your mail about a prior auth for him for the breztri

## 2023-06-07 NOTE — Telephone Encounter (Signed)
 Has the patient tried the preferred alternative of Trelegy?

## 2023-06-07 NOTE — Telephone Encounter (Signed)
*  Pulm  Pharmacy Patient Advocate Encounter   Received notification from CoverMyMeds that prior authorization for Breztri Aerosphere 160-9-4.8MCG/ACT aerosol  is required/requested.   Insurance verification completed.   The patient is insured through [facility] .   Per test claim:  Trelegy Ellipta is preferred by the insurance.  If suggested medication is appropriate, Please send in a new RX and discontinue this one. If not, please advise as to why it's not appropriate so that we may request a Prior Authorization. Please note, some preferred medications may still require a PA.  If the suggested medications have not been trialed and there are no contraindications to their use, the PA will not be submitted, as it will not be approved.   CMM Key: [id number]

## 2023-06-11 ENCOUNTER — Other Ambulatory Visit: Payer: Self-pay

## 2023-06-11 ENCOUNTER — Other Ambulatory Visit: Payer: Self-pay | Admitting: Acute Care

## 2023-06-11 DIAGNOSIS — J449 Chronic obstructive pulmonary disease, unspecified: Secondary | ICD-10-CM

## 2023-06-11 MED ORDER — TRELEGY ELLIPTA 100-62.5-25 MCG/ACT IN AEPB
1.0000 | INHALATION_SPRAY | Freq: Every day | RESPIRATORY_TRACT | 6 refills | Status: DC
Start: 2023-06-11 — End: 2023-11-05

## 2023-06-11 NOTE — Telephone Encounter (Signed)
 Request will likely be denied since patient has not tried the preferred alternative of Trelegy.

## 2023-06-11 NOTE — Telephone Encounter (Signed)
 Left pt message to let us  know if he has tried Trelegy before

## 2023-06-11 NOTE — Telephone Encounter (Signed)
 Pt.notified

## 2023-06-11 NOTE — Telephone Encounter (Signed)
 Pt states he has never tried Trelegy before only Breo Ellipta

## 2023-06-11 NOTE — Telephone Encounter (Signed)
 Per office-patient states they have only tried Breo Ellipta

## 2023-06-11 NOTE — Telephone Encounter (Signed)
**Note De-identified  [place] Obfuscation** Please advise 

## 2023-06-12 NOTE — Telephone Encounter (Signed)
 Med changed to Trelegy as this is the preferred alternative and patient has not tried

## 2023-06-25 ENCOUNTER — Ambulatory Visit (HOSPITAL_BASED_OUTPATIENT_CLINIC_OR_DEPARTMENT_OTHER)

## 2023-06-25 DIAGNOSIS — R0609 Other forms of dyspnea: Secondary | ICD-10-CM | POA: Diagnosis not present

## 2023-06-25 LAB — ECHOCARDIOGRAM COMPLETE
Area-P 1/2: 2.05 cm2
P 1/2 time: 619 ms
S' Lateral: 3.26 cm

## 2023-06-25 MED ORDER — PERFLUTREN LIPID MICROSPHERE
1.0000 mL | INTRAVENOUS | Status: AC | PRN
Start: 2023-06-25 — End: 2023-06-25
  Administered 2023-06-25: 1 mL via INTRAVENOUS

## 2023-06-26 ENCOUNTER — Encounter: Payer: Self-pay | Admitting: Cardiology

## 2023-08-02 ENCOUNTER — Ambulatory Visit (INDEPENDENT_AMBULATORY_CARE_PROVIDER_SITE_OTHER): Admitting: Internal Medicine

## 2023-08-02 ENCOUNTER — Encounter: Payer: Self-pay | Admitting: Acute Care

## 2023-08-02 ENCOUNTER — Ambulatory Visit (INDEPENDENT_AMBULATORY_CARE_PROVIDER_SITE_OTHER): Admitting: Acute Care

## 2023-08-02 VITALS — BP 134/78 | HR 48 | Ht 71.0 in | Wt 182.0 lb

## 2023-08-02 DIAGNOSIS — I25118 Atherosclerotic heart disease of native coronary artery with other forms of angina pectoris: Secondary | ICD-10-CM

## 2023-08-02 DIAGNOSIS — R0609 Other forms of dyspnea: Secondary | ICD-10-CM | POA: Diagnosis not present

## 2023-08-02 DIAGNOSIS — J449 Chronic obstructive pulmonary disease, unspecified: Secondary | ICD-10-CM

## 2023-08-02 DIAGNOSIS — Z87891 Personal history of nicotine dependence: Secondary | ICD-10-CM

## 2023-08-02 DIAGNOSIS — J439 Emphysema, unspecified: Secondary | ICD-10-CM | POA: Diagnosis not present

## 2023-08-02 LAB — PULMONARY FUNCTION TEST
DL/VA % pred: 83 %
DL/VA: 3.3 ml/min/mmHg/L
DLCO cor % pred: 76 %
DLCO cor: 20.01 ml/min/mmHg
DLCO unc % pred: 76 %
DLCO unc: 20.01 ml/min/mmHg
FEF 25-75 Post: 0.64 L/s
FEF 25-75 Pre: 0.54 L/s
FEF2575-%Change-Post: 19 %
FEF2575-%Pred-Post: 27 %
FEF2575-%Pred-Pre: 22 %
FEV1-%Change-Post: 7 %
FEV1-%Pred-Post: 44 %
FEV1-%Pred-Pre: 41 %
FEV1-Post: 1.44 L
FEV1-Pre: 1.34 L
FEV1FVC-%Change-Post: -3 %
FEV1FVC-%Pred-Pre: 68 %
FEV6-%Change-Post: 9 %
FEV6-%Pred-Post: 69 %
FEV6-%Pred-Pre: 63 %
FEV6-Post: 2.91 L
FEV6-Pre: 2.64 L
FEV6FVC-%Change-Post: -2 %
FEV6FVC-%Pred-Post: 103 %
FEV6FVC-%Pred-Pre: 105 %
FVC-%Change-Post: 11 %
FVC-%Pred-Post: 67 %
FVC-%Pred-Pre: 60 %
FVC-Post: 2.99 L
FVC-Pre: 2.69 L
Post FEV1/FVC ratio: 48 %
Post FEV6/FVC ratio: 97 %
Pre FEV1/FVC ratio: 50 %
Pre FEV6/FVC Ratio: 99 %
RV % pred: 175 %
RV: 4.54 L
TLC % pred: 116 %
TLC: 8.46 L

## 2023-08-02 MED ORDER — BREZTRI AEROSPHERE 160-9-4.8 MCG/ACT IN AERO: 2.0000 | INHALATION_SPRAY | Freq: Two times a day (BID) | RESPIRATORY_TRACT | Status: DC

## 2023-08-02 MED ORDER — BREZTRI AEROSPHERE 160-9-4.8 MCG/ACT IN AERO: 2.0000 | INHALATION_SPRAY | Freq: Two times a day (BID) | RESPIRATORY_TRACT | 3 refills | Status: DC

## 2023-08-02 NOTE — Progress Notes (Signed)
 Full PFT performed today.

## 2023-08-02 NOTE — Patient Instructions (Signed)
 Is good to see today. I am glad the inhalers have improved her lung function. We have reviewed your pulmonary function test today and they show severe obstructive disease, very similar to the last set of readings you had done in [place] . We have also reviewed your echo results which show improvement since your last echo. We were not able to get a pulmonary artery pressure reading. As you prefer Breztri , we will see if you qualify for financial assistance through the manufacturer. We will give you some samples of Breztri  today Take 2 puffs in the morning and 2 puffs in the evening. Rinse mouth after use Stop Trelegy while you are using the Breztri  samples. If you run out of the Breztri  samples resume Trelegy 1 puff once daily. Hopefully by then we will have heard from the manufacturer regarding possible financial assistance. Follow-up appointment in 3 months to assess how you are doing. If you get approved for reduced cost Breztri  it is okay for a 68-month follow-up If you are denied reduced cost Breztri  we will add a nebulizer treatment called Ohtuvayre  to the Trelegy that your insurance is covering. Note your daily symptoms > remember red flags for COPD:  Increase in cough, increase in sputum production, increase in shortness of breath or activity intolerance. If you notice these symptoms, please call to be seen.    Please contact office for sooner follow up if symptoms do not improve or worsen or seek emergency care

## 2023-08-02 NOTE — Progress Notes (Signed)
 History of Present Illness Caleb House is a 73 y.o. male former smoker with severe COPD with worsening dyspnea.He is followed by Dr. annella   08/02/2023 Pt. Presents for follow up after echo and PFT's to evaluate his worsening shortness of breath. He states since he has been on Breztri / Trelegy he can take deeper breaths, and is less short of breath.We ordered Breztri , however his insurance denied Breztri  coverage , and only approved Trelegy which he states does not work as well for him as well as Breztri .  His out-of-pocket cost for Breztri  was $700.  His out-of-pocket cost for Trelegy is greater than $300.  We will try and get him evaluated for financial assistance for Breztri .  If he does not qualify for Breztri  we will try for the Trelegy.  If patient has to maintain Trelegy as is maintenance we will consider adding Ohtuvayre  nebs  to see if he gets improvement.  We have reviewed the patient's pulmonary function test as well as his echo.  Pulmonary function test continues to show severe COPD, with moderately reduced DLCO.  Patient's echo is actually improved over the last echo  done by cardiology.  Unfortunately the PA pressure was not able to be determined on the echo.  He has follow-up with Dr. Lavona in August.  As patient has had some improvement on the Breztri  /Trelegy as maintenance, we will evaluate the possibility of him getting financial assistance for the Breztri .  I will send him with some additional samples of Breztri  today, and we will have him fill out the paperwork. I will have the patient follow-up in 3 months to ensure he is doing well on his maintenance inhaler, and to determine if we need to add maintenance nebulizer treatment.  I have asked him to return to the office sooner for any signs or symptoms of a flare. Note your daily symptoms > remember red flags for COPD:  Increase in cough, increase in sputum production, increase in shortness of breath or activity  intolerance. If you notice these symptoms, please call to be seen.     Test Results: PFT's done 08/02/2023                      Latest Ref Rng & Units 09/25/2022    9:57 AM 09/16/2022    8:31 AM 09/15/2022    7:57 PM  CBC  WBC 4.0 - 10.5 K/uL 12.2  5.0  6.0   Hemoglobin 13.0 - 17.0 g/dL 89.0  88.9  89.3   Hematocrit 39.0 - 52.0 % 35.0  34.1  32.7   Platelets 150.0 - 400.0 K/uL 317.0  202  184        Latest Ref Rng & Units 09/25/2022    9:57 AM 09/15/2022   11:46 AM 09/14/2022    7:26 AM  BMP  Glucose 70 - 99 mg/dL 870  893  879   BUN 6 - 23 mg/dL 24  29  28    Creatinine 0.40 - 1.50 mg/dL 8.41  7.80  7.80   Sodium 135 - 145 mEq/L 136  134  133   Potassium 3.5 - 5.1 mEq/L 4.7  4.0  3.9   Chloride 96 - 112 mEq/L 104  104  103   CO2 19 - 32 mEq/L 24  22  22    Calcium  8.4 - 10.5 mg/dL 8.8  8.7  8.6     BNP    Component Value Date/Time   BNP 60.4 07/23/2022  1216    ProBNP No results found for: PROBNP  PFT    Component Value Date/Time   FEV1PRE 1.34 08/02/2023 0840   FEV1POST 1.44 08/02/2023 0840   FVCPRE 2.69 08/02/2023 0840   FVCPOST 2.99 08/02/2023 0840   TLC 8.46 08/02/2023 0840   DLCOUNC 20.01 08/02/2023 0840   PREFEV1FVCRT 50 08/02/2023 0840   PSTFEV1FVCRT 48 08/02/2023 0840    No results found.   Past medical hx Past Medical History:  Diagnosis Date   CAD (coronary artery disease)    CHF (congestive heart failure) (HCC)    HTN (hypertension)      Social History   Tobacco Use   Smoking status: Former    Current packs/day: 0.00    Average packs/day: 0.8 packs/day for 40.0 years (30.0 ttl pk-yrs)    Types: Cigarettes    Start date: 08/23/1978    Quit date: 08/23/2018    Years since quitting: 4.9   Smokeless tobacco: Never  Vaping Use   Vaping status: Never Used  Substance Use Topics   Alcohol use: Not Currently    Comment: quit drinking 9 years ago   Drug use: Never    Mr.Terrio reports that he quit smoking about 4 years ago.  His smoking use included cigarettes. He started smoking about 44 years ago. He has a 30 pack-year smoking history. He has never used smokeless tobacco. He reports that he does not currently use alcohol. He reports that he does not use drugs.  Tobacco Cessation: Former smoker, quit 2021 with a 30 pack year smoking history   Past surgical hx, Family hx, Social hx all reviewed.  Current Outpatient Medications on File Prior to Visit  Medication Sig   acetaminophen  (TYLENOL ) 500 MG tablet Take 500 mg by mouth every 6 (six) hours as needed.   albuterol  (VENTOLIN  HFA) 108 (90 Base) MCG/ACT inhaler Inhale 1-2 puffs into the lungs every 6 (six) hours as needed for wheezing or shortness of breath.   aspirin EC 81 MG tablet Take 81 mg by mouth daily. Swallow whole.   carvedilol  (COREG ) 6.25 MG tablet TAKE 1 TABLET BY MOUTH TWICE A DAY   clopidogrel  (PLAVIX ) 75 MG tablet TAKE 1 TABLET BY MOUTH EVERY DAY   Fluticasone-Umeclidin-Vilant (TRELEGY ELLIPTA ) 100-62.5-25 MCG/ACT AEPB Inhale 1 puff into the lungs daily.   Multiple Vitamins-Minerals (IMMUNE SUPPORT PO) Take 1 tablet by mouth in the morning and at bedtime.   pantoprazole  (PROTONIX ) 20 MG tablet Take 1 tablet (20 mg total) by mouth daily. (Patient taking differently: Take 20 mg by mouth daily. Takes as needed)   rosuvastatin  (CRESTOR ) 40 MG tablet TAKE 1 TABLET BY MOUTH EVERY DAY   tamsulosin  (FLOMAX ) 0.4 MG CAPS capsule Take 1 capsule (0.4 mg total) by mouth daily after supper.   budesonide -glycopyrrolate -formoterol  (BREZTRI  AEROSPHERE) 160-9-4.8 MCG/ACT AERO inhaler Inhale 2 puffs into the lungs in the morning and at bedtime.   budesonide -glycopyrrolate -formoterol  (BREZTRI  AEROSPHERE) 160-9-4.8 MCG/ACT AERO inhaler Inhale 2 puffs into the lungs in the morning and at bedtime.   No current facility-administered medications on file prior to visit.     Allergies  Allergen Reactions   Brilinta [Ticagrelor] Other (See Comments)    Pt states  difficulty breathing   Entresto  [Sacubitril -Valsartan ] Other (See Comments)    hyperkalemia   Isosorbide  Mononitrate [Isosorbide  Nitrate] Other (See Comments)    headache   Spironolactone      Hyperkalemia  Pt states he does not remember.    Review Of Systems:  Constitutional:  No  weight loss, night sweats,  Fevers, chills, fatigue, or  lassitude.  HEENT:   No headaches,  Difficulty swallowing,  Tooth/dental problems, or  Sore throat,                No sneezing, itching, ear ache, nasal congestion, post nasal drip,   CV:  No chest pain,  Orthopnea, PND, swelling in lower extremities, anasarca, dizziness, palpitations, syncope.   GI  No heartburn, indigestion, abdominal pain, nausea, vomiting, diarrhea, change in bowel habits, loss of appetite, bloody stools.   Resp: + baseline  shortness of breath with exertion or at rest.  No excess mucus, + baseline  productive cough,  No non-productive cough,  No coughing up of blood.  No change in color of mucus.  + occasional  wheezing.  No chest wall deformity  Skin: no rash or lesions.  GU: no dysuria, change in color of urine, no urgency or frequency.  No flank pain, no hematuria   MS:  No joint pain or swelling.  No decreased range of motion.  No back pain.  Psych:  No change in mood or affect. No depression or anxiety.  No memory loss.   Vital Signs BP 134/78 (BP Location: Right Arm, Patient Position: Sitting, Cuff Size: Normal)   Pulse (!) 48   Ht 5' 11 (1.803 m)   Wt 182 lb (82.6 kg)   SpO2 99%   BMI 25.38 kg/m    Physical Exam:  General- No distress,  A&Ox3, pleasant ENT: No sinus tenderness, TM clear, pale nasal mucosa, no oral exudate,no post nasal drip, no LAN Cardiac: S1, S2, regular rate and rhythm, no murmur Chest: No wheeze/ rales/ dullness; no accessory muscle use, no nasal flaring, no sternal retractions, diminished per bases Abd.: Soft Non-tender, ND,  BS +, Body mass index is 25.38 kg/m.  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 We have reviewed your pulmonary function test today and they show severe obstructive disease, very similar to the last set of readings you had done in [place] . We have also reviewed your echo results which show improvement since your last echo. We were not able to get a pulmonary artery pressure reading. As you prefer Breztri , we will see if you qualify for financial assistance through the manufacturer. We will give you some samples of Breztri  today Take 2 puffs in the morning and 2 puffs in the evening. Rinse mouth after use Stop Trelegy while you are using the Breztri  samples. If you run out of the Breztri  samples resume Trelegy 1 puff once daily. Hopefully by then we will have heard from the manufacturer regarding possible financial assistance. Follow-up appointment in 3 months to assess how you are doing with Dr. Annella. If you get approved for reduced cost Breztri  it is okay for a 49-month follow-up If you are denied reduced cost Breztri  we will add a nebulizer treatment called Ohtuvayre  to the Trelegy that your insurance is covering. Note your daily symptoms > remember red flags for COPD:  Increase  in cough, increase in sputum production, increase in shortness of breath or activity intolerance. If you notice these symptoms, please call to be seen.    Please contact office for sooner follow up if symptoms do not improve  or worsen or seek emergency care    I spent 30 minutes dedicated to the care of this patient on the date of this encounter to include pre-visit review of records, face-to-face time with the patient discussing conditions above, post visit ordering of testing, clinical documentation with the electronic health record, making appropriate referrals as documented, and communicating necessary information to the patient's healthcare team.    Lauraine JULIANNA Lites, NP 08/02/2023  10:25 AM

## 2023-08-02 NOTE — Patient Instructions (Signed)
 Full PFT performed today.

## 2023-08-05 ENCOUNTER — Other Ambulatory Visit: Payer: Self-pay | Admitting: Acute Care

## 2023-08-05 DIAGNOSIS — R0609 Other forms of dyspnea: Secondary | ICD-10-CM

## 2023-08-07 ENCOUNTER — Telehealth: Payer: Self-pay | Admitting: Nurse Practitioner

## 2023-08-07 ENCOUNTER — Telehealth: Payer: Self-pay

## 2023-08-07 NOTE — Telephone Encounter (Signed)
 Patient has an upcoming appointment clearance can be addressed at office visit and note has been made on appointment line that clearance is needed

## 2023-08-07 NOTE — Telephone Encounter (Signed)
   Pre-operative Risk Assessment    Patient Name: Caleb House  DOB: 07/22/1950 MRN: [id number]   Date of last office visit: 02/12/23 Date of next office visit: 08/29/23   Request for Surgical Clearance    Procedure:  R shoulder arthroscopic rotator cuff repair vs debridement subacromial decompression, distal clavicle excision   Date of Surgery:  Clearance 09/03/23                                 Surgeon:  Eva Herring, MD  Surgeon's Group or Practice Name:  [facility]  Phone number:  [phone] Fax number:  [phone]   Type of Clearance Requested:   - Medical  - Pharmacy:  Hold Aspirin and Clopidogrel  (Plavix ) Not indicated    Type of Anesthesia:  Choice    Additional requests/questions:    [name]   08/07/2023, 4:46 PM

## 2023-08-07 NOTE — Telephone Encounter (Signed)
 noted

## 2023-08-07 NOTE — Telephone Encounter (Signed)
 Dr Dozier is doing the surgery for shoulder he is with [facility] states he went to appointment this morning and they told him he will need clearance from PCP.SABRA Scheduled an OV for first available added to waitlist.

## 2023-08-07 NOTE — Telephone Encounter (Signed)
   Name: Caleb House  DOB: January 10, 1951  MRN: [id number]  Primary Cardiologist: Lynwood Schilling, MD  Chart reviewed as part of pre-operative protocol coverage. The patient has an upcoming visit scheduled with Dr. Schilling on 08/29/2023 at which time clearance can be addressed in case there are any issues that would impact surgical recommendations.  R shoulder arthroscopic rotator cuff repair vs debridement subacromial decompression, distal clavicle excision Is not scheduled until 09/03/2023 as below. I added preop FYI to appointment note so that provider is aware to address at time of outpatient visit.  Per office protocol the cardiology provider should forward their finalized clearance decision and recommendations regarding antiplatelet therapy to the requesting party below.    Dr Schilling for input on holding ASA and Plavix  as requested below so that this information is available to the clearing provider at time of patient's appointment.   I will route this message as FYI to requesting party and remove this message from the preop box as separate preop APP input not needed at this time.   Please call with any questions.  Lamarr Satterfield, NP  08/07/2023, 4:53 PM

## 2023-08-12 ENCOUNTER — Telehealth: Payer: Self-pay | Admitting: Acute Care

## 2023-08-12 ENCOUNTER — Encounter: Payer: Self-pay | Admitting: Nurse Practitioner

## 2023-08-12 ENCOUNTER — Ambulatory Visit: Admitting: Nurse Practitioner

## 2023-08-12 VITALS — BP 124/60 | HR 64 | Temp 97.5°F | Ht 71.0 in | Wt 181.0 lb

## 2023-08-12 DIAGNOSIS — Z01818 Encounter for other preprocedural examination: Secondary | ICD-10-CM

## 2023-08-12 DIAGNOSIS — N183 Chronic kidney disease, stage 3 unspecified: Secondary | ICD-10-CM

## 2023-08-12 DIAGNOSIS — R7989 Other specified abnormal findings of blood chemistry: Secondary | ICD-10-CM

## 2023-08-12 NOTE — Assessment & Plan Note (Signed)
 -  Pending repeat BMP

## 2023-08-12 NOTE — Patient Instructions (Signed)
 Nice to see you today Keep your appointment as scheduled with me Follow up with me sooner if needed

## 2023-08-12 NOTE — Assessment & Plan Note (Signed)
 Patient is slightly above average risk for someone of his age for orthopedic surgery.  3% his highest risk for readmission.  As long as blood work has no big acute change patient will be cleared medically by me to have surgery.  Patient will require pulmonology clearance and cardiology clearance.

## 2023-08-12 NOTE — Telephone Encounter (Signed)
 Fax received from Dr. Eva Herring with [facility] to perform a right shoulder arthroscopic rotator cuff repair vs debridement, subacromial decompression, distal clavicle excision on patient.  Patient needs surgery clearance. Surgery is 09/03/23. Patient was seen on 08/02/23. Office protocol is a risk assessment can be sent to surgeon if patient has been seen in 60 days or less.   Sending to Lauraine for risk assessment or recommendations if patient needs to be seen in office prior to surgical procedure.

## 2023-08-12 NOTE — Assessment & Plan Note (Signed)
 Pending repeat CBC today.

## 2023-08-12 NOTE — Progress Notes (Signed)
   Acute Office Visit  Subjective:     Patient ID: Caleb House, male    DOB: 23-Feb-1950, 73 y.o.   MRN: [id number]  Chief Complaint  Patient presents with   Pre-op Exam    For right shoulder/rotator cuff on 09/03/23.     HPI Patient is in today for preoperative clearance. Patient's past medical history inclusive of HTN, CAD, CHF, cardiomyopathy, COPD, CKD, HLD.  Patient is followed by pulmonology and cardiology.  Patient is scheduled to undergo Right shoulder arthroscopic rotator Repair versus debridement, subacromial decompression, distal clavicle excision.  This will be done by Dr. Eva Herring at [facility].  Patient has an appointment for clearance forms out to cardiology and pulmonology.  Trouble with anesthesia: None  FH orPMx of malignant hyperthermia: No personal or family history  COPD: he is followed by pulmonology and I son Trelegy and does not have to use the albuterol . With extreme exertion or heavy carrying   Patient recently had echocardiogram on 07/12/2023 that showed EF of 50 to 55% with left wall motion abnormality.  Patient is followed by cardiology Dr. Lynwood Schilling  Patient also followed by pulmonology Lauraine Lites, NP Review of Systems  Constitutional:  Negative for chills and fever.  Respiratory:  Negative for shortness of breath.   Cardiovascular:  Negative for chest pain.  Neurological:  Negative for dizziness and headaches.        Objective:    BP 124/60   Pulse 64   Temp (!) 97.5 F (36.4 C) (Oral)   Ht 5' 11 (1.803 m)   Wt 181 lb (82.1 kg)   SpO2 98%   BMI 25.24 kg/m    Physical Exam Vitals and nursing note reviewed.  Constitutional:      Appearance: Normal appearance.  Cardiovascular:     Rate and Rhythm: Normal rate and regular rhythm.     Heart sounds: Normal heart sounds.  Pulmonary:     Effort: Pulmonary effort is normal.     Comments: Diminished globally Neurological:     Mental Status: He is alert.     No  results found for any visits on 08/12/23.      Assessment & Plan:   Problem List Items Addressed This Visit       Genitourinary   Chronic renal impairment, stage 3 (moderate) (HCC)   Pending repeat BMP      Relevant Orders   Basic metabolic panel with GFR     Other   Preoperative clearance - Primary   Patient is slightly above average risk for someone of his age for orthopedic surgery.  3% his highest risk for readmission.  As long as blood work has no big acute change patient will be cleared medically by me to have surgery.  Patient will require pulmonology clearance and cardiology clearance.      Relevant Orders   CBC   Basic metabolic panel with GFR   Abnormal CBC   Pending repeat CBC today      Relevant Orders   CBC    No orders of the defined types were placed in this encounter.   Return if symptoms worsen or fail to improve.  Adina Crandall, NP

## 2023-08-13 LAB — BASIC METABOLIC PANEL WITH GFR
BUN: 32 mg/dL — ABNORMAL HIGH (ref 6–23)
CO2: 27 meq/L (ref 19–32)
Calcium: 9.2 mg/dL (ref 8.4–10.5)
Chloride: 108 meq/L (ref 96–112)
Creatinine, Ser: 1.85 mg/dL — ABNORMAL HIGH (ref 0.40–1.50)
GFR: 35.35 mL/min — ABNORMAL LOW (ref >60.00)
Glucose, Bld: 104 mg/dL — ABNORMAL HIGH (ref 70–99)
Potassium: 4.4 meq/L (ref 3.5–5.1)
Sodium: 142 meq/L (ref 135–145)

## 2023-08-13 LAB — CBC
HCT: 43.2 % (ref 39.0–52.0)
Hemoglobin: 13.9 g/dL (ref 13.0–17.0)
MCHC: 32.2 g/dL (ref 30.0–36.0)
MCV: 88.7 fl (ref 78.0–100.0)
Platelets: 179 K/uL (ref 150.0–400.0)
RBC: 4.87 Mil/uL (ref 4.22–5.81)
RDW: 14.5 % (ref 11.5–15.5)
WBC: 5.5 K/uL (ref 4.0–10.5)

## 2023-08-14 ENCOUNTER — Ambulatory Visit: Payer: Self-pay | Admitting: Nurse Practitioner

## 2023-08-22 ENCOUNTER — Other Ambulatory Visit: Payer: Self-pay | Admitting: Cardiology

## 2023-08-27 ENCOUNTER — Ambulatory Visit: Admitting: Nurse Practitioner

## 2023-08-28 ENCOUNTER — Telehealth: Payer: Self-pay

## 2023-08-28 NOTE — Progress Notes (Unsigned)
 Cardiology Office Note:   Date:  08/29/2023  ID:  Caleb House, DOB May 31, 1950, MRN [id number] PCP: Wendee Lynwood HERO, NP  [facility] Providers Cardiologist:  Lynwood Schilling, MD {  History of Present Illness:   Caleb House is a 73 y.o. male who is referred by Velma Raisin, MD for evaluation of CAD.  He came for [facility].   He has had severe coronary artery disease.  He had LAD and RCA chronic total obstruction and high-grade disease in his circumflex treated with PCI on September 09, 2018. He was turned down for bypass because of his severe COPD.  His ejection fraction in July 2020 was 38%.  He did have PET scanning that demonstrated moderate sized full-thickness defect in the mid to distal anterior wall and apex.  He had viable anterior and lateral wall.  He had LAD CTO revascularization.  He had ADR/Sting-Ray on October 23, 2018.   He had staged RCA CTO revascularization on January 01, 2019.  Of note he could not tolerate Brilinta secondary to shortness of breath.  His ejection fraction was 38%.   He had hyperkalemia with spironolactone .   I did try to titrate his Entresto  but he did not tolerate this so he had a back down.  He was having lightheadedness.   Since I last saw him he has had no new cardiovascular complaints.  He did have some shortness of breath 1 time and went to see his pulmonologist.  They did order the echo done most recently which actually suggested that his ejection fraction was 50 to 55%. This was slightly higher than previous.  He was treated with Trelegy for shortness of breath and he says he has not had any problems since then. The patient denies any new symptoms such as chest discomfort, neck or arm discomfort. There has been no new shortness of breath, PND or orthopnea. There have been no reported palpitations, presyncope or syncope.  He walks 2 to 3 miles a day for exercise.   ROS: As stated in the HPI and negative for all other systems.  Studies Reviewed:     EKG:   EKG Interpretation Date/Time:  Thursday August 29 2023 08:42:31 EDT Ventricular Rate:  53 PR Interval:    QRS Duration:  154 QT Interval:  438 QTC Calculation: 410 R Axis:   -34  Text Interpretation: NSR Left axis deviation Right bundle branch block Cannot rule out Inferior infarct , age undetermined T wave abnormality, consider lateral ischemia When compared with ECG of 12-Feb-2023 14:42, No significant change since last tracing Confirmed by Schilling Lynwood ([id number]) on 08/29/2023 8:48:04 AM     Risk Assessment/Calculations:         Physical Exam:   VS:  BP (!) 160/88 (BP Location: Left Arm, Patient Position: Sitting, Cuff Size: Normal)   Pulse (!) 53   Ht 5' 10 (1.778 m)   Wt 184 lb 1.6 oz (83.5 kg)   SpO2 94%   BMI 26.42 kg/m    Wt Readings from Last 3 Encounters:  08/29/23 184 lb 1.6 oz (83.5 kg)  08/12/23 181 lb (82.1 kg)  08/02/23 182 lb (82.6 kg)     GEN: Well nourished, well developed in no acute distress NECK: No JVD; No carotid bruits CARDIAC: RRR, no murmurs, rubs, gallops RESPIRATORY:  Clear to auscultation without rales, wheezing or rhonchi  ABDOMEN: Soft, non-tender, non-distended EXTREMITIES:  No edema; No deformity   ASSESSMENT AND PLAN:   CAD:   The patient  has no new sypmtoms.  No further cardiovascular testing is indicated.  We will continue with aggressive risk reduction and meds as listed.   SOB:   Patient denies any active shortness of breath.  No change in therapy.   DYSLIPIDEMIA:   LDL 38.  No change in therapy.   CARDIOMYOPATHY:    Ejection fraction was 50 - 55% on echo in June 2025.  He has not tolerated med titration.  He did not tolerate increased Entresto .  He had hyperkalemia with spironolactone .  He did not tolerate BiDil.  He seems to be euvolemic.  No change in therapy.   CAROTID STENOSIS:   This was  40 to 59% stenosis on the left in September 2024.     He will have repeat testing later this year.    CKD:   Creat was 1.85  which is actually better than previous.  No change in therapy.   PREOP: The patient has no high risk features.  He has no unstable symptoms or signs.  He is not going for high risk surgery.  He has a high functional level.  No further cardiovascular testing is indicated according to ACC/AHA guidelines.  However, because of his severe coronary artery disease and he is mildly reduced ejection fraction he is at moderate risk for cardiovascular complications but no change in therapy would obviate this.  He and I had this discussion and he absolutely wants to proceed with surgery and he has no absolute contraindication.    Follow up ***  Signed, Lynwood Schilling, MD

## 2023-08-28 NOTE — Telephone Encounter (Unsigned)
 Copied from CRM [id number]. Topic: Clinical - Medical Advice >> Aug 27, 2023  1:43 PM Joesph PARAS wrote: Reason for CRM: Vina with [facility] is calling to request that surgical clearance for shoulder be completed and sent back ASAP, as patient's surgery is 08/12 and the surgery center needs that paperwork ten days prior to surgery in order to proceed. Paperwork is past that 10 day indication, therefore [facility] requesting be completed ASAP. No update present in chart since 07/21.   C/B at [phone] for questions.

## 2023-08-29 ENCOUNTER — Other Ambulatory Visit: Payer: Self-pay | Admitting: Orthopedic Surgery

## 2023-08-29 ENCOUNTER — Encounter: Payer: Self-pay | Admitting: Cardiology

## 2023-08-29 ENCOUNTER — Ambulatory Visit: Attending: Cardiology | Admitting: Cardiology

## 2023-08-29 VITALS — BP 160/88 | HR 53 | Ht 70.0 in | Wt 184.1 lb

## 2023-08-29 DIAGNOSIS — E785 Hyperlipidemia, unspecified: Secondary | ICD-10-CM | POA: Diagnosis present

## 2023-08-29 DIAGNOSIS — N1831 Chronic kidney disease, stage 3a: Secondary | ICD-10-CM | POA: Diagnosis present

## 2023-08-29 DIAGNOSIS — Z01818 Encounter for other preprocedural examination: Secondary | ICD-10-CM | POA: Diagnosis present

## 2023-08-29 DIAGNOSIS — I2510 Atherosclerotic heart disease of native coronary artery without angina pectoris: Secondary | ICD-10-CM | POA: Diagnosis present

## 2023-08-29 DIAGNOSIS — R0602 Shortness of breath: Secondary | ICD-10-CM | POA: Insufficient documentation

## 2023-08-29 NOTE — Patient Instructions (Signed)

## 2023-08-29 NOTE — Telephone Encounter (Signed)
 duplicate

## 2023-08-29 NOTE — Telephone Encounter (Signed)
 Per Lauraine:  He had worsening shortness of breath at the last visit that may be related to his discontinuation of Entresto . I would prefer to see in office to clear as he was having issues. Has he had cardiology clearance?       He was seen by cards today, Dr. Lavona- per notes regarding clearance for surgery:  PREOP: The patient has no high risk features.  He has no unstable symptoms or signs.  He is not going for high risk surgery.  He has a high functional level.  No further cardiovascular testing is indicated according to ACC/AHA guidelines.  However, because of his severe coronary artery disease and he is mildly reduced ejection fraction he is at moderate risk for cardiovascular complications but no change in therapy would obviate this.  He and I had this discussion and he absolutely wants to proceed with surgery and he has no absolute contraindication.     Lauraine- there are no openings between now and his planned procedure on 09/03/23. Do you wish to overbook to see him?

## 2023-08-29 NOTE — Telephone Encounter (Signed)
 Called patient, smoke detector going off in the background, patient request call back in one hour.

## 2023-08-29 NOTE — Telephone Encounter (Signed)
 Lauraine- please advise on the risk assessment. [facility] called to check status. His surgery is set for 09/03/23.

## 2023-09-03 NOTE — Telephone Encounter (Signed)
 Copy of this encounter was faxed to [facility]

## 2023-09-11 ENCOUNTER — Telehealth: Payer: Self-pay

## 2023-09-11 NOTE — Telephone Encounter (Signed)
   Pre-operative Risk Assessment    Patient Name: Caleb House  DOB: 09-Sep-1950 MRN: [id number]   Date of last office visit: 08/29/23 LYNWOOD SCHILLING, MD Date of next office visit: NONE   Request for Surgical Clearance    Procedure:  RIGHT SHOULDER ARTHROSCOPIC ROTATOR CUFF REPAIR VS DEBRIDEMENT SUBACROMIAL DECOMPRESSION, DISTAL CLAVICLE EXISION  Date of Surgery:  Clearance 10/10/23                                Surgeon:  EVA HERRING, MD Surgeon's Group or Practice Name:  [facility] Phone number:  [phone] Fax number:  [phone]   Type of Clearance Requested:   - Medical  - Pharmacy:  Hold Aspirin and Clopidogrel  (Plavix )     Type of Anesthesia:  CHOICE   Additional requests/questions:    Bonney Lucie DELENA Alvia   09/11/2023, 12:08 PM

## 2023-09-11 NOTE — Telephone Encounter (Signed)
   Patient Name: Caleb House  DOB: 1950/04/12 MRN: [id number]  Primary Cardiologist: Lynwood Schilling, MD  Chart reviewed as part of pre-operative protocol coverage. Given past medical history and time since last visit, based on ACC/AHA guidelines, Caleb House is at acceptable risk for the planned procedure without further cardiovascular testing.   Per Dr. Schilling on 08/29/2023 PREOP: The patient has no high risk features.  He has no unstable symptoms or signs.  He is not going for high risk surgery.  He has a high functional level.  No further cardiovascular testing is indicated according to ACC/AHA guidelines.  However, because of his severe coronary artery disease and he is mildly reduced ejection fraction he is at moderate risk for cardiovascular complications but no change in therapy would obviate this.  He and I had this discussion and he absolutely wants to proceed with surgery and he has no absolute contraindication.    Per office protocol, if patient is without any new symptoms or concerns at the time of their virtual visit, he may hold Plavix  for 5 days, and Aspirin  for 7 days prior to procedure. Please resume Aspirin and Plavix   as soon as possible postprocedure, at the discretion of the surgeon.    The patient was advised that if he develops new symptoms prior to surgery to contact our office to arrange for a follow-up visit, and he verbalized understanding.  I will route this recommendation to the requesting party via Epic fax function and remove from pre-op pool.  Please call with questions.  Lamarr Satterfield, NP 09/11/2023, 1:58 PM

## 2023-09-26 ENCOUNTER — Encounter: Payer: Self-pay | Admitting: Nurse Practitioner

## 2023-09-26 ENCOUNTER — Ambulatory Visit: Payer: Medicare Other | Admitting: Nurse Practitioner

## 2023-09-26 VITALS — BP 122/70 | HR 53 | Temp 97.5°F | Ht 70.0 in | Wt 179.8 lb

## 2023-09-26 DIAGNOSIS — E785 Hyperlipidemia, unspecified: Secondary | ICD-10-CM | POA: Diagnosis not present

## 2023-09-26 DIAGNOSIS — J449 Chronic obstructive pulmonary disease, unspecified: Secondary | ICD-10-CM

## 2023-09-26 DIAGNOSIS — N184 Chronic kidney disease, stage 4 (severe): Secondary | ICD-10-CM

## 2023-09-26 DIAGNOSIS — Z0000 Encounter for general adult medical examination without abnormal findings: Secondary | ICD-10-CM | POA: Insufficient documentation

## 2023-09-26 DIAGNOSIS — I10 Essential (primary) hypertension: Secondary | ICD-10-CM | POA: Diagnosis not present

## 2023-09-26 DIAGNOSIS — Z125 Encounter for screening for malignant neoplasm of prostate: Secondary | ICD-10-CM | POA: Insufficient documentation

## 2023-09-26 DIAGNOSIS — I2510 Atherosclerotic heart disease of native coronary artery without angina pectoris: Secondary | ICD-10-CM

## 2023-09-26 LAB — COMPREHENSIVE METABOLIC PANEL WITH GFR
ALT: 25 U/L (ref 0–53)
AST: 19 U/L (ref 0–37)
Albumin: 4.1 g/dL (ref 3.5–5.2)
Alkaline Phosphatase: 66 U/L (ref 39–117)
BUN: 29 mg/dL — ABNORMAL HIGH (ref 6–23)
CO2: 25 meq/L (ref 19–32)
Calcium: 8.9 mg/dL (ref 8.4–10.5)
Chloride: 108 meq/L (ref 96–112)
Creatinine, Ser: 1.74 mg/dL — ABNORMAL HIGH (ref 0.40–1.50)
GFR: 38.02 mL/min — ABNORMAL LOW (ref >60.00)
Glucose, Bld: 104 mg/dL — ABNORMAL HIGH (ref 70–99)
Potassium: 4.3 meq/L (ref 3.5–5.1)
Sodium: 140 meq/L (ref 135–145)
Total Bilirubin: 0.7 mg/dL (ref 0.2–1.2)
Total Protein: 6.2 g/dL (ref 6.0–8.3)

## 2023-09-26 LAB — LIPID PANEL
Cholesterol: 102 mg/dL (ref 0–200)
HDL: 43.3 mg/dL (ref >39.00)
LDL Cholesterol: 47 mg/dL (ref 0–99)
NonHDL: 58.22
Total CHOL/HDL Ratio: 2
Triglycerides: 54 mg/dL (ref 0.0–149.0)
VLDL: 10.8 mg/dL (ref 0.0–40.0)

## 2023-09-26 LAB — CBC WITH DIFFERENTIAL/PLATELET
Basophils Absolute: 0 K/uL (ref 0.0–0.1)
Basophils Relative: 0.7 % (ref 0.0–3.0)
Eosinophils Absolute: 0.1 K/uL (ref 0.0–0.7)
Eosinophils Relative: 1 % (ref 0.0–5.0)
HCT: 43.6 % (ref 39.0–52.0)
Hemoglobin: 14 g/dL (ref 13.0–17.0)
Lymphocytes Relative: 19.5 % (ref 12.0–46.0)
Lymphs Abs: 1 K/uL (ref 0.7–4.0)
MCHC: 32.2 g/dL (ref 30.0–36.0)
MCV: 89.1 fl (ref 78.0–100.0)
Monocytes Absolute: 0.5 K/uL (ref 0.1–1.0)
Monocytes Relative: 10.4 % (ref 3.0–12.0)
Neutro Abs: 3.5 K/uL (ref 1.4–7.7)
Neutrophils Relative %: 68.4 % (ref 43.0–77.0)
Platelets: 192 K/uL (ref 150.0–400.0)
RBC: 4.89 Mil/uL (ref 4.22–5.81)
RDW: 14.4 % (ref 11.5–15.5)
WBC: 5.1 K/uL (ref 4.0–10.5)

## 2023-09-26 LAB — PSA, MEDICARE: PSA: 2.67 ng/mL (ref 0.10–4.00)

## 2023-09-26 LAB — TSH: TSH: 1.64 u[IU]/mL (ref 0.35–5.50)

## 2023-09-26 NOTE — Assessment & Plan Note (Signed)
 History of same is followed by pulmonology.  Maintained on Trelegy inhaler

## 2023-09-26 NOTE — Assessment & Plan Note (Signed)
 History of the same.  Patient currently maintained on rosuvastatin  40 mg daily along with clopidogrel  75 mg daily and a baby aspirin.  Continue medication as prescribed follow-up with cardiology as recommended

## 2023-09-26 NOTE — Patient Instructions (Signed)
Nice to see you today I will be in touch with the labs once I have them Follow up with me in 1 year, sooner If you need me  

## 2023-09-26 NOTE — Progress Notes (Signed)
 Established Patient Office Visit  Subjective   Patient ID: Caleb House, male    DOB: 08-Jan-1951  Age: 73 y.o. MRN: [id number]  Chief Complaint  Patient presents with   Annual Exam    HPI  HTN/CHF: Patient currently maintained on carvedilol  6.25 mg twice daily and is followed by cardiology  CAD: Currently maintained on rosuvastatin  40 mg daily along with clopidogrel  75 mg daily and a baby aspirin daily.  He is followed by cardiology  COPD: Currently maintained on Trelegy inhaler and followed by pulmonology  BPH: Currently maintained on tamsulosin  0.4 mg daily and followed by urology  GERD: Currently maintained on Protonix  20 mg daily as needed  for complete physical and follow up of chronic conditions.  Immunizations: -Tetanus: Completed in 2022 -Influenza: refused  -Shingles: Completed Shingrix series -Pneumonia: Completed Prevnar 20 2022  Diet: Fair diet. He 2-3 meals a day and sometimes he will snakc. He will do a breakfast and then a snack in the middle of lunch  Exercise: No regular exercise. Walking every day about 3 miles   Eye exam:  not Since cataracts  Dental exam: Completes semi-annually    Colonoscopy: Completed in 10/06/2020.  Repeat colonoscopy not recommended due to patient's age Lung Cancer Screening:  annually. Up to date  PSA: Due  Sleep: goes to  bed around 10-11 and get up around 630-8. Feels rested some times. Urination will wake him up   Advanced directive: does have one       Review of Systems  Constitutional:  Negative for chills and fever.  Respiratory:  Positive for shortness of breath.   Cardiovascular:  Negative for chest pain and leg swelling.  Gastrointestinal:  Negative for abdominal pain, blood in stool, constipation, diarrhea, nausea and vomiting.       BM daily   Genitourinary:  Negative for dysuria and hematuria.       Nocturia x 1-3 intermittently  Neurological:  Negative for dizziness, tingling and headaches.   Psychiatric/Behavioral:  Negative for hallucinations and suicidal ideas.       Objective:     BP 122/70   Pulse (!) 53   Temp (!) 97.5 F (36.4 C) (Oral)   Ht 5' 10 (1.778 m)   Wt 179 lb 12.8 oz (81.6 kg)   SpO2 95%   BMI 25.80 kg/m  BP Readings from Last 3 Encounters:  09/26/23 122/70  08/29/23 (!) 160/88  08/12/23 124/60   Wt Readings from Last 3 Encounters:  09/26/23 179 lb 12.8 oz (81.6 kg)  08/29/23 184 lb 1.6 oz (83.5 kg)  08/12/23 181 lb (82.1 kg)   SpO2 Readings from Last 3 Encounters:  09/26/23 95%  08/29/23 94%  08/12/23 98%      Physical Exam Vitals and nursing note reviewed.  Constitutional:      Appearance: Normal appearance.  HENT:     Right Ear: Tympanic membrane, ear canal and external ear normal.     Left Ear: Tympanic membrane, ear canal and external ear normal.     Mouth/Throat:     Mouth: Mucous membranes are moist.     Pharynx: Oropharynx is clear.  Eyes:     Extraocular Movements: Extraocular movements intact.     Pupils: Pupils are equal, round, and reactive to light.  Cardiovascular:     Rate and Rhythm: Normal rate and regular rhythm.     Pulses: Normal pulses.     Heart sounds: Normal heart sounds.  Pulmonary:  Effort: Pulmonary effort is normal.     Breath sounds: Normal breath sounds.  Abdominal:     General: Bowel sounds are normal. There is no distension.     Palpations: There is no mass.     Tenderness: There is no abdominal tenderness.     Hernia: No hernia is present.  Musculoskeletal:     Right lower leg: No edema.     Left lower leg: No edema.  Lymphadenopathy:     Cervical: No cervical adenopathy.  Skin:    General: Skin is warm.  Neurological:     General: No focal deficit present.     Mental Status: He is alert.     Deep Tendon Reflexes:     Reflex Scores:      Bicep reflexes are 2+ on the right side and 2+ on the left side.      Patellar reflexes are 2+ on the right side and 2+ on the left side.     Comments: Bilateral upper and lower extremity strength 5/5  Psychiatric:        Mood and Affect: Mood normal.        Behavior: Behavior normal.        Thought Content: Thought content normal.        Judgment: Judgment normal.      Results for orders placed or performed in visit on 09/26/23  Comprehensive metabolic panel with GFR  Result Value Ref Range   Sodium 140 135 - 145 mEq/L   Potassium 4.3 3.5 - 5.1 mEq/L   Chloride 108 96 - 112 mEq/L   CO2 25 19 - 32 mEq/L   Glucose, Bld 104 (H) 70 - 99 mg/dL   BUN 29 (H) 6 - 23 mg/dL   Creatinine, Ser 8.25 (H) 0.40 - 1.50 mg/dL   Total Bilirubin 0.7 0.2 - 1.2 mg/dL   Alkaline Phosphatase 66 39 - 117 U/L   AST 19 0 - 37 U/L   ALT 25 0 - 53 U/L   Total Protein 6.2 6.0 - 8.3 g/dL   Albumin 4.1 3.5 - 5.2 g/dL   GFR 61.97 (L) >39.99 mL/min   Calcium  8.9 8.4 - 10.5 mg/dL  CBC with Differential/Platelet  Result Value Ref Range   WBC 5.1 4.0 - 10.5 K/uL   RBC 4.89 4.22 - 5.81 Mil/uL   Hemoglobin 14.0 13.0 - 17.0 g/dL   HCT 56.3 60.9 - 47.9 %   MCV 89.1 78.0 - 100.0 fl   MCHC 32.2 30.0 - 36.0 g/dL   RDW 85.5 88.4 - 84.4 %   Platelets 192.0 150.0 - 400.0 K/uL   Neutrophils Relative % 68.4 43.0 - 77.0 %   Lymphocytes Relative 19.5 12.0 - 46.0 %   Monocytes Relative 10.4 3.0 - 12.0 %   Eosinophils Relative 1.0 0.0 - 5.0 %   Basophils Relative 0.7 0.0 - 3.0 %   Neutro Abs 3.5 1.4 - 7.7 K/uL   Lymphs Abs 1.0 0.7 - 4.0 K/uL   Monocytes Absolute 0.5 0.1 - 1.0 K/uL   Eosinophils Absolute 0.1 0.0 - 0.7 K/uL   Basophils Absolute 0.0 0.0 - 0.1 K/uL  TSH  Result Value Ref Range   TSH 1.64 0.35 - 5.50 uIU/mL  PSA, Medicare  Result Value Ref Range   PSA 2.67 0.10 - 4.00 ng/ml  Lipid panel  Result Value Ref Range   Cholesterol 102 0 - 200 mg/dL   Triglycerides 45.9 0.0 - 149.0 mg/dL   HDL  43.30 >39.00 mg/dL   VLDL 89.1 0.0 - 59.9 mg/dL   LDL Cholesterol 47 0 - 99 mg/dL   Total CHOL/HDL Ratio 2    NonHDL 58.22       The ASCVD Risk  score (Arnett DK, et al., 2019) failed to calculate for the following reasons:   Risk score cannot be calculated because patient has a medical history suggesting prior/existing ASCVD    Assessment & Plan:   Problem List Items Addressed This Visit       Cardiovascular and Mediastinum   Essential hypertension   Patient currently maintained on carvedilol  6.25 mg twice daily.  Blood pressure controlled.  Patient is followed by cardiology.  Continue taking medication as prescribed follow-up with cardiology as recommended      Relevant Orders   Comprehensive metabolic panel with GFR (Completed)   CBC with Differential/Platelet (Completed)   TSH (Completed)   CAD (coronary artery disease)   History of the same.  Patient currently maintained on rosuvastatin  40 mg daily along with clopidogrel  75 mg daily and a baby aspirin.  Continue medication as prescribed follow-up with cardiology as recommended      Relevant Orders   Lipid panel (Completed)     Respiratory   COPD (chronic obstructive pulmonary disease) (HCC)   History of same is followed by pulmonology.  Maintained on Trelegy inhaler        Genitourinary   CKD (chronic kidney disease) stage 4, GFR 15-29 ml/min (HCC)   History of the same pending renal function today        Other   Hyperlipidemia   History of same currently maintained on rosuvastatin  40 mg daily.  Pending lipid panel      Relevant Orders   Lipid panel (Completed)   Preventative health care - Primary   Discussed age-appropriate immunizations and screening exams.  Did review patient's personal, surgical, social, family histories.  Patient up-to-date on all age-appropriate vaccinations would like.  Patient declined flu vaccine today.  Patient is up-to-date on CRC screening and lung cancer screening.  PSA today for prostate cancer screening.  Patient was given information at discharge about preventative healthcare maintenance with anticipatory guidance.       Other Visit Diagnoses       Screening for prostate cancer       Relevant Orders   PSA, Medicare (Completed)       Return in about 1 year (around 09/25/2024) for CPE and Labs.    Adina Crandall, NP

## 2023-09-26 NOTE — Assessment & Plan Note (Signed)
 Patient currently maintained on carvedilol  6.25 mg twice daily.  Blood pressure controlled.  Patient is followed by cardiology.  Continue taking medication as prescribed follow-up with cardiology as recommended

## 2023-09-26 NOTE — Assessment & Plan Note (Signed)
 Discussed age-appropriate immunizations and screening exams.  Did review patient's personal, surgical, social, family histories.  Patient up-to-date on all age-appropriate vaccinations would like.  Patient declined flu vaccine today.  Patient is up-to-date on CRC screening and lung cancer screening.  PSA today for prostate cancer screening.  Patient was given information at discharge about preventative healthcare maintenance with anticipatory guidance.

## 2023-09-26 NOTE — Assessment & Plan Note (Signed)
 History of the same pending renal function today

## 2023-09-26 NOTE — Assessment & Plan Note (Signed)
 History of same currently maintained on rosuvastatin  40 mg daily.  Pending lipid panel

## 2023-09-30 ENCOUNTER — Ambulatory Visit: Payer: Self-pay | Admitting: Nurse Practitioner

## 2023-10-03 ENCOUNTER — Ambulatory Visit: Payer: Medicare Other

## 2023-10-03 VITALS — BP 122/70 | Ht 70.0 in | Wt 178.0 lb

## 2023-10-03 DIAGNOSIS — Z0000 Encounter for general adult medical examination without abnormal findings: Secondary | ICD-10-CM | POA: Diagnosis not present

## 2023-10-03 DIAGNOSIS — Z2821 Immunization not carried out because of patient refusal: Secondary | ICD-10-CM

## 2023-10-03 NOTE — Progress Notes (Signed)
 Because this visit was a virtual/telehealth visit,  certain criteria was not obtained, such a blood pressure, CBG if applicable, and timed get up and go. Any medications not marked as taking were not mentioned during the medication reconciliation part of the visit. Any vitals not documented were not able to be obtained due to this being a telehealth visit or patient was unable to self-report a recent blood pressure reading due to a lack of equipment at home via telehealth. Vitals that have been documented are verbally provided by the patient.  This visit was performed by a medical professional under my direct supervision. I was immediately available for consultation/collaboration. I have reviewed and agree with the Annual Wellness Visit documentation.  Subjective:   Caleb House is a 73 y.o. who presents for a Medicare Wellness preventive visit.  As a reminder, Annual Wellness Visits don't include a physical exam, and some assessments may be limited, especially if this visit is performed virtually. We may recommend an in-person follow-up visit with your provider if needed.  Visit Complete: Virtual I connected with  Caleb House on 10/03/23 by a audio enabled telemedicine application and verified that I am speaking with the correct person using two identifiers.  Patient Location: Home  Provider Location: Home Office  I discussed the limitations of evaluation and management by telemedicine. The patient expressed understanding and agreed to proceed.  Vital Signs: Because this visit was a virtual/telehealth visit, some criteria may be missing or patient reported. Any vitals not documented were not able to be obtained and vitals that have been documented are patient reported.  VideoDeclined- This patient declined Librarian, academic. Therefore the visit was completed with audio only.  Persons Participating in Visit: Patient.  AWV Questionnaire: Yes: Patient Medicare  AWV questionnaire was completed by the patient on 09/29/2023; I have confirmed that all information answered by patient is correct and no changes since this date.  Cardiac Risk Factors include: male gender;advanced age (>95men, >16 women);hypertension;dyslipidemia     Objective:    Today's Vitals   10/03/23 0820  BP: 122/70  Weight: 178 lb (80.7 kg)  Height: 5' 10 (1.778 m)   Body mass index is 25.54 kg/m.     10/03/2023    8:24 AM 09/26/2022    8:27 AM 09/13/2022    8:17 AM 09/12/2022    5:00 PM 09/12/2022    4:39 PM 09/19/2021    9:58 AM 10/06/2020    7:21 AM  Advanced Directives  Does Patient Have a Medical Advance Directive? Yes Yes   Yes No Yes  Type of Estate agent of [place];Living will Healthcare Power of [place];Living will Healthcare Power of [place];Living will    Healthcare Power of [place];Living will  Does patient want to make changes to medical advance directive? No - Patient declined  No - Patient declined No - Patient declined     Copy of Healthcare Power of Attorney in Chart? No - copy requested No - copy requested     No - copy requested  Would patient like information on creating a medical advance directive?      No - Patient declined     Current Medications (verified) Outpatient Encounter Medications as of 10/03/2023  Medication Sig   acetaminophen  (TYLENOL ) 500 MG tablet Take 500 mg by mouth every 6 (six) hours as needed (pain.).   albuterol  (VENTOLIN  HFA) 108 (90 Base) MCG/ACT inhaler INHALE 1-2 PUFFS BY MOUTH EVERY 6 HOURS AS NEEDED FOR WHEEZE OR  SHORTNESS OF BREATH   aspirin EC 81 MG tablet Take 81 mg by mouth in the morning. Swallow whole.   carboxymethylcellulose (REFRESH TEARS) 0.5 % SOLN Place 1-2 drops into both eyes 3 (three) times daily as needed (watery/irritated eyes.).   carvedilol  (COREG ) 6.25 MG tablet TAKE 1 TABLET BY MOUTH TWICE A DAY   clopidogrel  (PLAVIX ) 75 MG tablet TAKE 1 TABLET BY MOUTH EVERY DAY    Fluticasone-Umeclidin-Vilant (TRELEGY ELLIPTA ) 100-62.5-25 MCG/ACT AEPB Inhale 1 puff into the lungs daily.   hydrocortisone cream 1 % Apply 1 Application topically 2 (two) times daily as needed for itching (irritation.).   Multiple Vitamins-Minerals (PRESERVISION AREDS 2 PO) Take 1 tablet by mouth in the morning and at bedtime.   pantoprazole  (PROTONIX ) 20 MG tablet Take 1 tablet (20 mg total) by mouth daily. (Patient taking differently: Take 20 mg by mouth daily as needed for heartburn or indigestion.)   rosuvastatin  (CRESTOR ) 40 MG tablet TAKE 1 TABLET BY MOUTH EVERY DAY (Patient taking differently: Take 40 mg by mouth at bedtime.)   tamsulosin  (FLOMAX ) 0.4 MG CAPS capsule Take 1 capsule (0.4 mg total) by mouth daily after supper.   No facility-administered encounter medications on file as of 10/03/2023.    Allergies (verified) Brilinta [ticagrelor], Entresto  [sacubitril -valsartan ], Isosorbide  mononitrate [isosorbide  nitrate], and Spironolactone    History: Past Medical History:  Diagnosis Date   CAD (coronary artery disease)    CHF (congestive heart failure) (HCC)    HTN (hypertension)    Past Surgical History:  Procedure Laterality Date   CARDIAC CATHETERIZATION     7 stents placed   cataracts  Bilateral 01/2022   COLONOSCOPY WITH PROPOFOL  N/A 10/06/2020   Procedure: COLONOSCOPY WITH PROPOFOL ;  Surgeon: Therisa Bi, MD;  Location: [facility] ENDOSCOPY;  Service: Gastroenterology;  Laterality: N/A;   KNEE SURGERY     Right knee   TONSILLECTOMY     Family History  Problem Relation Age of Onset   Depression Mother    Alcohol abuse Mother    Suicidality Mother    Heart disease Father        No details   Heart disease Brother        No details   Suicidality Maternal Grandfather    Heart attack Paternal Grandfather 74   Social History   Socioeconomic History   Marital status: Divorced    Spouse name: Not on file   Number of children: 1   Years of education: college   Highest  education level: Associate degree: academic program  Occupational History   Not on file  Tobacco Use   Smoking status: Former    Current packs/day: 0.00    Average packs/day: 0.8 packs/day for 40.0 years (30.0 ttl pk-yrs)    Types: Cigarettes    Start date: 08/23/1978    Quit date: 08/23/2018    Years since quitting: 5.1   Smokeless tobacco: Never  Vaping Use   Vaping status: Never Used  Substance and Sexual Activity   Alcohol use: Not Currently    Comment: quit drinking 9 years ago   Drug use: Never   Sexual activity: Not Currently  Other Topics Concern   Not on file  Social History Narrative   09/02/19   From: [place] , moved from [facility] most recently   Living: alone   Work: retired - Emergency planning/management officer      Family: Daughter - Wanda - lives nearby, 2 grandchildren and 1 great-grandson      Enjoys: Web designer  Exercise: total gym - 1 hour 5 days a week - strength training    Diet: good overall       Safety   Seat belts: Yes    Guns: Yes  and secure   Safe in relationships: Yes    Social Drivers of Health   Financial Resource Strain: Low Risk  (10/03/2023)   Overall Financial Resource Strain (CARDIA)    Difficulty of Paying Living Expenses: Not hard at all  Food Insecurity: No Food Insecurity (10/03/2023)   Hunger Vital Sign    Worried About Running Out of Food in the Last Year: Never true    Ran Out of Food in the Last Year: Never true  Transportation Needs: No Transportation Needs (10/03/2023)   PRAPARE - Administrator, Civil Service (Medical): No    Lack of Transportation (Non-Medical): No  Physical Activity: Sufficiently Active (10/03/2023)   Exercise Vital Sign    Days of Exercise per Week: 5 days    Minutes of Exercise per Session: 30 min  Stress: No Stress Concern Present (10/03/2023)   [employer] of Occupational Health - Occupational Stress Questionnaire    Feeling of Stress: Not at all  Social Connections: Socially Isolated (10/03/2023)    Social Connection and Isolation Panel    Frequency of Communication with Friends and Family: More than three times a week    Frequency of Social Gatherings with Friends and Family: More than three times a week    Attends Religious Services: Never    Database administrator or Organizations: No    Attends Engineer, structural: Never    Marital Status: Divorced    Tobacco Counseling Counseling given: Not Answered    Clinical Intake:  Pre-visit preparation completed: Yes  Pain : No/denies pain     BMI - recorded: 25.54 Nutritional Status: BMI 25 -29 Overweight Nutritional Risks: None Diabetes: No  No results found for: HGBA1C   How often do you need to have someone help you when you read instructions, pamphlets, or other written materials from your doctor or pharmacy?: 1 - Never  Interpreter Needed?: No  Information entered by :: Robbi Spells,CMA   Activities of Daily Living     09/29/2023    9:07 AM 09/26/2023    8:53 AM  In your present state of health, do you have any difficulty performing the following activities:  Hearing? 0 0  Vision? 0 0  Difficulty concentrating or making decisions? 0 0  Walking or climbing stairs? 0 0  Dressing or bathing? 0 0  Doing errands, shopping? 0 0  Preparing Food and eating ? N   Using the Toilet? N   In the past six months, have you accidently leaked urine? N   Do you have problems with loss of bowel control? N   Managing your Medications? N   Managing your Finances? N   Housekeeping or managing your Housekeeping? N     Patient Care Team: Wendee Lynwood HERO, NP as PCP - General (Nurse Practitioner) Lavona Lynwood, MD as PCP - Cardiology (Cardiology)  I have updated your Care Teams any recent Medical Services you may have received from other providers in the past year.     Assessment:   This is a routine wellness examination for [place].  Hearing/Vision screen Hearing Screening - Comments:: No  difficulties Vision Screening - Comments:: Patient wears readers    Goals Addressed  This Visit's Progress    DIET - EAT MORE FRUITS AND VEGETABLES   On track      Depression Screen     10/03/2023    8:25 AM 09/26/2023    8:58 AM 08/12/2023    3:28 PM 04/12/2023   12:16 PM 04/12/2023   12:10 PM 09/26/2022    8:20 AM 09/25/2022    9:26 AM  PHQ 2/9 Scores  PHQ - 2 Score 0 0 0 0 0 0 0  PHQ- 9 Score 0 0 2 2 2  0 3    Fall Risk     09/29/2023    9:07 AM 09/26/2023    8:58 AM 08/12/2023    3:28 PM 05/31/2023    8:19 AM 04/12/2023   12:17 PM  Fall Risk   Falls in the past year? 0 0 0 0 0  Number falls in past yr: 0 0 0  0  Injury with Fall? 0 0 0  0  Risk for fall due to : No Fall Risks No Fall Risks No Fall Risks  No Fall Risks  Follow up Falls evaluation completed Falls evaluation completed Falls evaluation completed  Falls evaluation completed    MEDICARE RISK AT HOME:  Medicare Risk at Home Any stairs in or around the home?: No If so, are there any without handrails?: No Home free of loose throw rugs in walkways, pet beds, electrical cords, etc?: (Patient-Rptd) Yes Adequate lighting in your home to reduce risk of falls?: (Patient-Rptd) Yes Life alert?: (Patient-Rptd) No Use of a cane, walker or w/c?: (Patient-Rptd) No Grab bars in the bathroom?: (Patient-Rptd) No Shower chair or bench in shower?: (Patient-Rptd) Yes Elevated toilet seat or a handicapped toilet?: (Patient-Rptd) No  TIMED UP AND GO:  Was the test performed?  No  Cognitive Function: 6CIT completed        10/03/2023    8:23 AM 09/26/2022    8:28 AM 09/19/2021   10:08 AM  6CIT Screen  What Year? 0 points 0 points 0 points  What month? 0 points 0 points 0 points  What time? 0 points 0 points 0 points  Count back from 20 0 points 0 points 0 points  Months in reverse 0 points 0 points 0 points  Repeat phrase 0 points 0 points 0 points  Total Score 0 points 0 points 0 points     Immunizations Immunization History  Administered Date(s) Administered   PFIZER(Purple Top)SARS-COV-2 Vaccination 02/19/2019, 03/13/2019, 11/05/2019   PNEUMOCOCCAL CONJUGATE-20 09/14/2020   Tdap 09/21/2020   Zoster Recombinant(Shingrix) 09/21/2020, 11/28/2020   Zoster, Live 11/28/2020    Screening Tests Health Maintenance  Topic Date Due   COVID-19 Vaccine (4 - 2025-26 season) 09/23/2023   Influenza Vaccine  04/21/2024 (Originally 08/23/2023)   Lung Cancer Screening  12/10/2023   Medicare Annual Wellness (AWV)  10/02/2024   DTaP/Tdap/Td (2 - Td or Tdap) 09/22/2030   Colonoscopy  10/07/2030   Pneumococcal Vaccine: 50+ Years  Completed   Hepatitis C Screening  Completed   Zoster Vaccines- Shingrix  Completed   HPV VACCINES  Aged Out   Meningococcal B Vaccine  Aged Out    Health Maintenance Items Addressed:patient declined vaccination  Additional Screening:  Vision Screening: Recommended annual ophthalmology exams for early detection of glaucoma and other disorders of the eye. Is the patient up to date with their annual eye exam?  No  Who is the provider or what is the name of the office in which the patient attends  annual eye exams?   Dental Screening: Recommended annual dental exams for proper oral hygiene  Community Resource Referral / Chronic Care Management: CRR required this visit?  No   CCM required this visit?  No   Plan:    I have personally reviewed and noted the following in the patient's chart:   Medical and social history Use of alcohol, tobacco or illicit drugs  Current medications and supplements including opioid prescriptions. Patient is not currently taking opioid prescriptions. Functional ability and status Nutritional status Physical activity Advanced directives List of other physicians Hospitalizations, surgeries, and ER visits in previous 12 months Vitals Screenings to include cognitive, depression, and falls Referrals and  appointments  In addition, I have reviewed and discussed with patient certain preventive protocols, quality metrics, and best practice recommendations. A written personalized care plan for preventive services as well as general preventive health recommendations were provided to patient.   Lyle MARLA Right, [place]   10/03/2023   After Visit Summary: (MyChart) Due to this being a telephonic visit, the after visit summary with patients personalized plan was offered to patient via MyChart   Notes: Nothing significant to report at this time.

## 2023-10-03 NOTE — Patient Instructions (Signed)
 Caleb House,  Thank you for taking the time for your Medicare Wellness Visit. I appreciate your continued commitment to your health goals. Please review the care plan we discussed, and feel free to reach out if I can assist you further.  Medicare recommends these wellness visits once per year to help you and your care team stay ahead of potential health issues. These visits are designed to focus on prevention, allowing your provider to concentrate on managing your acute and chronic conditions during your regular appointments.  Please note that Annual Wellness Visits do not include a physical exam. Some assessments may be limited, especially if the visit was conducted virtually. If needed, we may recommend a separate in-person follow-up with your provider.  Ongoing Care Seeing your primary care provider every 3 to 6 months helps us  monitor your health and provide consistent, personalized care.   Referrals If a referral was made during today's visit and you haven't received any updates within two weeks, please contact the referred provider directly to check on the status.  Recommended Screenings:  Health Maintenance  Topic Date Due   COVID-19 Vaccine (4 - 2025-26 season) 09/23/2023   Flu Shot  04/21/2024*   Screening for Lung Cancer  12/10/2023   Medicare Annual Wellness Visit  10/02/2024   DTaP/Tdap/Td vaccine (2 - Td or Tdap) 09/22/2030   Colon Cancer Screening  10/07/2030   Pneumococcal Vaccine for age over 45  Completed   Hepatitis C Screening  Completed   Zoster (Shingles) Vaccine  Completed   HPV Vaccine  Aged Out   Meningitis B Vaccine  Aged Out  *Topic was postponed. The date shown is not the original due date.       10/03/2023    8:24 AM  Advanced Directives  Does Patient Have a Medical Advance Directive? Yes  Type of Estate agent of [place];Living will  Does patient want to make changes to medical advance directive? No - Patient declined  Copy of  Healthcare Power of Attorney in Chart? No - copy requested   Advance Care Planning is important because it: Ensures you receive medical care that aligns with your values, goals, and preferences. Provides guidance to your family and loved ones, reducing the emotional burden of decision-making during critical moments.  Vision: Annual vision screenings are recommended for early detection of glaucoma, cataracts, and diabetic retinopathy. These exams can also reveal signs of chronic conditions such as diabetes and high blood pressure.  Dental: Annual dental screenings help detect early signs of oral cancer, gum disease, and other conditions linked to overall health, including heart disease and diabetes.  Please see the attached documents for additional preventive care recommendations.

## 2023-10-03 NOTE — Progress Notes (Signed)
 COVID Vaccine Completed: yes  Date of COVID positive in last 90 days:  PCP - Lynwood Crandall, NP Cardiologist - Lynwood Schilling, MD LOV 08/29/23 Pulmonologist- Lauraine Lites, NP LOV 08/02/23  Cardiac clearance by Lamarr Satterfield, NP 09/11/23 in Epic  Pulmonary clearance by Lauraine Lites in Epic 08/12/23 Medical clearance in Media tab dated 09/09/23  Chest x-ray - CT- 12/10/22 Epic EKG - 08/29/23 Epic Stress Test - 2020 ECHO - 06/25/23 Epic Cardiac Cath - 2020 Pacemaker/ICD device last checked:N/A Spinal Cord Stimulator:N/A  Bowel Prep - N/A  Sleep Study - N/A CPAP -   Fasting Blood Sugar - N/A Checks Blood Sugar _____ times a day  Last dose of GLP1 agonist-  N/A GLP1 instructions:  Do not take after     Last dose of SGLT-2 inhibitors-  N/A SGLT-2 instructions:  Do not take after     Blood Thinner Instructions: Plavix , hold 5 days Aspirin Instructions: ASA 81, instructed to continue per cards Last Dose: 10/04/23 0730  Activity level: Can go up a flight of stairs and perform activities of daily living without stopping and without symptoms of chest pain or shortness of breath.   Anesthesia review: HTN, CAD, CHF, ischemic cardiomyopathy, COPD, CKD, emphysema, stents x7  Patient denies shortness of breath, fever, cough and chest pain at PAT appointment  Patient verbalized understanding of instructions that were given to them at the PAT appointment. Patient was also instructed that they will need to review over the PAT instructions again at home before surgery.

## 2023-10-04 ENCOUNTER — Encounter (HOSPITAL_COMMUNITY): Payer: Self-pay

## 2023-10-04 ENCOUNTER — Other Ambulatory Visit: Payer: Self-pay

## 2023-10-04 ENCOUNTER — Encounter (HOSPITAL_COMMUNITY)
Admission: RE | Admit: 2023-10-04 | Discharge: 2023-10-04 | Disposition: A | Discharge status: Home / Self Care | Source: Ambulatory Visit | Attending: Orthopedic Surgery | Admitting: Orthopedic Surgery

## 2023-10-04 DIAGNOSIS — I130 Hypertensive heart and chronic kidney disease with heart failure and stage 1 through stage 4 chronic kidney disease, or unspecified chronic kidney disease: Secondary | ICD-10-CM | POA: Diagnosis not present

## 2023-10-04 DIAGNOSIS — Z01818 Encounter for other preprocedural examination: Secondary | ICD-10-CM | POA: Diagnosis present

## 2023-10-04 DIAGNOSIS — K219 Gastro-esophageal reflux disease without esophagitis: Secondary | ICD-10-CM | POA: Insufficient documentation

## 2023-10-04 DIAGNOSIS — I255 Ischemic cardiomyopathy: Secondary | ICD-10-CM | POA: Insufficient documentation

## 2023-10-04 DIAGNOSIS — J439 Emphysema, unspecified: Secondary | ICD-10-CM | POA: Insufficient documentation

## 2023-10-04 DIAGNOSIS — I5022 Chronic systolic (congestive) heart failure: Secondary | ICD-10-CM | POA: Diagnosis not present

## 2023-10-04 DIAGNOSIS — M75111 Incomplete rotator cuff tear or rupture of right shoulder, not specified as traumatic: Secondary | ICD-10-CM | POA: Diagnosis not present

## 2023-10-04 DIAGNOSIS — I2510 Atherosclerotic heart disease of native coronary artery without angina pectoris: Secondary | ICD-10-CM | POA: Diagnosis not present

## 2023-10-04 DIAGNOSIS — M19011 Primary osteoarthritis, right shoulder: Secondary | ICD-10-CM | POA: Diagnosis not present

## 2023-10-04 DIAGNOSIS — N183 Chronic kidney disease, stage 3 unspecified: Secondary | ICD-10-CM | POA: Diagnosis not present

## 2023-10-04 HISTORY — DX: Chronic obstructive pulmonary disease, unspecified: J44.9

## 2023-10-04 HISTORY — DX: Gastro-esophageal reflux disease without esophagitis: K21.9

## 2023-10-04 HISTORY — DX: Hyperlipidemia, unspecified: E78.5

## 2023-10-04 HISTORY — DX: Unspecified osteoarthritis, unspecified site: M19.90

## 2023-10-04 HISTORY — DX: Chronic kidney disease, unspecified: N18.9

## 2023-10-04 NOTE — Patient Instructions (Signed)
 SURGICAL WAITING ROOM VISITATION  Patients having surgery or a procedure may have no more than 2 support people in the waiting area - these visitors may rotate.    Children under the age of 55 must have an adult with them who is not the patient.  Visitors with respiratory illnesses are discouraged from visiting and should remain at home.  If the patient needs to stay at the hospital during part of their recovery, the visitor guidelines for inpatient rooms apply. Pre-op nurse will coordinate an appropriate time for 1 support person to accompany patient in pre-op.  This support person may not rotate.    Please refer to the [facility] website for the visitor guidelines for Inpatients (after your surgery is over and you are in a regular room).    Your procedure is scheduled on: 10/10/23   Report to [facility] Main Entrance    Report to admitting at 5:15 AM   Call this number if you have problems the morning of surgery [phone]   Do not eat food :After Midnight.   After Midnight you may have the following liquids until 4:30 AM DAY OF SURGERY  Water Non-Citrus Juices (without pulp, NO RED-Apple, White grape, White cranberry) Black Coffee (NO MILK/CREAM OR CREAMERS, sugar ok)  Clear Tea (NO MILK/CREAM OR CREAMERS, sugar ok) regular and decaf                             Plain Jell-O (NO RED)                                           Fruit ices (not with fruit pulp, NO RED)                                     Popsicles (NO RED)                                                               Sports drinks like Gatorade (NO RED)     The day of surgery:  Drink ONE (1) Pre-Surgery Clear Ensure at 4:30 AM the morning of surgery. Drink in one sitting. Do not sip.  This drink was given to you during your hospital  pre-op appointment visit. Nothing else to drink after completing the  Pre-Surgery Clear Ensure.          If you have questions, please contact your surgeon's  office.   FOLLOW BOWEL PREP AND ANY ADDITIONAL PRE OP INSTRUCTIONS YOU RECEIVED FROM YOUR SURGEON'S OFFICE!!!     Oral Hygiene is also important to reduce your risk of infection.                                    Remember - BRUSH YOUR TEETH THE MORNING OF SURGERY WITH YOUR REGULAR TOOTHPASTE  DENTURES WILL BE REMOVED PRIOR TO SURGERY PLEASE DO NOT APPLY Poly grip OR ADHESIVES!!!   Do NOT smoke after Midnight   Stop all  vitamins and herbal supplements 7 days before surgery.   Take these medicines the morning of surgery with A SIP OF WATER: Tylenol , Inhalers, Carvedilol , Pantoprazole                                You may not have any metal on your body including jewelry, and body piercing             Do not wear lotions, powders, cologne, or deodorant              Men may shave face and neck.   Do not bring valuables to the hospital. [facility] IS NOT             RESPONSIBLE   FOR VALUABLES.   Contacts, glasses, dentures or bridgework may not be worn into surgery.  DO NOT BRING YOUR HOME MEDICATIONS TO THE HOSPITAL. PHARMACY WILL DISPENSE MEDICATIONS LISTED ON YOUR MEDICATION LIST TO YOU DURING YOUR ADMISSION IN THE HOSPITAL!    Patients discharged on the day of surgery will not be allowed to drive home.  Someone NEEDS to stay with you for the first 24 hours after anesthesia.              Please read over the following fact sheets you were given: IF YOU HAVE QUESTIONS ABOUT YOUR PRE-OP INSTRUCTIONS PLEASE CALL [phone] [name].   If you received a COVID test during your pre-op visit  it is requested that you wear a mask when out in public, stay away from anyone that may not be feeling well and notify your surgeon if you develop symptoms. If you test positive for Covid or have been in contact with anyone that has tested positive in the last 10 days please notify you surgeon.    [facility] - Preparing for Surgery Before surgery, you can play an important role.  Because  skin is not sterile, your skin needs to be as free of germs as possible.  You can reduce the number of germs on your skin by washing with CHG (chlorahexidine gluconate) soap before surgery.  CHG is an antiseptic cleaner which kills germs and bonds with the skin to continue killing germs even after washing. Please DO NOT use if you have an allergy to CHG or antibacterial soaps.  If your skin becomes reddened/irritated stop using the CHG and inform your nurse when you arrive at Short Stay. Do not shave (including legs and underarms) for at least 48 hours prior to the first CHG shower.  You may shave your face/neck.  Please follow these instructions carefully:  1.  Shower with CHG Soap the night before surgery and the  morning of surgery.  2.  If you choose to wash your hair, wash your hair first as usual with your normal  shampoo.  3.  After you shampoo, rinse your hair and body thoroughly to remove the shampoo.                             4.  Use CHG as you would any other liquid soap.  You can apply chg directly to the skin and wash.  Gently with a scrungie or clean washcloth.  5.  Apply the CHG Soap to your body ONLY FROM THE NECK DOWN.   Do   not use on face/ open  Wound or open sores. Avoid contact with eyes, ears mouth and   genitals (private parts).                       Wash face,  Genitals (private parts) with your normal soap.             6.  Wash thoroughly, paying special attention to the area where your    surgery  will be performed.  7.  Thoroughly rinse your body with warm water from the neck down.  8.  DO NOT shower/wash with your normal soap after using and rinsing off the CHG Soap.                9.  Pat yourself dry with a clean towel.            10.  Wear clean pajamas.            11.  Place clean sheets on your bed the night of your first shower and do not  sleep with pets. Day of Surgery : Do not apply any lotions/deodorants the morning of surgery.   Please wear clean clothes to the hospital/surgery center.  FAILURE TO FOLLOW THESE INSTRUCTIONS MAY RESULT IN THE CANCELLATION OF YOUR SURGERY  PATIENT SIGNATURE_________________________________  NURSE SIGNATURE__________________________________  ________________________________________________________________________  Nasario Exon  An incentive spirometer is a tool that can help keep your lungs clear and active. This tool measures how well you are filling your lungs with each breath. Taking long deep breaths may help reverse or decrease the chance of developing breathing (pulmonary) problems (especially infection) following: A long period of time when you are unable to move or be active. BEFORE THE PROCEDURE  If the spirometer includes an indicator to show your best effort, your nurse or respiratory therapist will set it to a desired goal. If possible, sit up straight or lean slightly forward. Try not to slouch. Hold the incentive spirometer in an upright position. INSTRUCTIONS FOR USE  Sit on the edge of your bed if possible, or sit up as far as you can in bed or on a chair. Hold the incentive spirometer in an upright position. Breathe out normally. Place the mouthpiece in your mouth and seal your lips tightly around it. Breathe in slowly and as deeply as possible, raising the piston or the ball toward the top of the column. Hold your breath for 3-5 seconds or for as long as possible. Allow the piston or ball to fall to the bottom of the column. Remove the mouthpiece from your mouth and breathe out normally. Rest for a few seconds and repeat Steps 1 through 7 at least 10 times every 1-2 hours when you are awake. Take your time and take a few normal breaths between deep breaths. The spirometer may include an indicator to show your best effort. Use the indicator as a goal to work toward during each repetition. After each set of 10 deep breaths, practice coughing to be sure your  lungs are clear. If you have an incision (the cut made at the time of surgery), support your incision when coughing by placing a pillow or rolled up towels firmly against it. Once you are able to get out of bed, walk around indoors and cough well. You may stop using the incentive spirometer when instructed by your caregiver.  RISKS AND COMPLICATIONS Take your time so you do not get dizzy or light-headed. If you are in pain, you may  need to take or ask for pain medication before doing incentive spirometry. It is harder to take a deep breath if you are having pain. AFTER USE Rest and breathe slowly and easily. It can be helpful to keep track of a log of your progress. Your caregiver can provide you with a simple table to help with this. If you are using the spirometer at home, follow these instructions: SEEK MEDICAL CARE IF:  You are having difficultly using the spirometer. You have trouble using the spirometer as often as instructed. Your pain medication is not giving enough relief while using the spirometer. You develop fever of 100.5 F (38.1 C) or higher. SEEK IMMEDIATE MEDICAL CARE IF:  You cough up bloody sputum that had not been present before. You develop fever of 102 F (38.9 C) or greater. You develop worsening pain at or near the incision site. MAKE SURE YOU:  Understand these instructions. Will watch your condition. Will get help right away if you are not doing well or get worse. Document Released: 05/21/2006 Document Revised: 04/02/2011 Document Reviewed: 07/22/2006 [facility] Patient Information 2014 [place], [place].   ________________________________________________________________________

## 2023-10-07 NOTE — Progress Notes (Addendum)
 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  Patient follows with Cardiology for above hx. Per notes he has had 7 cardiac stents placed in 2020 in [place]. On life long DAPT. He was turned down for CABG surgery due to severe COPD. Has difficulty titrating GDMT for his CHF due to intolerance. Last echo in June 2025 showed low normal EF 50-55%. Last seen in clinic on 08/29/23 for pre op clearance. Cleared as moderate risk:  PREOP: The patient has no high risk features.  He has no unstable symptoms or signs.  He is not going for high risk surgery.  He has a high functional level.  No further cardiovascular testing is indicated according to ACC/AHA guidelines.  However, because of his severe coronary artery disease and he is mildly reduced ejection fraction he is at moderate risk for cardiovascular complications but no change in therapy would obviate this.  He and I had this discussion and he absolutely wants to proceed with surgery and he has no absolute contraindication.  Seen by Pulmonology on 08/02/23 for follow up after Echo and PFTs for worsening SOB. Pulmonary function test continues to show severe COPD, with moderately reduced DLCO. Patient's echo is actually  improved over the last echo done by cardiology.  Unfortunately the PA pressure was not able to be determined on the echo. Inhalers were changed and patient reported improvement in symptoms. Clearance given (Scanned in media on 8/12)   Ok to clear for surgery. Please fax this note to whomever needs it. Underlying lung disease increases his risk of complications but COPD is well controlled at this time. No recent flares. Compliant with medications  VS: BP (!) 157/86   Pulse (!) 55   Temp (!) 36.4 C   Resp 14   Ht 5' 10 (1.778 m)   Wt 80.7 kg   SpO2 97%   BMI 25.54 kg/m   PROVIDERS: Wendee Lynwood HERO, NP   LABS: Labs reviewed: Acceptable for surgery. CKD stable (all labs ordered are listed, but only abnormal results are displayed)  Labs Reviewed - No data to display   IMAGES:  CT Chest 12/10/2022:  IMPRESSION: 1. Lung-RADS 1, negative. Continue annual screening with low-dose chest CT without contrast in 12 months. 2. Aortic atherosclerosis (ICD10-I70.0). Coronary artery calcification. 3.  Emphysema (ICD10-J43.9).  EKG 08/29/23:  NSR Left axis deviation Right bundle branch block Cannot rule out Inferior infarct , age undetermined T wave abnormality, consider lateral ischemia When compared with ECG of 12-Feb-2023 14:42, No significant change since last tracing Confirmed by Lavona Lynwood  CV:  Echo 06/25/23:  IMPRESSIONS     1. Left ventricular ejection fraction, by estimation, is 50 to 55%. Left  ventricular ejection fraction by 3D  volume is 54 %. The left ventricle has  low normal function. The left ventricle demonstrates regional wall motion  abnormalities (see scoring  diagram/findings for description). Aneurysmal dilatation of the LV apex.  Left ventricular diastolic parameters are consistent with Grade I  diastolic dysfunction (impaired relaxation). There is akinesis of the left  ventricular, apical segment. There is  akinesis of the left ventricular,  apical septal wall. There is hypokinesis  of the left ventricular, inferoseptal wall and anteroseptal wall.   2. Right ventricular systolic function is normal. The right ventricular  size is normal. Tricuspid regurgitation signal is inadequate for assessing  PA pressure.   3. The mitral valve is normal in structure. No evidence of mitral valve  regurgitation. No evidence of mitral stenosis.   4. The aortic valve is tricuspid. Aortic valve regurgitation is mild.  Aortic valve sclerosis/calcification is present, without any evidence of  aortic stenosis. Aortic regurgitation PHT measures 619 msec.   5. The inferior vena cava is normal in size with greater than 50%  respiratory variability, suggesting right atrial pressure of 3 mmHg.   Past Medical History:  Diagnosis Date   Arthritis    CAD (coronary artery disease)    CHF (congestive heart failure) (HCC)    Chronic kidney disease    COPD (chronic obstructive pulmonary disease) (HCC)    GERD (gastroesophageal reflux disease)    HTN (hypertension)    Hyperlipidemia     Past Surgical History:  Procedure Laterality Date   CARDIAC CATHETERIZATION     7 stents placed   cataracts  Bilateral 01/2022   COLONOSCOPY WITH PROPOFOL  N/A 10/06/2020   Procedure: COLONOSCOPY WITH PROPOFOL ;  Surgeon: Therisa Bi, MD;  Location: [facility] ENDOSCOPY;  Service: Gastroenterology;  Laterality: N/A;   KNEE SURGERY     Right knee   TONSILLECTOMY      MEDICATIONS:  acetaminophen  (TYLENOL ) 500 MG tablet   albuterol  (VENTOLIN  HFA) 108 (90 Base) MCG/ACT inhaler   aspirin EC 81 MG tablet   carboxymethylcellulose (REFRESH TEARS) 0.5 % SOLN   carvedilol  (COREG ) 6.25 MG tablet   clopidogrel  (PLAVIX ) 75 MG tablet   Fluticasone-Umeclidin-Vilant (TRELEGY ELLIPTA ) 100-62.5-25 MCG/ACT AEPB   hydrocortisone cream 1 %   Multiple Vitamins-Minerals (PRESERVISION AREDS 2 PO)   pantoprazole  (PROTONIX ) 20 MG tablet   rosuvastatin  (CRESTOR ) 40 MG tablet   tamsulosin   (FLOMAX ) 0.4 MG CAPS capsule   No current facility-administered medications for this encounter.   [name] [name] [facility] Short Stay/Anesthesiology [facility] Phone [phone] 10/07/2023 11:23 AM

## 2023-10-09 NOTE — Anesthesia Preprocedure Evaluation (Signed)
 Anesthesia Evaluation  Patient identified by MRN, date of birth, ID band Patient awake    Reviewed: Allergy & Precautions, NPO status , Patient's Chart, lab work & pertinent test results, reviewed documented beta blocker date and time   History of Anesthesia Complications Negative for: history of anesthetic complications  Airway Mallampati: II  TM Distance: >3 FB Neck ROM: Full    Dental  (+) Dental Advisory Given   Pulmonary COPD, neg recent URI, former smoker   Pulmonary exam normal        Cardiovascular hypertension, + CAD  + dysrhythmias (RBBB)  Rhythm:Regular Rate:Normal  TTE (2025):  1. Left ventricular ejection fraction, by estimation, is 50 to 55%. Left  ventricular ejection fraction by 3D volume is 54 %. The left ventricle has  low normal function. The left ventricle demonstrates regional wall motion  abnormalities (see scoring  diagram/findings for description). Aneurysmal dilatation of the LV apex.  Left ventricular diastolic parameters are consistent with Grade I  diastolic dysfunction (impaired relaxation). There is akinesis of the left  ventricular, apical segment. There is  akinesis of the left ventricular, apical septal wall. There is hypokinesis  of the left ventricular, inferoseptal wall and anteroseptal wall.   2. Right ventricular systolic function is normal. The right ventricular  size is normal. Tricuspid regurgitation signal is inadequate for assessing  PA pressure.   3. The mitral valve is normal in structure. No evidence of mitral valve  regurgitation. No evidence of mitral stenosis.   4. The aortic valve is tricuspid. Aortic valve regurgitation is mild.  Aortic valve sclerosis/calcification is present, without any evidence of  aortic stenosis. Aortic regurgitation PHT measures 619 msec.   5. The inferior vena cava is normal in size with greater than 50%  respiratory variability, suggesting right  atrial pressure of 3 mmHg.      Neuro/Psych    GI/Hepatic ,GERD  ,,  Endo/Other    Renal/GU Renal InsufficiencyRenal disease     Musculoskeletal  (+) Arthritis ,    Abdominal   Peds  Hematology   Anesthesia Other Findings   Reproductive/Obstetrics                              Anesthesia Physical Anesthesia Plan  ASA: 3  Anesthesia Plan: General and Regional   Post-op Pain Management:    Induction:   PONV Risk Score and Plan: 1  Airway Management Planned: Oral ETT  Additional Equipment:   Intra-op Plan:   Post-operative Plan: Extubation in OR  Informed Consent:      Dental advisory given  Plan Discussed with: Surgeon  Anesthesia Plan Comments:          Anesthesia Quick Evaluation

## 2023-10-10 ENCOUNTER — Ambulatory Visit (HOSPITAL_COMMUNITY): Payer: Self-pay | Admitting: Certified Registered"

## 2023-10-10 ENCOUNTER — Ambulatory Visit (HOSPITAL_COMMUNITY)
Admission: RE | Admit: 2023-10-10 | Discharge: 2023-10-10 | Disposition: A | Discharge status: Home / Self Care | Source: Ambulatory Visit | Attending: Orthopedic Surgery | Admitting: Orthopedic Surgery

## 2023-10-10 ENCOUNTER — Ambulatory Visit (HOSPITAL_COMMUNITY): Payer: Self-pay | Admitting: Medical

## 2023-10-10 ENCOUNTER — Encounter (HOSPITAL_COMMUNITY)
Admission: RE | Disposition: A | Discharge status: Home / Self Care | Payer: Self-pay | Source: Ambulatory Visit | Attending: Orthopedic Surgery

## 2023-10-10 ENCOUNTER — Other Ambulatory Visit: Payer: Self-pay

## 2023-10-10 ENCOUNTER — Encounter (HOSPITAL_COMMUNITY): Payer: Self-pay | Admitting: Orthopedic Surgery

## 2023-10-10 DIAGNOSIS — I4510 Unspecified right bundle-branch block: Secondary | ICD-10-CM | POA: Insufficient documentation

## 2023-10-10 DIAGNOSIS — N189 Chronic kidney disease, unspecified: Secondary | ICD-10-CM | POA: Insufficient documentation

## 2023-10-10 DIAGNOSIS — M75111 Incomplete rotator cuff tear or rupture of right shoulder, not specified as traumatic: Secondary | ICD-10-CM

## 2023-10-10 DIAGNOSIS — K219 Gastro-esophageal reflux disease without esophagitis: Secondary | ICD-10-CM | POA: Insufficient documentation

## 2023-10-10 DIAGNOSIS — I10 Essential (primary) hypertension: Secondary | ICD-10-CM | POA: Diagnosis not present

## 2023-10-10 DIAGNOSIS — M7541 Impingement syndrome of right shoulder: Secondary | ICD-10-CM | POA: Diagnosis not present

## 2023-10-10 DIAGNOSIS — Z87891 Personal history of nicotine dependence: Secondary | ICD-10-CM | POA: Insufficient documentation

## 2023-10-10 DIAGNOSIS — E785 Hyperlipidemia, unspecified: Secondary | ICD-10-CM | POA: Insufficient documentation

## 2023-10-10 DIAGNOSIS — Z7902 Long term (current) use of antithrombotics/antiplatelets: Secondary | ICD-10-CM | POA: Insufficient documentation

## 2023-10-10 DIAGNOSIS — I2510 Atherosclerotic heart disease of native coronary artery without angina pectoris: Secondary | ICD-10-CM

## 2023-10-10 DIAGNOSIS — J449 Chronic obstructive pulmonary disease, unspecified: Secondary | ICD-10-CM | POA: Insufficient documentation

## 2023-10-10 DIAGNOSIS — Z79899 Other long term (current) drug therapy: Secondary | ICD-10-CM | POA: Diagnosis not present

## 2023-10-10 DIAGNOSIS — M19011 Primary osteoarthritis, right shoulder: Secondary | ICD-10-CM | POA: Diagnosis present

## 2023-10-10 DIAGNOSIS — Z7951 Long term (current) use of inhaled steroids: Secondary | ICD-10-CM | POA: Insufficient documentation

## 2023-10-10 DIAGNOSIS — I509 Heart failure, unspecified: Secondary | ICD-10-CM | POA: Insufficient documentation

## 2023-10-10 DIAGNOSIS — Z7982 Long term (current) use of aspirin: Secondary | ICD-10-CM | POA: Diagnosis not present

## 2023-10-10 DIAGNOSIS — I130 Hypertensive heart and chronic kidney disease with heart failure and stage 1 through stage 4 chronic kidney disease, or unspecified chronic kidney disease: Secondary | ICD-10-CM | POA: Diagnosis not present

## 2023-10-10 HISTORY — PX: POSTERIOR LUMBAR FUSION 2 WITH HARDWARE REMOVAL: SHX7297

## 2023-10-10 HISTORY — PX: SHOULDER ARTHROSCOPY WITH ROTATOR CUFF REPAIR: SHX5685

## 2023-10-10 HISTORY — PX: SUBACROMIAL DECOMPRESSION: SHX5174

## 2023-10-10 SURGERY — ARTHROSCOPY, SHOULDER, WITH ROTATOR CUFF REPAIR
Anesthesia: Regional | Laterality: Right

## 2023-10-10 MED ORDER — SUGAMMADEX SODIUM 200 MG/2ML IV SOLN
INTRAVENOUS | Status: DC | PRN
  Administered 2023-10-10: 200 mg via INTRAVENOUS

## 2023-10-10 MED ORDER — GLYCOPYRROLATE 0.2 MG/ML IJ SOLN
INTRAMUSCULAR | Status: DC | PRN
  Administered 2023-10-10: .2 mg via INTRAVENOUS

## 2023-10-10 MED ORDER — DEXAMETHASONE SODIUM PHOSPHATE 10 MG/ML IJ SOLN
INTRAMUSCULAR | Status: DC | PRN
  Administered 2023-10-10: 5 mg via INTRAVENOUS

## 2023-10-10 MED ORDER — OXYCODONE HCL 5 MG PO TABS: 5.0000 mg | ORAL_TABLET | Freq: Once | ORAL | Status: DC | PRN

## 2023-10-10 MED ORDER — SODIUM CHLORIDE 0.9 % IR SOLN
Status: DC | PRN
  Administered 2023-10-10 (×2): 3000 mL

## 2023-10-10 MED ORDER — OXYCODONE HCL 5 MG PO TABS: 5.0000 mg | ORAL_TABLET | ORAL | 0 refills | Status: DC | PRN

## 2023-10-10 MED ORDER — CHLORHEXIDINE GLUCONATE 0.12 % MT SOLN
15.0000 mL | Freq: Once | OROMUCOSAL | Status: AC
  Administered 2023-10-10: 15 mL via OROMUCOSAL

## 2023-10-10 MED ORDER — PHENYLEPHRINE HCL-NACL 20-0.9 MG/250ML-% IV SOLN
INTRAVENOUS | Status: DC | PRN
  Administered 2023-10-10: 40 ug/min via INTRAVENOUS

## 2023-10-10 MED ORDER — PROPOFOL 10 MG/ML IV BOLUS
INTRAVENOUS | Status: AC
  Filled 2023-10-10: qty 20

## 2023-10-10 MED ORDER — DEXMEDETOMIDINE HCL IN NACL 80 MCG/20ML IV SOLN
INTRAVENOUS | Status: AC
Start: 2023-10-10 — End: 2023-10-10
  Filled 2023-10-10: qty 20

## 2023-10-10 MED ORDER — DROPERIDOL 2.5 MG/ML IJ SOLN: 0.6250 mg | Freq: Once | INTRAMUSCULAR | Status: DC | PRN

## 2023-10-10 MED ORDER — ONDANSETRON HCL 4 MG/2ML IJ SOLN
INTRAMUSCULAR | Status: DC | PRN
  Administered 2023-10-10: 4 mg via INTRAVENOUS

## 2023-10-10 MED ORDER — PHENYLEPHRINE 80 MCG/ML (10ML) SYRINGE FOR IV PUSH (FOR BLOOD PRESSURE SUPPORT)
PREFILLED_SYRINGE | INTRAVENOUS | Status: DC | PRN
  Administered 2023-10-10: 80 ug via INTRAVENOUS
  Administered 2023-10-10: 160 ug via INTRAVENOUS

## 2023-10-10 MED ORDER — LACTATED RINGERS IV SOLN: INTRAVENOUS | Status: DC

## 2023-10-10 MED ORDER — TIZANIDINE HCL 4 MG PO TABS: 4.0000 mg | ORAL_TABLET | Freq: Three times a day (TID) | ORAL | 0 refills | Status: DC | PRN

## 2023-10-10 MED ORDER — LIDOCAINE HCL (PF) 2 % IJ SOLN
INTRAMUSCULAR | Status: AC
  Filled 2023-10-10: qty 5

## 2023-10-10 MED ORDER — BUPIVACAINE LIPOSOME 1.3 % IJ SUSP
INTRAMUSCULAR | Status: DC | PRN
  Administered 2023-10-10: 10 mL

## 2023-10-10 MED ORDER — FENTANYL CITRATE (PF) 100 MCG/2ML IJ SOLN
INTRAMUSCULAR | Status: DC | PRN
  Administered 2023-10-10 (×2): 50 ug via INTRAVENOUS

## 2023-10-10 MED ORDER — ORAL CARE MOUTH RINSE: 15.0000 mL | Freq: Once | OROMUCOSAL | Status: AC

## 2023-10-10 MED ORDER — PHENYLEPHRINE HCL-NACL 20-0.9 MG/250ML-% IV SOLN
INTRAVENOUS | Status: DC | PRN
Start: 2023-10-10 — End: 2023-10-10

## 2023-10-10 MED ORDER — OXYCODONE HCL 5 MG/5ML PO SOLN: 5.0000 mg | Freq: Once | ORAL | Status: DC | PRN

## 2023-10-10 MED ORDER — LIDOCAINE HCL (PF) 2 % IJ SOLN
INTRAMUSCULAR | Status: DC | PRN
  Administered 2023-10-10: 60 mg via INTRADERMAL
  Administered 2023-10-10: 100 mg via INTRADERMAL

## 2023-10-10 MED ORDER — BUPIVACAINE-EPINEPHRINE (PF) 0.5% -1:200000 IJ SOLN
INTRAMUSCULAR | Status: DC | PRN
  Administered 2023-10-10: 10 mL

## 2023-10-10 MED ORDER — SUGAMMADEX SODIUM 200 MG/2ML IV SOLN
INTRAVENOUS | Status: AC
  Filled 2023-10-10: qty 2

## 2023-10-10 MED ORDER — ONDANSETRON HCL 4 MG/2ML IJ SOLN: 4.0000 mg | Freq: Once | INTRAMUSCULAR | Status: DC | PRN

## 2023-10-10 MED ORDER — EPHEDRINE SULFATE-NACL 50-0.9 MG/10ML-% IV SOSY
PREFILLED_SYRINGE | INTRAVENOUS | Status: DC | PRN
  Administered 2023-10-10: 10 mg via INTRAVENOUS
  Administered 2023-10-10: 5 mg via INTRAVENOUS

## 2023-10-10 MED ORDER — PROPOFOL 10 MG/ML IV BOLUS
INTRAVENOUS | Status: DC | PRN
  Administered 2023-10-10: 20 mg via INTRAVENOUS

## 2023-10-10 MED ORDER — FENTANYL CITRATE PF 50 MCG/ML IJ SOSY: 25.0000 ug | PREFILLED_SYRINGE | INTRAMUSCULAR | Status: DC | PRN

## 2023-10-10 MED ORDER — CEFAZOLIN SODIUM-DEXTROSE 2-4 GM/100ML-% IV SOLN
2.0000 g | INTRAVENOUS | Status: AC
Start: 2023-10-10 — End: 2023-10-10
  Administered 2023-10-10: 2 g via INTRAVENOUS
  Filled 2023-10-10: qty 100

## 2023-10-10 MED ORDER — ROCURONIUM BROMIDE 10 MG/ML (PF) SYRINGE
PREFILLED_SYRINGE | INTRAVENOUS | Status: DC | PRN
  Administered 2023-10-10: 60 mg via INTRAVENOUS

## 2023-10-10 MED ORDER — FENTANYL CITRATE (PF) 100 MCG/2ML IJ SOLN
INTRAMUSCULAR | Status: AC
  Filled 2023-10-10: qty 2

## 2023-10-10 MED ORDER — POVIDONE-IODINE 7.5 % EX SOLN: Freq: Once | CUTANEOUS | Status: DC

## 2023-10-10 MED ORDER — DEXMEDETOMIDINE HCL IN NACL 80 MCG/20ML IV SOLN
INTRAVENOUS | Status: DC | PRN
  Administered 2023-10-10: 8 ug via INTRAVENOUS

## 2023-10-10 MED ORDER — MIDAZOLAM HCL 2 MG/2ML IJ SOLN
INTRAMUSCULAR | Status: AC
  Filled 2023-10-10: qty 2

## 2023-10-10 MED ORDER — DEXAMETHASONE SODIUM PHOSPHATE 10 MG/ML IJ SOLN
INTRAMUSCULAR | Status: AC
Start: 2023-10-10 — End: 2023-10-10
  Filled 2023-10-10: qty 1

## 2023-10-10 MED ORDER — ROCURONIUM BROMIDE 10 MG/ML (PF) SYRINGE
PREFILLED_SYRINGE | INTRAVENOUS | Status: AC
Start: 2023-10-10 — End: 2023-10-10
  Filled 2023-10-10: qty 10

## 2023-10-10 MED ORDER — ONDANSETRON HCL 4 MG/2ML IJ SOLN
INTRAMUSCULAR | Status: AC
  Filled 2023-10-10: qty 2

## 2023-10-10 SURGICAL SUPPLY — 48 items
BAG COUNTER SPONGE SURGICOUNT (BAG) IMPLANT
BOOTIES KNEE HIGH SLOAN (MISCELLANEOUS) ×2 IMPLANT
BURR OVAL 8 FLU 4.0X13 (MISCELLANEOUS) ×1 IMPLANT
CANNULA 5.75X7 CRYSTAL CLEAR (CANNULA) ×1 IMPLANT
CANNULA TWIST IN 8.25X7CM (CANNULA) IMPLANT
COVER SURGICAL LIGHT HANDLE (MISCELLANEOUS) IMPLANT
CUTTER BONE 4.0MM X 13CM (MISCELLANEOUS) ×1 IMPLANT
DISSECTOR 3.8MM X 13CM (MISCELLANEOUS) IMPLANT
DRAPE FOOT SWITCH (DRAPES) ×2 IMPLANT
DRAPE IMP U-DRAPE 54X76 (DRAPES) ×1 IMPLANT
DRAPE INCISE IOBAN 66X45 STRL (DRAPES) IMPLANT
DRAPE STERI 35X30 U-POUCH (DRAPES) ×1 IMPLANT
DRAPE SURG 17X23 STRL (DRAPES) ×1 IMPLANT
DRAPE SURG ORHT 6 SPLT 77X108 (DRAPES) ×2 IMPLANT
DRAPE TOP 10253 STERILE (DRAPES) ×1 IMPLANT
DRAPE U-SHAPE 47X51 STRL (DRAPES) ×1 IMPLANT
DRSG XEROFORM 1X8 (GAUZE/BANDAGES/DRESSINGS) IMPLANT
DURAPREP 26ML APPLICATOR (WOUND CARE) ×1 IMPLANT
ELECT REM PT RETURN 15FT ADLT (MISCELLANEOUS) IMPLANT
GAUZE PAD ABD 8X10 STRL (GAUZE/BANDAGES/DRESSINGS) ×2 IMPLANT
GAUZE SPONGE 4X4 12PLY STRL (GAUZE/BANDAGES/DRESSINGS) ×1 IMPLANT
GAUZE XEROFORM 1X8 LF (GAUZE/BANDAGES/DRESSINGS) ×1 IMPLANT
GLOVE BIO SURGEON STRL SZ7.5 (GLOVE) ×1 IMPLANT
GLOVE BIOGEL PI IND STRL 6.5 (GLOVE) ×1 IMPLANT
GLOVE BIOGEL PI IND STRL 8 (GLOVE) ×1 IMPLANT
GLOVE SURG SS PI 6.5 STRL IVOR (GLOVE) ×1 IMPLANT
GOWN STRL REUS W/ TWL LRG LVL3 (GOWN DISPOSABLE) ×1 IMPLANT
GOWN STRL REUS W/ TWL XL LVL3 (GOWN DISPOSABLE) ×1 IMPLANT
KIT BASIN OR (CUSTOM PROCEDURE TRAY) ×1 IMPLANT
KIT TURNOVER KIT A (KITS) ×1 IMPLANT
LASSO CRESCENT QUICKPASS (SUTURE) IMPLANT
MANIFOLD NEPTUNE II (INSTRUMENTS) ×1 IMPLANT
NDL HD SCORPION MEGA LOADER (NEEDLE) IMPLANT
PACK ARTHROSCOPY WL (CUSTOM PROCEDURE TRAY) ×1 IMPLANT
PROTECTOR NERVE ULNAR (MISCELLANEOUS) ×1 IMPLANT
RESTRAINT HEAD UNIVERSAL NS (MISCELLANEOUS) ×1 IMPLANT
SLING ARM FOAM STRAP LRG (SOFTGOODS) IMPLANT
SLING ARM FOAM STRAP MED (SOFTGOODS) IMPLANT
SUPPORT WRAP ARM LG (MISCELLANEOUS) IMPLANT
SUT ETHILON 3 0 PS 1 (SUTURE) ×1 IMPLANT
SUT PDS AB 1 CT1 27 (SUTURE) IMPLANT
SUT TIGER TAPE 7 IN WHITE (SUTURE) IMPLANT
SUT VIC AB 0 CT1 36 (SUTURE) IMPLANT
SUTURE TAPE 1.3 40 TPR END (SUTURE) IMPLANT
TAPE FIBER 2MM 7IN #2 BLUE (SUTURE) IMPLANT
TOWEL OR 17X26 10 PK STRL BLUE (TOWEL DISPOSABLE) ×1 IMPLANT
TUBING ARTHROSCOPY IRRIG 16FT (MISCELLANEOUS) ×1 IMPLANT
WAND ABLATOR APOLLO I90 (BUR) ×1 IMPLANT

## 2023-10-10 NOTE — Transfer of Care (Signed)
 Immediate Anesthesia Transfer of Care Note  Patient: Caleb House  Procedure(s) Performed: ARTHROSCOPY, SHOULDER, WITH ROTATOR CUFF REPAIR (Right) ARTHROSCOPY, SHOULDER WITH DEBRIDEMENT (Right) DECOMPRESSION, SUBACROMIAL SPACE (Right)  Patient Location: PACU  Anesthesia Type:General  Level of Consciousness: awake, alert , oriented, and patient cooperative  Airway & Oxygen Therapy: Patient Spontanous Breathing  Post-op Assessment: Report given to RN and Post -op Vital signs reviewed and stable  Post vital signs: Reviewed and stable  Last Vitals:  Vitals Value Taken Time  BP 146/82 10/10/23 08:30  Temp    Pulse 66 10/10/23 08:32  Resp 18 10/10/23 08:32  SpO2 95 % 10/10/23 08:32  Vitals shown include unfiled device data.  Last Pain:  Vitals:   10/10/23 0642  TempSrc:   PainSc: 4          Complications: No notable events documented.

## 2023-10-10 NOTE — Op Note (Signed)
 Procedure(s): ARTHROSCOPY, SHOULDER, WITH ROTATOR CUFF REPAIR ARTHROSCOPY, SHOULDER WITH DEBRIDEMENT DECOMPRESSION, SUBACROMIAL SPACE Procedure Note  Caleb House male 73 y.o. 10/10/2023  Preoperative diagnosis: 1 right shoulder partial rotator cuff tear 2 right shoulder impingement with unfavorable acromial anatomy 3 right shoulder symptomatic AC joint DJD  Postoperative diagnosis: Same  Procedure(s) and Anesthesia Type:    * ARTHROSCOPY, SHOULDER, WITH ROTATOR CUFF REPAIR - General    * ARTHROSCOPY, SHOULDER WITH DEBRIDEMENT - General    * DECOMPRESSION, SUBACROMIAL SPACE - General  Surgeons and Role:    DEWAINE Dozier Soulier, MD - Primary     Surgeon: Josefa LELON Dozier   Assistants: Jeoffrey Northern PA-C [name] was present and scrubbed throughout the procedure and was essential in positioning, assisting with the camera and instrumentation,, and closure)  Anesthesia: General endotracheal anesthesia with preoperative interscalene block given by the attending anesthesiologist      Procedure Detail  ARTHROSCOPY, SHOULDER, WITH ROTATOR CUFF REPAIR, ARTHROSCOPY, SHOULDER WITH DEBRIDEMENT, DECOMPRESSION, SUBACROMIAL SPACE  Estimated Blood Loss: Min         Drains: none  Blood Given: none         Specimens: none        Complications:  * No complications entered in OR log *         Disposition: PACU - hemodynamically stable.         Condition: stable    Procedure:   INDICATIONS FOR SURGERY: The patient is 73 y.o. male who has had a long history of right shoulder pain which has been refractory to nonoperative management with PT, injection therapy and rest.  Surgical treatment is indicated to decrease pain and improve function.  OPERATIVE FINDINGS: Examination under anesthesia demonstrated moderate stiffness with satisfactory improvement with gentle manipulation under anesthesia.  DESCRIPTION OF PROCEDURE: The patient was identified in preoperative  holding area  where I personally marked the operative site after  verifying site, side, and procedure with the patient. An interscalene block was given by the attending anesthesiologist the holding area.  The patient was taken back to the operating room where general anesthesia was induced without complication and was placed in the beach-chair position with the back  elevated about 60 degrees and all extremities and head and neck carefully padded and  positioned.   The right upper extremity was then prepped and  draped in a standard sterile fashion. The appropriate time-out  procedure was carried out. The patient did receive IV antibiotics  within 30 minutes of incision.   A small posterior portal incision was made and the arthroscope was introduced into the joint. An anterior portal was then established above the subscapularis using needle localization. Small cannula was placed anteriorly. Diagnostic arthroscopy was then carried out.  He was noted to have partial tearing of the superior anterior labrum.  This was debrided back to healthy labrum.  The biceps tendon was pulled into the joint and noted to be intact with mild synovitis.  The undersurface of the rotator cuff was examined and noted to be intact.  Glenohumeral joint surfaces were intact.  The arthroscope was then introduced into the subacromial space a standard lateral portal was established with needle localization. The shaver was used through the lateral portal to perform extensive bursectomy. Coracoacromial ligament was examined and found to be frayed indicating chronic impingement.  The bursal side of the rotator cuff was noted to have deep partial tearing in the central aspect of the supraspinatus longitudinally.  No lateral detachment.  There  is no exposed tuberosity.  Partial tear was debrided back to healthy tendon fibers.  No repair was necessary.  The coracoacromial ligament was taken down off the anterior acromion with the ArthroCare  exposing a large anterior acromial spur. A high-speed bur was then used through the lateral portal to take down the anterior acromial spur from lateral to medial in a standard acromioplasty.  The acromioplasty was also viewed from the lateral portal and the bur was used as necessary to ensure that the acromion was completely flat from posterior to anterior.  The distal clavicle was exposed arthroscopically and the bur was used to take off the undersurface for approximately 8 mm from the lateral portal. The bur was then moved to an anterior portal position to complete the distal clavicle excision resecting about 8 mm of the distal clavicle and a smooth even fashion. This was viewed from anterior and lateral portals and felt to be complete.  The arthroscopic equipment was removed from the joint and the portals were closed with 3-0 nylon in an interrupted fashion. Sterile dressings were then applied including Xeroform 4 x 4's ABDs and tape. The patient was then allowed to awaken from general anesthesia, placed in a sling, transferred to the stretcher and taken to the recovery room in stable condition.   POSTOPERATIVE PLAN: The patient will be discharged home today and will followup in one week for suture removal and wound check.

## 2023-10-10 NOTE — Anesthesia Procedure Notes (Signed)
 Anesthesia Regional Block: Supraclavicular block   Pre-Anesthetic Checklist: , timeout performed,  Correct Patient, Correct Site, Correct Laterality,  Correct Procedure, Correct Position, site marked,  Risks and benefits discussed,  At surgeon's request and post-op pain management  Laterality: Upper and Right  Prep: chloraprep       Needles:  Injection technique: Single-shot  Needle Type: Stimulator Needle - 40      Needle Gauge: 22     Additional Needles:   Procedures:, nerve stimulator,,, ultrasound used (permanent image in chart),,     Nerve Stimulator or Paresthesia:  Response: Twitch elicited, 0.5 mA, 0.3 ms  Additional Responses:   Narrative:  Start time: 10/10/2023 7:10 AM End time: 10/10/2023 7:14 AM  Performed by: With CRNAs  Anesthesiologist: Waddell Lauraine NOVAK, MD  Additional Notes: Block assessed prior to start of surgery

## 2023-10-10 NOTE — Anesthesia Procedure Notes (Signed)
 Procedure Name: Intubation Date/Time: 10/10/2023 7:31 AM  Performed by: Franchot Delon RAMAN, CRNAPre-anesthesia Checklist: Patient identified, Emergency Drugs available, Suction available and Patient being monitored Patient Re-evaluated:Patient Re-evaluated prior to induction Oxygen Delivery Method: Circle System Utilized Preoxygenation: Pre-oxygenation with 100% oxygen Induction Type: IV induction Ventilation: Mask ventilation without difficulty Laryngoscope Size: Miller and 2 Grade View: Grade I Tube type: Oral Tube size: 7.5 mm Number of attempts: 1 Airway Equipment and Method: Stylet Placement Confirmation: ETT inserted through vocal cords under direct vision, positive ETCO2 and breath sounds checked- equal and bilateral Secured at: 22 cm Tube secured with: Tape Dental Injury: Teeth and Oropharynx as per pre-operative assessment

## 2023-10-10 NOTE — H&P (Signed)
 Caleb House is an 73 y.o. male.   Chief Complaint: R shoulder pain  HPI: R shoulder pain with partial RCT, impingement and AC arthritis.  Failed conservative management.  Past Medical History:  Diagnosis Date   Arthritis    CAD (coronary artery disease)    CHF (congestive heart failure) (HCC)    Chronic kidney disease    COPD (chronic obstructive pulmonary disease) (HCC)    GERD (gastroesophageal reflux disease)    HTN (hypertension)    Hyperlipidemia     Past Surgical History:  Procedure Laterality Date   CARDIAC CATHETERIZATION     7 stents placed   cataracts  Bilateral 01/2022   COLONOSCOPY WITH PROPOFOL  N/A 10/06/2020   Procedure: COLONOSCOPY WITH PROPOFOL ;  Surgeon: Therisa Bi, MD;  Location: [facility] ENDOSCOPY;  Service: Gastroenterology;  Laterality: N/A;   KNEE SURGERY     Right knee   TONSILLECTOMY      Family History  Problem Relation Age of Onset   Depression Mother    Alcohol abuse Mother    Suicidality Mother    Heart disease Father        No details   Heart disease Brother        No details   Suicidality Maternal Grandfather    Heart attack Paternal Grandfather 47   Social History:  reports that he quit smoking about 5 years ago. His smoking use included cigarettes. He started smoking about 45 years ago. He has a 30 pack-year smoking history. He has never used smokeless tobacco. He reports that he does not currently use alcohol. He reports that he does not use drugs.  Allergies:  Allergies  Allergen Reactions   Brilinta [Ticagrelor] Other (See Comments)    Pt states difficulty breathing   Entresto  [Sacubitril -Valsartan ] Other (See Comments)    hyperkalemia   Isosorbide  Mononitrate [Isosorbide  Nitrate] Other (See Comments)    headache   Spironolactone      Hyperkalemia  Pt states he does not remember.    Medications Prior to Admission  Medication Sig Dispense Refill   acetaminophen  (TYLENOL ) 500 MG tablet Take 500 mg by mouth every 6 (six) hours  as needed (pain.).     aspirin EC 81 MG tablet Take 81 mg by mouth in the morning. Swallow whole.     carboxymethylcellulose (REFRESH TEARS) 0.5 % SOLN Place 1-2 drops into both eyes 3 (three) times daily as needed (watery/irritated eyes.).     carvedilol  (COREG ) 6.25 MG tablet TAKE 1 TABLET BY MOUTH TWICE A DAY 180 tablet 2   clopidogrel  (PLAVIX ) 75 MG tablet TAKE 1 TABLET BY MOUTH EVERY DAY 90 tablet 1   Fluticasone-Umeclidin-Vilant (TRELEGY ELLIPTA ) 100-62.5-25 MCG/ACT AEPB Inhale 1 puff into the lungs daily. 1 each 6   hydrocortisone cream 1 % Apply 1 Application topically 2 (two) times daily as needed for itching (irritation.).     Multiple Vitamins-Minerals (PRESERVISION AREDS 2 PO) Take 1 tablet by mouth in the morning and at bedtime.     pantoprazole  (PROTONIX ) 20 MG tablet Take 1 tablet (20 mg total) by mouth daily. (Patient taking differently: Take 20 mg by mouth daily as needed for heartburn or indigestion.) 30 tablet 2   rosuvastatin  (CRESTOR ) 40 MG tablet TAKE 1 TABLET BY MOUTH EVERY DAY (Patient taking differently: Take 40 mg by mouth at bedtime.) 90 tablet 3   tamsulosin  (FLOMAX ) 0.4 MG CAPS capsule Take 1 capsule (0.4 mg total) by mouth daily after supper. 30 capsule 11   albuterol  (VENTOLIN   HFA) 108 (90 Base) MCG/ACT inhaler INHALE 1-2 PUFFS BY MOUTH EVERY 6 HOURS AS NEEDED FOR WHEEZE OR SHORTNESS OF BREATH 8.5 each 2    No results found for this or any previous visit (from the past 48 hours). No results found.  Review of Systems  All other systems reviewed and are negative.   Blood pressure (!) 163/86, pulse (!) 55, temperature (!) 97.5 F (36.4 C), temperature source Oral, resp. rate 16, SpO2 99%. Physical Exam Constitutional:      Appearance: He is well-developed.  HENT:     Head: Atraumatic.  Eyes:     Extraocular Movements: Extraocular movements intact.  Cardiovascular:     Pulses: Normal pulses.  Pulmonary:     Effort: Pulmonary effort is normal.   Musculoskeletal:     Comments: R shoulder pain with impingement tests, TTP at [facility] joint.  Skin:    General: Skin is warm and dry.  Neurological:     Mental Status: He is alert and oriented to person, place, and time.  Psychiatric:        Mood and Affect: Mood normal.      Assessment/Plan R shoulder pain with partial RCT, impingement and AC arthritis.  Failed conservative management. Plan R arth RC debridement, SAD, DCE Risks / benefits of surgery discussed Consent on chart  NPO for OR Preop antibiotics   Josefa LELON Herring, MD 10/10/2023, 6:12 AM

## 2023-10-10 NOTE — Anesthesia Postprocedure Evaluation (Signed)
 Anesthesia Post Note  Patient: Rithik Odea  Procedure(s) Performed: ARTHROSCOPY, SHOULDER, WITH ROTATOR CUFF REPAIR (Right) ARTHROSCOPY, SHOULDER WITH DEBRIDEMENT (Right) DECOMPRESSION, SUBACROMIAL SPACE (Right)     Patient location during evaluation: Phase II Anesthesia Type: Regional and General Level of consciousness: awake and alert Pain management: pain level controlled Vital Signs Assessment: post-procedure vital signs reviewed and stable Respiratory status: spontaneous breathing and nonlabored ventilation Cardiovascular status: stable Postop Assessment: able to ambulate and adequate PO intake Anesthetic complications: no Comments: Patient assessed at bedside. Pain well controlled with peripheral nerve block. No nausea. Tolerating PO intake. Stable for discharge.    No notable events documented.  Last Vitals:  Vitals:   10/10/23 0913 10/10/23 0930  BP: 131/87 (!) 135/98  Pulse: 63 64  Resp: 12   Temp: (!) 36.4 C   SpO2: 97% 98%    Last Pain:  Vitals:   10/10/23 0913  TempSrc: Oral  PainSc: 0-No pain                 Lauraine KATHEE [place]

## 2023-10-10 NOTE — Discharge Instructions (Signed)
 Discharge Instructions after Arthroscopic Shoulder Surgery   A sling has been provided for you. You may remove the sling after 72 hours. The sling may be worn for your protection, if you are in a crowd.  Use ice on the shoulder intermittently over the first 48 hours after surgery.  Pain medication has been prescribed for you.  Use your medication liberally over the first 48 hours, and then begin to taper your use. You may take Extra Strength Tylenol  or Tylenol  only in place of the pain pills. DO NOT take ANY nonsteroidal anti-inflammatory pain medications: Advil, Motrin, Ibuprofen, Aleve, Naproxen, or Naprosyn.  You may remove your dressing after two days.  You may shower 5 days after surgery. The incision CANNOT get wet prior to 5 days. Simply allow the water to wash over the site and then pat dry. Do not rub the incision. Make sure your axilla (armpit) is completely dry after showering.  Resume aspirin and Plavix  the day after surgery Three to 5 times each day you should perform assisted overhead reaching and external rotation (outward turning) exercises with the operative arm. Both exercises should be done with the non-operative arm used as the therapist arm while the operative arm remains relaxed. Ten of each exercise should be done three to five times each day.    Overhead reach is helping to lift your stiff arm up as high as it will go. To stretch your overhead reach, lie flat on your back, relax, and grasp the wrist of the tight shoulder with your opposite hand. Using the power in your opposite arm, bring the stiff arm up as far as it is comfortable. Start holding it for ten seconds and then work up to where you can hold it for a count of 30. Breathe slowly and deeply while the arm is moved. Repeat this stretch ten times, trying to help the arm up a little higher each time.       External rotation is turning the arm out to the side while your elbow stays close to your body. External  rotation is best stretched while you are lying on your back. Hold a cane, yardstick, broom handle, or dowel in both hands. Bend both elbows to a right angle. Use steady, gentle force from your normal arm to rotate the hand of the stiff shoulder out away from your body. Continue the rotation as far as it will go comfortably, holding it there for a count of 10. Repeat this exercise ten times.     Please call [phone] during normal business hours or [phone] after hours for any problems. Including the following:  - excessive redness of the incisions - drainage for more than 4 days - fever of more than 101.5 F  *Please note that pain medications will not be refilled after hours or on weekends.

## 2023-10-11 ENCOUNTER — Encounter (HOSPITAL_COMMUNITY): Payer: Self-pay | Admitting: Orthopedic Surgery

## 2023-10-16 ENCOUNTER — Other Ambulatory Visit: Payer: Self-pay | Admitting: Cardiology

## 2023-11-05 ENCOUNTER — Encounter: Payer: Self-pay | Admitting: Acute Care

## 2023-11-05 ENCOUNTER — Ambulatory Visit: Admitting: Acute Care

## 2023-11-05 VITALS — BP 144/75 | HR 60 | Temp 97.5°F | Ht 70.5 in | Wt 184.4 lb

## 2023-11-05 DIAGNOSIS — J449 Chronic obstructive pulmonary disease, unspecified: Secondary | ICD-10-CM | POA: Diagnosis not present

## 2023-11-05 DIAGNOSIS — Z87891 Personal history of nicotine dependence: Secondary | ICD-10-CM | POA: Diagnosis not present

## 2023-11-05 MED ORDER — TRELEGY ELLIPTA 100-62.5-25 MCG/ACT IN AEPB: 1.0000 | INHALATION_SPRAY | Freq: Every day | RESPIRATORY_TRACT | 6 refills | Status: AC

## 2023-11-05 NOTE — Patient Instructions (Addendum)
 It is good to see you today. I am glad you have been doing well.  Continue Trelegy 1 puff once daily. Rinse mouth after use. Use albuterol  as needed for breakthrough shortness of breath . Next lung cancer screening 11/2023. We will call you with results. I have sent in additional refills on your Trelegy. Note your daily symptoms > remember red flags for COPD:  Increase in cough, increase in sputum production, increase in shortness of breath or activity intolerance. If you notice these symptoms, please call to be seen.    Follow up 6 months with Lauraine NP or Dr. Annella.  But call if you need us  sooner.  Please contact office for sooner follow up if symptoms do not improve or worsen or seek emergency care

## 2023-11-05 NOTE — Progress Notes (Signed)
 History of Present Illness Caleb House is a 73 y.o. male former smoker with severe COPD with worsening dyspnea.He is followed by Lauraine Lites NP and Dr. Annella .   11/05/2023 Discussed the use of AI scribe software for clinical note transcription with the patient, who gave verbal consent to proceed.  History of Present Illness Caleb House is a 73 year old male who presents for a routine follow-up of his COPD. He has had a stable interval. No flare or issues with breathing.   He has not experienced any respiratory flares and has not needed to use his albuterol  inhaler at all. He has three bottles of albuterol  at home and has stopped picking up new prescriptions as he is not using them. He is currently using Trelegy, one puff daily, and has six refills remaining. He previously preferred Breztri  but was unable to continue due to insurance coverage issues, as it was too expensive out-of-pocket co pay. His insurance prefers Trelegy despite patient having better response to Breztri . This is very frustrating for him. .  He has a lung cancer screening scan scheduled for November. No increase in coughing or changes in sputum production, noting that any phlegm he experiences is likely related to allergies and is not constant.It does not affect his breathing long term.  He lives in [place] and prefers to continue his care at the [place] location rather than being referred to [place]. We will plan on a 6 month follow up, as he knows he can call at any time to be seen as an acute if he needs us .      Test Results: PFT's done 08/02/2023     Latest Ref Rng & Units 09/26/2023    9:22 AM 08/12/2023    3:40 PM 09/25/2022    9:57 AM  CBC  WBC 4.0 - 10.5 K/uL 5.1  5.5  12.2   Hemoglobin 13.0 - 17.0 g/dL 85.9  86.0  89.0   Hematocrit 39.0 - 52.0 % 43.6  43.2  35.0   Platelets 150.0 - 400.0 K/uL 192.0  179.0  317.0        Latest Ref Rng & Units 09/26/2023    9:22 AM 08/12/2023    3:40 PM  09/25/2022    9:57 AM  BMP  Glucose 70 - 99 mg/dL 895  895  870   BUN 6 - 23 mg/dL 29  32  24   Creatinine 0.40 - 1.50 mg/dL 8.25  8.14  8.41   Sodium 135 - 145 mEq/L 140  142  136   Potassium 3.5 - 5.1 mEq/L 4.3  4.4  4.7   Chloride 96 - 112 mEq/L 108  108  104   CO2 19 - 32 mEq/L 25  27  24    Calcium  8.4 - 10.5 mg/dL 8.9  9.2  8.8     BNP    Component Value Date/Time   BNP 60.4 07/23/2022 1216    ProBNP No results found for: PROBNP  PFT    Component Value Date/Time   FEV1PRE 1.34 08/02/2023 0840   FEV1POST 1.44 08/02/2023 0840   FVCPRE 2.69 08/02/2023 0840   FVCPOST 2.99 08/02/2023 0840   TLC 8.46 08/02/2023 0840   DLCOUNC 20.01 08/02/2023 0840   PREFEV1FVCRT 50 08/02/2023 0840   PSTFEV1FVCRT 48 08/02/2023 0840    No results found.   Past medical hx Past Medical History:  Diagnosis Date   Arthritis    CAD (coronary artery disease)    CHF (  congestive heart failure) (HCC)    Chronic kidney disease    COPD (chronic obstructive pulmonary disease) (HCC)    GERD (gastroesophageal reflux disease)    HTN (hypertension)    Hyperlipidemia      Social History   Tobacco Use   Smoking status: Former    Current packs/day: 0.00    Average packs/day: 0.8 packs/day for 40.0 years (30.0 ttl pk-yrs)    Types: Cigarettes    Start date: 08/23/1978    Quit date: 08/23/2018    Years since quitting: 5.2   Smokeless tobacco: Never  Vaping Use   Vaping status: Never Used  Substance Use Topics   Alcohol use: Not Currently    Comment: quit drinking 9 years ago   Drug use: Never    Mr.Odowd reports that he quit smoking about 5 years ago. His smoking use included cigarettes. He started smoking about 45 years ago. He has a 30 pack-year smoking history. He has never used smokeless tobacco. He reports that he does not currently use alcohol. He reports that he does not use drugs.  Tobacco Cessation: Counseling given: Not Answered Former smoker , Quit 2020 with a 30 pack year  smoking history. Patient is also followed through the lung cancer screening program.   Past surgical hx, Family hx, Social hx all reviewed.  Current Outpatient Medications on File Prior to Visit  Medication Sig   acetaminophen  (TYLENOL ) 500 MG tablet Take 500 mg by mouth every 6 (six) hours as needed (pain.).   albuterol  (VENTOLIN  HFA) 108 (90 Base) MCG/ACT inhaler INHALE 1-2 PUFFS BY MOUTH EVERY 6 HOURS AS NEEDED FOR WHEEZE OR SHORTNESS OF BREATH   aspirin EC 81 MG tablet Take 81 mg by mouth in the morning. Swallow whole.   carboxymethylcellulose (REFRESH TEARS) 0.5 % SOLN Place 1-2 drops into both eyes 3 (three) times daily as needed (watery/irritated eyes.).   carvedilol  (COREG ) 6.25 MG tablet TAKE 1 TABLET BY MOUTH TWICE A DAY   clopidogrel  (PLAVIX ) 75 MG tablet TAKE 1 TABLET BY MOUTH EVERY DAY   Fluticasone-Umeclidin-Vilant (TRELEGY ELLIPTA ) 100-62.5-25 MCG/ACT AEPB Inhale 1 puff into the lungs daily.   hydrocortisone cream 1 % Apply 1 Application topically 2 (two) times daily as needed for itching (irritation.).   Multiple Vitamins-Minerals (PRESERVISION AREDS 2 PO) Take 1 tablet by mouth in the morning and at bedtime.   pantoprazole  (PROTONIX ) 20 MG tablet Take 1 tablet (20 mg total) by mouth daily. (Patient taking differently: Take 20 mg by mouth daily as needed for heartburn or indigestion.)   rosuvastatin  (CRESTOR ) 40 MG tablet TAKE 1 TABLET BY MOUTH EVERY DAY   tamsulosin  (FLOMAX ) 0.4 MG CAPS capsule Take 1 capsule (0.4 mg total) by mouth daily after supper.   No current facility-administered medications on file prior to visit.     Allergies  Allergen Reactions   Brilinta [Ticagrelor] Other (See Comments)    Pt states difficulty breathing   Entresto  [Sacubitril -Valsartan ] Other (See Comments)    hyperkalemia   Isosorbide  Mononitrate [Isosorbide  Nitrate] Other (See Comments)    headache   Spironolactone      Hyperkalemia  Pt states he does not remember.    Review Of  Systems:  Constitutional:   No  weight loss, night sweats,  Fevers, chills, fatigue, or  lassitude.  HEENT:   No headaches,  Difficulty swallowing,  Tooth/dental problems, or  Sore throat,                No sneezing, itching,  ear ache, nasal congestion, post nasal drip,   CV:  No chest pain,  Orthopnea, PND, swelling in lower extremities, anasarca, dizziness, palpitations, syncope.   GI  No heartburn, indigestion, abdominal pain, nausea, vomiting, diarrhea, change in bowel habits, loss of appetite, bloody stools.   Resp: No shortness of breath with exertion or at rest.  No excess mucus, no productive cough,  No non-productive cough,  No coughing up of blood.  No change in color of mucus.  No wheezing.  No chest wall deformity  Skin: no rash or lesions.  GU: no dysuria, change in color of urine, no urgency or frequency.  No flank pain, no hematuria   MS:  No joint pain or swelling.  No decreased range of motion.  No back pain.  Psych:  No change in mood or affect. No depression or anxiety.  No memory loss.   Vital Signs BP (!) 144/75   Pulse 60   Temp (!) 97.5 F (36.4 C) (Oral)   Ht 5' 10.5 (1.791 m)   Wt 184 lb 6.4 oz (83.6 kg)   SpO2 97%   BMI 26.08 kg/m    Physical Exam:  General- No distress,  A&Ox3, pleasant ENT: No sinus tenderness, TM clear, pale nasal mucosa, no oral exudate,no post nasal drip, no LAN Cardiac: S1, S2, regular rate and rhythm, no murmur Chest: No wheeze/ rales/ dullness; no accessory muscle use, no nasal flaring, no sternal retractions, slightly diminihsed per bases Abd.: Soft Non-tender, ND, BS +, Body mass index is 26.08 kg/m.  Ext: No clubbing cyanosis, edema, no obvious deformities Neuro:  normal strength, MAE x 4, A&O x 3 appropriate Skin: No rashes, warm and dry, no obvious skin lesions Psych: normal mood and behavior  Assessment & Plan Chronic obstructive pulmonary disease (COPD) Well-controlled COPD with no recent exacerbations.  Albuterol  inhaler not used, indicating good symptom control.  Continues on Trelegy Ellipta , despite better breathing with Breztri , but insurance will not cover Breztri .. - Continue Trelegy Ellipta  one puff once daily. - Sent in  six refills for Trelegy Ellipta  to last until May of next year. - Use albuterol  inhaler as needed for emergency situations, make sure they are not expired, as you rarely use them. . - Advise to monitor for increased cough, discolored sputum, or fever and seek medical attention if these occur.  Lung cancer screening surveillance Participates in lung cancer screening program with next screening scheduled for November 2025. - Ensure lung cancer screening scan is completed in November. Follow up as LR protocol dictates.   I spent 20 minutes dedicated to the care of this patient on the date of this encounter to include pre-visit review of records, face-to-face time with the patient discussing conditions above, post visit ordering of testing, clinical documentation with the electronic health record, making appropriate referrals as documented, and communicating necessary information to the patient's healthcare team.      Lauraine JULIANNA Lites, NP 11/05/2023  8:44 AM

## 2023-11-27 ENCOUNTER — Other Ambulatory Visit: Payer: Self-pay

## 2023-11-29 DIAGNOSIS — N401 Enlarged prostate with lower urinary tract symptoms: Secondary | ICD-10-CM | POA: Insufficient documentation

## 2023-11-29 NOTE — Assessment & Plan Note (Deleted)
 Chronic BPH/LUTS On Flomax 

## 2023-11-29 NOTE — Progress Notes (Deleted)
   12/05/2023 7:27 AM   Caleb House 14-Aug-1950 [id number]  Reason for visit: Follow up BPH, PSA   HPI: 73 y.o. male, initial follow up with me today, previously seen by Dr. Penne in Nov 2024  Prior HPI: Hx of prior GH/clot retention in 2024 - Dr. Nieves Outpatient cystoscopies for [facility] in 2023, Nov 2024 - normal  Hx of elevated PSA  - PSA acutely elevated in 2024 due to GH/clot retention  - PSA 2.67 (Sept 2025) - returned to normal baseline  Hx of BPH  Hx of COPD, CADw/ stents, CHF, CKD4, emphsyema, carotid artery stenosis On aspirin Plavix    Physical Exam: There were no vitals taken for this visit.   Constitutional:  Alert and oriented, No acute distress.  Laboratory Data: Component Ref Range & Units (hover) 2 mo ago 1 yr ago 3 yr ago 4 yr ago  PSA 2.67 11.88 High  CM 5.67 High  CM 1.57 CM    Pertinent Imaging: N/A    Assessment & Plan:    Benign prostatic hyperplasia with nocturia  Gross hematuria Assessment & Plan: Hx of intermittent GH since 2023  - clot retention in 2024 Multiple negative diagnostic cystoscopies   Encounter for screening prostate specific antigen (PSA) measurement Assessment & Plan: Brief PSA elevation to ~12 last year due to GH/clot retention  PSA 2.67 (Sept 2025)   PSA has returned to normal baseline, well within age based thresholds.  Offered reassurance and reviewed this with him today.  He does not need further workup or continued routine lab draws  - Discontinue further routine PSA screenings   BPH with lower urinary tract symptoms without urinary obstruction Assessment & Plan: Chronic BPH/LUTS On Flomax          Penne JONELLE Skye, MD  [facility] [address] [place], [place] [address] [phone]

## 2023-11-29 NOTE — Assessment & Plan Note (Deleted)
 Brief PSA elevation to ~12 last year due to GH/clot retention  PSA 2.67 (Sept 2025)   PSA has returned to normal baseline, well within age based thresholds.  Offered reassurance and reviewed this with him today.  He does not need further workup or continued routine lab draws  - Discontinue further routine PSA screenings

## 2023-11-29 NOTE — Assessment & Plan Note (Deleted)
 Hx of intermittent GH since 2023  - clot retention in 2024 Multiple negative diagnostic cystoscopies

## 2023-12-04 ENCOUNTER — Ambulatory Visit: Payer: Self-pay | Admitting: Urology

## 2023-12-05 ENCOUNTER — Ambulatory Visit: Admitting: Urology

## 2023-12-05 DIAGNOSIS — R310 Gross hematuria: Secondary | ICD-10-CM

## 2023-12-05 DIAGNOSIS — Z125 Encounter for screening for malignant neoplasm of prostate: Secondary | ICD-10-CM

## 2023-12-05 DIAGNOSIS — N401 Enlarged prostate with lower urinary tract symptoms: Secondary | ICD-10-CM

## 2023-12-11 ENCOUNTER — Ambulatory Visit

## 2023-12-26 NOTE — Progress Notes (Signed)
   12/27/2023 10:19 AM   Caleb House September 29, 1950 [id number]  Reason for visit: Follow up BPH, PSA   HPI: 73 y.o. male, initial follow up with me today, previously seen by Dr. Penne in Nov 2024  Prior HPI: Hx of prior GH/clot retention in 2024 - Dr. Nieves Outpatient cystoscopies for [facility] in 2023, Nov 2024 - normal  Hx of elevated PSA  - PSA acutely elevated in 2024 due to GH/clot retention  - PSA 2.67 (Sept 2025) - returned to normal baseline  Hx of BPH  Hx of COPD, CADw/ x7 stents, CHF, CKD4, emphsyema, carotid artery stenosis On aspirin Plavix    Physical Exam: BP (!) 161/88   Pulse (!) 59   Ht 5' 10 (1.778 m)   Wt 184 lb 9.6 oz (83.7 kg)   BMI 26.49 kg/m    Constitutional:  Alert and oriented, No acute distress.  Laboratory Data: Component Ref Range & Units (hover) 2 mo ago 1 yr ago 3 yr ago 4 yr ago  PSA 2.67 11.88 High  CM 5.67 High  CM 1.57 CM    Pertinent Imaging: N/A    Assessment & Plan:    BPH with lower urinary tract symptoms without urinary obstruction Assessment & Plan: Chronic BPH/LUTS  - ~50-60g gland via CT estimation  - hx of GH requiring CBI  - on Flomax  monotherapy  PVR 5 cc today IPSS 6/2 Very happy with urinary habits, no interval changes.  No further episodes of gross hematuria  - Continue Flomax  monotherapy, expectant management - Follow-up in 1 year for symptom check, IPSS  Orders: -     Urinalysis, Complete -     Bladder Scan (Post Void Residual) in office  Gross hematuria Assessment & Plan: Hx of intermittent GH since 2023  - clot retention in 2024 Multiple negative diagnostic cystoscopies  No interval episodes of gross hematuria since last year Expectant management for now  Orders: -     Urinalysis, Complete -     Bladder Scan (Post Void Residual) in office  Elevated PSA Assessment & Plan: Brief PSA elevation to 12 last year during acute GH with clot  -PSA baseline closer to 2-5  -Last PSA 2.6 in September  2025  - Would generally recommend phasing out routine PSA screening at this age.  As he is in otherwise good health we may collect 1 more PSA next year, if stable-discontinue thereafter -PSA in 1 year         Penne JONELLE Skye, MD  [facility] [address], [address] [place], [place] [address] [phone]

## 2023-12-26 NOTE — Assessment & Plan Note (Addendum)
 Hx of intermittent GH since 2023  - clot retention in 2024 Multiple negative diagnostic cystoscopies  No interval episodes of gross hematuria since last year Expectant management for now

## 2023-12-26 NOTE — Assessment & Plan Note (Addendum)
 Chronic BPH/LUTS  - ~50-60g gland via CT estimation  - hx of GH requiring CBI  - on Flomax  monotherapy  PVR 5 cc today IPSS 6/2 Very happy with urinary habits, no interval changes.  No further episodes of gross hematuria  - Continue Flomax  monotherapy, expectant management - Follow-up in 1 year for symptom check, IPSS

## 2023-12-27 ENCOUNTER — Ambulatory Visit: Admission: RE | Admit: 2023-12-27 | Discharge: 2023-12-27 | Attending: Acute Care | Admitting: Acute Care

## 2023-12-27 ENCOUNTER — Encounter: Payer: Self-pay | Admitting: Urology

## 2023-12-27 ENCOUNTER — Ambulatory Visit: Admitting: Urology

## 2023-12-27 VITALS — BP 161/88 | HR 59 | Ht 70.0 in | Wt 184.6 lb

## 2023-12-27 DIAGNOSIS — N401 Enlarged prostate with lower urinary tract symptoms: Secondary | ICD-10-CM | POA: Diagnosis not present

## 2023-12-27 DIAGNOSIS — R310 Gross hematuria: Secondary | ICD-10-CM

## 2023-12-27 DIAGNOSIS — Z87891 Personal history of nicotine dependence: Secondary | ICD-10-CM

## 2023-12-27 DIAGNOSIS — R972 Elevated prostate specific antigen [PSA]: Secondary | ICD-10-CM | POA: Diagnosis not present

## 2023-12-27 DIAGNOSIS — Z122 Encounter for screening for malignant neoplasm of respiratory organs: Secondary | ICD-10-CM

## 2023-12-27 NOTE — Assessment & Plan Note (Signed)
 Brief PSA elevation to 12 last year during acute GH with clot  -PSA baseline closer to 2-5  -Last PSA 2.6 in September 2025  - Would generally recommend phasing out routine PSA screening at this age.  As he is in otherwise good health we may collect 1 more PSA next year, if stable-discontinue thereafter -PSA in 1 year

## 2023-12-30 ENCOUNTER — Other Ambulatory Visit: Payer: Self-pay

## 2023-12-30 LAB — MICROSCOPIC EXAMINATION

## 2023-12-30 LAB — URINALYSIS, COMPLETE
Bilirubin, UA: NEGATIVE
Glucose, UA: NEGATIVE
Ketones, UA: NEGATIVE
Leukocytes,UA: NEGATIVE
Nitrite, UA: NEGATIVE
Specific Gravity, UA: 1.02 (ref 1.005–1.030)
Urobilinogen, Ur: 0.2 mg/dL (ref 0.2–1.0)
pH, UA: 6.5 (ref 5.0–7.5)

## 2023-12-30 MED ORDER — TAMSULOSIN HCL 0.4 MG PO CAPS: 0.4000 mg | ORAL_CAPSULE | Freq: Every day | ORAL | 11 refills | Status: AC

## 2024-01-02 ENCOUNTER — Other Ambulatory Visit: Payer: Self-pay | Admitting: Acute Care

## 2024-01-02 DIAGNOSIS — Z122 Encounter for screening for malignant neoplasm of respiratory organs: Secondary | ICD-10-CM

## 2024-01-02 DIAGNOSIS — Z87891 Personal history of nicotine dependence: Secondary | ICD-10-CM

## 2024-01-28 ENCOUNTER — Encounter (HOSPITAL_COMMUNITY): Payer: Self-pay | Admitting: Orthopedic Surgery

## 2024-02-15 ENCOUNTER — Other Ambulatory Visit: Payer: Self-pay | Admitting: Cardiology

## 2024-02-17 ENCOUNTER — Other Ambulatory Visit: Payer: Self-pay | Admitting: Cardiology

## 2024-03-11 ENCOUNTER — Ambulatory Visit: Admitting: Cardiology

## 2024-09-29 ENCOUNTER — Encounter: Admitting: Nurse Practitioner

## 2024-10-13 ENCOUNTER — Ambulatory Visit

## 2024-10-14 ENCOUNTER — Ambulatory Visit

## 2024-12-25 ENCOUNTER — Ambulatory Visit: Admitting: Urology
